# Patient Record
Sex: Female | Born: 1990
Health system: Southern US, Community
[De-identification: ages and names within clinical notes are randomized; demographics above are authoritative.]

## PROBLEM LIST (undated history)

## (undated) ENCOUNTER — Emergency Department (HOSPITAL_COMMUNITY): Admission: EM | Payer: Medicaid Other | Source: Home / Self Care

## (undated) DIAGNOSIS — G479 Sleep disorder, unspecified: Secondary | ICD-10-CM

## (undated) DIAGNOSIS — R569 Unspecified convulsions: Secondary | ICD-10-CM

## (undated) DIAGNOSIS — G40909 Epilepsy, unspecified, not intractable, without status epilepticus: Secondary | ICD-10-CM

## (undated) DIAGNOSIS — G43909 Migraine, unspecified, not intractable, without status migrainosus: Secondary | ICD-10-CM

## (undated) DIAGNOSIS — L700 Acne vulgaris: Secondary | ICD-10-CM

## (undated) DIAGNOSIS — F329 Major depressive disorder, single episode, unspecified: Secondary | ICD-10-CM

## (undated) HISTORY — DX: Major depressive disorder, single episode, unspecified: F32.9

## (undated) HISTORY — DX: Migraine, unspecified, not intractable, without status migrainosus: G43.909

## (undated) HISTORY — DX: Acne vulgaris: L70.0

## (undated) HISTORY — DX: Sleep disorder, unspecified: G47.9

---
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
## 2011-03-19 ENCOUNTER — Inpatient Hospital Stay (INDEPENDENT_AMBULATORY_CARE_PROVIDER_SITE_OTHER)
Admission: RE | Admit: 2011-03-19 | Discharge: 2011-03-19 | Disposition: A | Discharge status: Home / Self Care | Payer: Self-pay | Source: Ambulatory Visit | Attending: Emergency Medicine | Admitting: Emergency Medicine

## 2011-03-19 DIAGNOSIS — K5289 Other specified noninfective gastroenteritis and colitis: Secondary | ICD-10-CM

## 2011-09-14 ENCOUNTER — Emergency Department (INDEPENDENT_AMBULATORY_CARE_PROVIDER_SITE_OTHER)
Admission: EM | Admit: 2011-09-14 | Discharge: 2011-09-14 | Disposition: A | Discharge status: Home / Self Care | Payer: Self-pay | Source: Home / Self Care | Attending: Family Medicine | Admitting: Family Medicine

## 2011-09-14 ENCOUNTER — Encounter (HOSPITAL_COMMUNITY): Payer: Self-pay | Admitting: Cardiology

## 2011-09-14 DIAGNOSIS — R05 Cough: Secondary | ICD-10-CM

## 2011-09-14 DIAGNOSIS — J020 Streptococcal pharyngitis: Secondary | ICD-10-CM

## 2011-09-14 MED ORDER — FEXOFENADINE-PSEUDOEPHED ER 180-240 MG PO TB24: 1.0000 | ORAL_TABLET | Freq: Every day | ORAL | Status: DC

## 2011-09-14 MED ORDER — PENICILLIN G BENZATHINE 1200000 UNIT/2ML IM SUSP
1.2000 10*6.[IU] | Freq: Once | INTRAMUSCULAR | Status: AC
  Administered 2011-09-14: 1.2 10*6.[IU] via INTRAMUSCULAR

## 2011-09-14 MED ORDER — PENICILLIN G BENZATHINE 1200000 UNIT/2ML IM SUSP
INTRAMUSCULAR | Status: AC
  Filled 2011-09-14: qty 2

## 2011-09-14 NOTE — Discharge Instructions (Signed)
Salt Water Gargle This solution will help make your mouth and throat feel better. HOME CARE INSTRUCTIONS   Mix 1 teaspoon of salt in 8 ounces of warm water.   Gargle with this solution as much or often as you need or as directed. Swish and gargle gently if you have any sores or wounds in your mouth.   Do not swallow this mixture.  Document Released: 04/04/2004 Document Revised: 03/13/2011 Document Reviewed: 08/26/2008 [facility] Patient Information 2012 [place], [place].Strep Infections Streptococcal (strep) infections are caused by streptococcal germs (bacteria). Strep infections are very contagious. Strep infections can occur in:  Ears.   The nose.   The throat.   Sinuses.   Skin.   Blood.   Lungs.   Spinal fluid.   Urine.  Strep throat is the most common bacterial infection in children. The symptoms of a Strep infection usually get better in 2 to 3 days after starting medicine that kills germs (antibiotics). Strep is usually not contagious after 36 to 48 hours of antibiotic treatment. Strep infections that are not treated can cause serious complications. These include gland infections, throat abscess, rheumatic fever and kidney disease. DIAGNOSIS  The diagnosis of strep is made by:  A culture for the strep germ.  TREATMENT  These infections require oral antibiotics for a full 10 days, an antibiotic shot or antibiotics given into the vein (intravenous, IV). HOME CARE INSTRUCTIONS   Be sure to finish all antibiotics even if feeling better.   Only take over-the-counter medicines for pain, discomfort and or fever, as directed by your caregiver.   Close contacts that have a fever, sore throat or illness symptoms should see their caregiver right away.   You or your child may return to work, school or daycare if the fever and pain are better in 2 to 3 days after starting antibiotics.  SEEK MEDICAL CARE IF:   You or your child has an oral temperature above 102 F (38.9 C).     Your baby is older than 3 months with a rectal temperature of 100.5 F (38.1 C) or higher for more than 1 day.   You or your child is not better in 3 days.  SEEK IMMEDIATE MEDICAL CARE IF:   You or your child has an oral temperature above 102 F (38.9 C), not controlled by medicine.   Your baby is older than 3 months with a rectal temperature of 102 F (38.9 C) or higher.   Your baby is 15 months old or younger with a rectal temperature of 100.4 F (38 C) or higher.   There is a spreading rash.   There is difficulty swallowing or breathing.   There is increased pain or swelling.  Document Released: 08/08/2004 Document Revised: 03/13/2011 Document Reviewed: 05/17/2009 [facility] Patient Information 2012 [place], [place].

## 2011-09-14 NOTE — ED Provider Notes (Signed)
History     CSN: [id number]  Arrival date & time 09/14/11  0910   First MD Initiated Contact with Patient 09/14/11 0913      Chief Complaint  Patient presents with  . Sore Throat  . Cough  . Nasal Congestion    (Consider location/radiation/quality/duration/timing/severity/associated sxs/prior treatment) Patient is a 21 y.o. female presenting with pharyngitis and cough. The history is provided by the patient.  Sore Throat This is a new problem. The current episode started more than 2 days ago (Tuesday night). Episode frequency: worse at night while laying down. The problem has been gradually worsening. Pertinent negatives include no chest pain, no abdominal pain, no headaches and no shortness of breath. Exacerbated by: laying down. The symptoms are relieved by nothing. Treatments tried: OTC nyquil  and dayquil. The treatment provided no relief.  Cough This is a new problem. The current episode started more than 2 days ago. The problem has not changed since onset.The cough is productive of purulent sputum. There has been no fever. Associated symptoms include sore throat. Pertinent negatives include no chest pain, no chills, no sweats, no ear congestion, no ear pain, no headaches, no rhinorrhea, no myalgias, no shortness of breath, no wheezing and no eye redness. She is not a smoker. Her past medical history is significant for asthma. Her past medical history does not include bronchitis, pneumonia, bronchiectasis, COPD or emphysema.    History reviewed. No pertinent past medical history.  History reviewed. No pertinent past surgical history.  No family history on file.  History  Substance Use Topics  . Smoking status: Not on file  . Smokeless tobacco: Not on file  . Alcohol Use: Not on file    OB History    Grav Para Term Preterm Abortions TAB SAB Ect Mult Living                  Review of Systems  Constitutional: Negative for chills.  HENT: Positive for sore throat. Negative  for ear pain and rhinorrhea.   Eyes: Negative for redness.  Respiratory: Positive for cough. Negative for shortness of breath and wheezing.   Cardiovascular: Negative for chest pain.  Gastrointestinal: Negative for abdominal pain.  Musculoskeletal: Negative for myalgias.  Neurological: Negative for headaches.  All other systems reviewed and are negative.    Allergies  Review of patient's allergies indicates no known allergies.  Home Medications  No current outpatient prescriptions on file.  BP 113/75  Pulse 85  Temp(Src) 98.4 F (36.9 C) (Oral)  SpO2 100%  LMP 09/04/2011  Physical Exam  Nursing note and vitals reviewed. Constitutional: She is oriented to person, place, and time. She appears well-developed and well-nourished. No distress.  HENT:  Head: Normocephalic and atraumatic.  Right Ear: Tympanic membrane normal.  Left Ear: Tympanic membrane normal.  Nose: Nose normal.  Mouth/Throat: No dental caries. Posterior oropharyngeal edema and posterior oropharyngeal erythema present. No oropharyngeal exudate.  Eyes: Right eye exhibits no discharge. Left eye exhibits no discharge. No scleral icterus.  Neck: Neck supple.  Cardiovascular: Normal rate, regular rhythm and normal heart sounds.   Pulmonary/Chest: Effort normal. No respiratory distress. She has no wheezes. She has rhonchi. She has no rales.  Lymphadenopathy:    She has cervical adenopathy.  Neurological: She is alert and oriented to person, place, and time.  Skin: Skin is warm and dry.  Psychiatric: She has a normal mood and affect.    ED Course  Procedures (including critical care time)  Results for orders placed during the hospital encounter of 09/14/11  POCT RAPID STREP A ([facility]RG CARE ONLY)      Component Value Range   Streptococcus, Group A Screen (Direct) POSITIVE (*) NEGATIVE        MDM          Hassan Rowan, MD 09/14/11 1039

## 2011-09-14 NOTE — ED Notes (Addendum)
Pt states sore throat, nasal congestion and dry cough that started this past Tuesday. Denies fever. Tolerating liquids decreased appetite. Has tried multiple OTC products with no relief.  Theraflu, cough drops, and nyquil.

## 2012-01-06 ENCOUNTER — Emergency Department (HOSPITAL_COMMUNITY): Admission: EM | Admit: 2012-01-06 | Discharge: 2012-01-06 | Payer: Self-pay | Source: Home / Self Care

## 2012-06-19 ENCOUNTER — Emergency Department (HOSPITAL_COMMUNITY): Payer: Medicaid Other

## 2012-06-19 ENCOUNTER — Encounter (HOSPITAL_COMMUNITY): Payer: Self-pay | Admitting: Emergency Medicine

## 2012-06-19 ENCOUNTER — Emergency Department (HOSPITAL_COMMUNITY)
Admission: EM | Admit: 2012-06-19 | Discharge: 2012-06-19 | Disposition: A | Discharge status: Home / Self Care | Payer: Medicaid Other | Attending: Emergency Medicine | Admitting: Emergency Medicine

## 2012-06-19 DIAGNOSIS — F29 Unspecified psychosis not due to a substance or known physiological condition: Secondary | ICD-10-CM | POA: Insufficient documentation

## 2012-06-19 DIAGNOSIS — R569 Unspecified convulsions: Secondary | ICD-10-CM

## 2012-06-19 DIAGNOSIS — R42 Dizziness and giddiness: Secondary | ICD-10-CM | POA: Insufficient documentation

## 2012-06-19 LAB — URINALYSIS, ROUTINE W REFLEX MICROSCOPIC
Protein, ur: 30 mg/dL — AB
Urobilinogen, UA: 0.2 mg/dL (ref 0.0–1.0)

## 2012-06-19 LAB — CBC WITH DIFFERENTIAL/PLATELET
Basophils Absolute: 0 10*3/uL (ref 0.0–0.1)
Eosinophils Absolute: 0 10*3/uL (ref 0.0–0.7)
Eosinophils Relative: 0 % (ref 0–5)
MCH: 32.6 pg (ref 26.0–34.0)
MCV: 96 fL (ref 78.0–100.0)
Platelets: 292 10*3/uL (ref 150–400)
RDW: 12.6 % (ref 11.5–15.5)

## 2012-06-19 LAB — BASIC METABOLIC PANEL
Calcium: 9.9 mg/dL (ref 8.4–10.5)
GFR calc non Af Amer: >90 mL/min (ref >90)
Sodium: 137 mEq/L (ref 135–145)

## 2012-06-19 LAB — GLUCOSE, CAPILLARY: Glucose-Capillary: 97 mg/dL (ref 70–99)

## 2012-06-19 LAB — RAPID URINE DRUG SCREEN, HOSP PERFORMED
Opiates: NOT DETECTED
Tetrahydrocannabinol: POSITIVE — AB

## 2012-06-19 LAB — POCT PREGNANCY, URINE: Preg Test, Ur: NEGATIVE

## 2012-06-19 LAB — URINE MICROSCOPIC-ADD ON

## 2012-06-19 MED ORDER — CEPHALEXIN 500 MG PO CAPS: 500.0000 mg | ORAL_CAPSULE | Freq: Two times a day (BID) | ORAL | Status: DC

## 2012-06-19 MED ORDER — SODIUM CHLORIDE 0.9 % IV SOLN
1000.0000 mg | Freq: Once | INTRAVENOUS | Status: AC
  Administered 2012-06-19: 1000 mg via INTRAVENOUS
  Filled 2012-06-19: qty 10

## 2012-06-19 MED ORDER — DEXTROSE 5 % IV SOLN
1.0000 g | INTRAVENOUS | Status: DC
  Administered 2012-06-19: 1 g via INTRAVENOUS
  Filled 2012-06-19: qty 10

## 2012-06-19 MED ORDER — LEVETIRACETAM 500 MG PO TABS: 500.0000 mg | ORAL_TABLET | Freq: Two times a day (BID) | ORAL | Status: DC

## 2012-06-19 NOTE — ED Notes (Signed)
PT presents to ED via EMS after having witnessed seizure. Family witnessed seizure, full body, incont of urine. NAD.

## 2012-06-19 NOTE — Progress Notes (Signed)
EEG COMPLETED

## 2012-06-19 NOTE — Consult Note (Addendum)
Reason for Consult:Seizure Referring Physician: Bernette Mayers  CC: Seizure  HPI: Lisa Shaw is an 21 y.o. female that reports that over the past few months has had episodes where she just stares off into space.  She knows when she has an episode because she gets a warning that precedes the event every time.  This premonitory symptom is the same every time.  Prior to a few months ago she does not recall ever having these episodes.  Patient unable to recall the events of this morning but it seems that not long after awakening she experienced a generalized tonic-clonic seizure.  There was associated tongue biting and urinary incontinence.  Patient was brought to the ED for further evaluation.   Patient reports no history of head trauma, meningitis or encephalitis.  No prior history of seizures.    Past medical history: Noncontributory   Past surgical history: None  Family history: maternal great grandmother with a history of seizures.  Now deceased.    Social History:  Patient admits to no history of alcohol, tobacco or illicit drug abuse.  Drug screen positive for THC.  Works as a Conservation officer, nature.  No Known Allergies  Medications: None at home  ROS: History obtained from the patient  General ROS: negative for - chills, fatigue, fever, night sweats, weight gain or weight loss Psychological ROS: negative for - behavioral disorder, hallucinations, memory difficulties, mood swings or suicidal ideation Ophthalmic ROS: negative for - blurry vision, double vision, eye pain or loss of vision ENT ROS: negative for - epistaxis, nasal discharge, oral lesions, sore throat, tinnitus or vertigo Allergy and Immunology ROS: negative for - hives or itchy/watery eyes Hematological and Lymphatic ROS: negative for - bleeding problems, bruising or swollen lymph nodes Endocrine ROS: negative for - galactorrhea, hair pattern changes, polydipsia/polyuria or temperature intolerance Respiratory ROS: negative for -  cough, hemoptysis, shortness of breath or wheezing Cardiovascular ROS: negative for - chest pain, dyspnea on exertion, edema or irregular heartbeat Gastrointestinal ROS: negative for - abdominal pain, diarrhea, hematemesis, nausea/vomiting or stool incontinence Genito-Urinary ROS: negative for - dysuria, hematuria, incontinence or urinary frequency/urgency Musculoskeletal ROS: negative for - joint swelling or muscular weakness Neurological ROS: as noted in HPI Dermatological ROS: negative for rash and skin lesion changes  Physical Examination: Blood pressure 107/66, pulse 82, temperature 98.9 F (37.2 C), temperature source Oral, resp. rate 22, height 5\' 6"  (1.676 m), last menstrual period 05/21/2012, SpO2 100.00%.  Neurologic Examination Mental Status: Alert, oriented, thought content appropriate.  Speech fluent without evidence of aphasia.  Able to follow 3 step commands without difficulty. Cranial Nerves: II: Discs flat bilaterally; Visual fields grossly normal, pupils equal, round, reactive to light and accommodation III,IV, VI: ptosis not present, extra-ocular motions intact bilaterally V,VII: smile symmetric, facial light touch sensation normal bilaterally VIII: hearing normal bilaterally IX,X: gag reflex present XI: bilateral shoulder shrug XII: midline tongue extension Motor: Right : Upper extremity   5/5    Left:     Upper extremity   5/5  Lower extremity   5/5     Lower extremity   5/5 Tone and bulk:normal tone throughout; no atrophy noted Sensory: Pinprick and light touch intact throughout, bilaterally Deep Tendon Reflexes: 1+ and symmetric throughout Plantars: Right: downgoing   Left: downgoing Cerebellar: normal finger-to-nose, normal rapid alternating movements and normal heel-to-shin test Gait: not tested CV: pulses palpable throughout   Laboratory Studies:   Basic Metabolic Panel:  Lab 06/19/12 1610  NA 137  K 4.0  CL 101  CO2 25  GLUCOSE 89  BUN 8   CREATININE 0.71  CALCIUM 9.9  MG 2.1  PHOS --    Liver Function Tests: No results found for this basename: AST:5,ALT:5,ALKPHOS:5,BILITOT:5,PROT:5,ALBUMIN:5 in the last 168 hours No results found for this basename: LIPASE:5,AMYLASE:5 in the last 168 hours No results found for this basename: AMMONIA:3 in the last 168 hours  CBC:  Lab 06/19/12 0828  WBC 6.2  NEUTROABS 4.2  HGB 13.9  HCT 41.0  MCV 96.0  PLT 292    Cardiac Enzymes: No results found for this basename: CKTOTAL:5,CKMB:5,CKMBINDEX:5,TROPONINI:5 in the last 168 hours  BNP: No components found with this basename: POCBNP:5  CBG:  Lab 06/19/12 1222 06/19/12 0905  GLUCAP 86 97    Microbiology: No results found for this or any previous visit.  Coagulation Studies: No results found for this basename: LABPROT:5,INR:5 in the last 72 hours  Urinalysis:  Lab 06/19/12 0912  COLORURINE YELLOW  LABSPEC 1.018  PHURINE 6.0  GLUCOSEU NEGATIVE  HGBUR TRACE*  BILIRUBINUR NEGATIVE  KETONESUR NEGATIVE  PROTEINUR 30*  UROBILINOGEN 0.2  NITRITE POSITIVE*  LEUKOCYTESUR LARGE*    Lipid Panel:  No results found for this basename: chol, trig, hdl, cholhdl, vldl, ldlcalc    HgbA1C:  No results found for this basename: HGBA1C    Urine Drug Screen:     Component Value Date/Time   LABOPIA NONE DETECTED 06/19/2012 0912   COCAINSCRNUR NONE DETECTED 06/19/2012 0912   LABBENZ NONE DETECTED 06/19/2012 0912   AMPHETMU NONE DETECTED 06/19/2012 0912   THCU POSITIVE* 06/19/2012 0912   LABBARB NONE DETECTED 06/19/2012 0912    Alcohol Level: No results found for this basename: ETH:2 in the last 168 hours  Imaging: Ct Head Wo Contrast  06/19/2012  *RADIOLOGY REPORT*  Clinical Data: Seizure  CT HEAD WITHOUT CONTRAST  Technique:  Contiguous axial images were obtained from the base of the skull through the vertex without contrast.  Comparison: None.  Findings:  The ventricles are rather prominent diffusely for age. Sulci appear  normal.  There is no mass, hemorrhage, extra-axial fluid collection, or midline shift.  Gray-white compartments are normal.  Bony calvarium appears intact.  The mastoid air cells are clear.  IMPRESSION: Overall, the ventricles are rather prominent for age.  Study otherwise unremarkable.   Original Report Authenticated By: Bretta Bang, M.D.      Assessment/Plan: 21 year old female presenting after a generalized T/C seizure associated with tongue biting and urinary incontinence.  Head CT unremarkable.  Exam nonfocal.  It does seem that the patient has been having seizures even prior to now and reports a frequency of about 2-3 per week.  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  She will require treatment and further work up.    Recommendations: 1.  EEG today 2.  Keppra 1000mg  now.  To be placed on a maintenance of 500mg  BID at discharge 3.  MRI of the brain without contrast 4.  Follow up with outpatient neurologist.  Patient may be discharged from the ED to home after above testing.  Patient unable to drive, operate heavy machinery, perform activities at heights and participate in water activities until release by outpatient physician.  Patient given some contraceptive counseling as well.  Thana Farr, MD [facility] [phone] 06/19/2012, 2:12 PM   Addendum: EEG reviewed and shows focal left frontal sharp transients.   Will continue with above plan.  Thana Farr, MD [facility] [phone]

## 2012-06-19 NOTE — ED Notes (Signed)
Patient transported to MRI 

## 2012-06-19 NOTE — ED Provider Notes (Signed)
Patient discussed with Dr. Rhunette Croft.  Patient has already been seen and evaluated by neurology for seizures, with recommendation for keppra 500 mg bid and out-patient neurology follow-up.  Patient currently awake, well-oriented, neuro exam grossly normal.  Treatment plan discussed with patient and family.  Jimmye Norman, NP 06/19/12 [phone]

## 2012-06-19 NOTE — ED Notes (Signed)
Pt returned from MRI °

## 2012-06-19 NOTE — ED Provider Notes (Addendum)
History     CSN: [id number]  Arrival date & time 06/19/12  4782   First MD Initiated Contact with Patient 06/19/12 0802      Chief Complaint  Patient presents with  . Seizures    (Consider location/radiation/quality/duration/timing/severity/associated sxs/prior treatment) HPI Comments: Patient presents with witnessed seizure this morning. Seizure occurred shortly after 6:00 AM. Patient remembers waking up and looking at the clock. Her next memory is on an ambulance. Patient was seen to have a full-body shaking seizure. She did bite her tongue and was incontinent of urine. She was very confused after waking up. Patient does not have a history of seizures as a child or adult. Over the past 6 months the patient has had spells approximately once a week where she blanked out and sometimes will have twitching of her hand or arm. She has not sought evaluation for this. Otherwise her health is good. She denies fevers or neck pain. She states that she has migraine headaches that are unilateral approximately twice a week. She had a typical migraine 2 days ago. She denies cold symptoms, chest pain, abdominal pain, shortness of breath, nausea, vomiting, diarrhea, skin changes, urinary symptoms. She did not hit her head recently. History of seizures in a great aunt. She denies drug or alcohol use. Occasional cigarette. Onset acute. Course is resolved. Nothing makes symptoms better or worse.  The history is provided by the patient.    History reviewed. No pertinent past medical history.  History reviewed. No pertinent past surgical history.  History reviewed. No pertinent family history.  History  Substance Use Topics  . Smoking status: Not on file  . Smokeless tobacco: Not on file  . Alcohol Use: Not on file    OB History    Grav Para Term Preterm Abortions TAB SAB Ect Mult Living                  Review of Systems  Constitutional: Negative for fever and fatigue.  HENT: Negative for neck  pain and tinnitus.   Eyes: Negative for photophobia, pain and visual disturbance.  Respiratory: Negative for shortness of breath.   Cardiovascular: Negative for chest pain.  Gastrointestinal: Negative for nausea and vomiting.  Musculoskeletal: Negative for back pain and gait problem.  Skin: Negative for wound.  Neurological: Positive for seizures and headaches. Negative for dizziness, weakness, light-headedness and numbness.  Psychiatric/Behavioral: Positive for confusion. Negative for decreased concentration.    Allergies  Review of patient's allergies indicates no known allergies.  Home Medications  No current outpatient prescriptions on file.  BP 121/63  Pulse 103  Temp 98.9 F (37.2 C) (Oral)  Resp 18  Ht 5\' 6"  (1.676 m)  SpO2 100%  Physical Exam  Nursing note and vitals reviewed. Constitutional: She is oriented to person, place, and time. She appears well-developed and well-nourished.  HENT:  Head: Normocephalic and atraumatic. Head is without raccoon's eyes and without Battle's sign.  Right Ear: Tympanic membrane, external ear and ear canal normal. No hemotympanum.  Left Ear: Tympanic membrane, external ear and ear canal normal. No hemotympanum.  Nose: Nose normal. No nasal septal hematoma.  Mouth/Throat: Uvula is midline, oropharynx is clear and moist and mucous membranes are normal. Lacerations (lateral tongue abraded bilaterally ) present.  Eyes: Conjunctivae normal, EOM and lids are normal. Pupils are equal, round, and reactive to light. Right eye exhibits no nystagmus. Left eye exhibits no nystagmus.  Neck: Normal range of motion. Neck supple.  Cardiovascular: Normal rate and  regular rhythm.   Pulmonary/Chest: Effort normal and breath sounds normal.  Abdominal: Soft. There is no tenderness.  Musculoskeletal:       Cervical back: She exhibits normal range of motion, no tenderness and no bony tenderness.       Thoracic back: She exhibits no tenderness and no bony  tenderness.       Lumbar back: She exhibits no tenderness and no bony tenderness.  Neurological: She is alert and oriented to person, place, and time. She has normal strength and normal reflexes. No cranial nerve deficit or sensory deficit. She exhibits normal muscle tone. She displays a negative Romberg sign. She displays no seizure activity. Coordination normal. GCS eye subscore is 4. GCS verbal subscore is 5. GCS motor subscore is 6.  Skin: Skin is warm and dry.  Psychiatric: She has a normal mood and affect.    ED Course  Procedures (including critical care time)  Labs Reviewed  URINALYSIS, ROUTINE W REFLEX MICROSCOPIC - Abnormal; Notable for the following:    APPearance CLOUDY (*)     Hgb urine dipstick TRACE (*)     Protein, ur 30 (*)     Nitrite POSITIVE (*)     Leukocytes, UA LARGE (*)     All other components within normal limits  URINE RAPID DRUG SCREEN (HOSP PERFORMED) - Abnormal; Notable for the following:    Tetrahydrocannabinol POSITIVE (*)     All other components within normal limits  URINE MICROSCOPIC-ADD ON - Abnormal; Notable for the following:    Squamous Epithelial / LPF FEW (*)     Bacteria, UA MANY (*)     All other components within normal limits  CBC WITH DIFFERENTIAL  BASIC METABOLIC PANEL  MAGNESIUM  GLUCOSE, CAPILLARY  POCT PREGNANCY, URINE  URINE CULTURE   Ct Head Wo Contrast  06/19/2012  *RADIOLOGY REPORT*  Clinical Data: Seizure  CT HEAD WITHOUT CONTRAST  Technique:  Contiguous axial images were obtained from the base of the skull through the vertex without contrast.  Comparison: None.  Findings:  The ventricles are rather prominent diffusely for age. Sulci appear normal.  There is no mass, hemorrhage, extra-axial fluid collection, or midline shift.  Gray-white compartments are normal.  Bony calvarium appears intact.  The mastoid air cells are clear.  IMPRESSION: Overall, the ventricles are rather prominent for age.  Study otherwise unremarkable.    Original Report Authenticated By: Bretta Bang, M.D.      1. Seizure     8:23 AM Patient seen and examined. Work-up initiated. D/w Dr. Bernette Mayers.  Vital signs reviewed and are as follows: Filed Vitals:   06/19/12 0759  BP: 121/63  Pulse: 103  Temp: 98.9 F (37.2 C)  Resp: 18   10:43 AM Spoke with Dr. Thad Ranger who has asked for EEG and will try to arrange prior to discharge.   11:06 AM Will move to CDU pending EEG results.   Handoff Pisciotta PA-C. Plan: Follow up with neuro reccs, likely d/c on lamical vs keppra, info for f/u with [facility], treat UTI.   Date: 06/19/2012  Rate: 102  Rhythm: sinus tachycardia  QRS Axis: normal  Intervals: normal  ST/T Wave abnormalities: nonspecific ST/T changes  Conduction Disutrbances:none  Narrative Interpretation:   Old EKG Reviewed: none available    MDM  Probable seizure disorder. Will move to CDU pending work-up results.         Renne Crigler, [place] 06/19/12 1110  [place], [place] 06/19/12 1231

## 2012-06-19 NOTE — ED Provider Notes (Signed)
Medical screening examination/treatment/procedure(s) were performed by non-physician practitioner and as supervising physician I was immediately available for consultation/collaboration.   Kato Wieczorek B. Bernette Mayers, MD 06/19/12 1110

## 2012-06-19 NOTE — Procedures (Signed)
ELECTROENCEPHALOGRAM REPORT   Patient: Lisa Shaw       Room #: ED EEG No. ID: [id number] Age: 21 y.o.        Sex: female Referring Physician: [place] Report Date:  06/19/2012        Interpreting Physician: Thana Farr D  History: Lisa Shaw is an 21 y.o. female with new onset seizures  Medications: None  Conditions of Recording:  This is a 16 channel EEG carried out with the patient in the awake, drowsy and asleep states.  Description:  The waking background activity consists of a low voltage, symmetrical, fairly well organized, 10 Hz alpha activity, seen from the parieto-occipital and posterior temporal regions.  Low voltage fast activity, poorly organized, is seen anteriorly and is at times superimposed on more posterior regions.  A mixture of theta and alpha rhythms are seen from the central and temporal regions.  Infrequent sharp transients are noted in the left fronto-temporal region. The patient drowses with slowing to irregular, low voltage theta and beta activity.   The patient goes in to a light sleep with symmetrical sleep spindles, vertex central sharp transients and irregular slow activity.  Hyperventilation produced a mild to moderate buildup but failed to elicit any abnormalities.  Intermittent photic stimulation was performed and elicits a symmetrical driving response but fails to elicit any abnormalities.  IMPRESSION: This is an abnormal EEG secondary to focal sharp wave activity in the frontal region on the left.  This finding is suggestive of a focal disturbance with epileptogenic potential.   Thana Farr, MD [facility] [phone] 06/19/2012, 10:34 PM

## 2012-06-19 NOTE — ED Provider Notes (Signed)
Lisa Shaw is a 21 y.o. female transferred to CDU from [facility] A. Signout from [employer] as follows: Patient had onset was witnessed and appear to be a legitimate gram also the seizure. Neurology was consult to Dr. Thad Ranger has ordered an expedited EEG. Plan is to followup the EEG with Dr. Thad Ranger and possibly DC her on Keppra or Lamictal at neurology's recommendation. She will follow with [facility] is an outpatient. Incidentally patient also has a UTI. Patient will be given 1 g IV Rocephin for that.  Pt seen and examined at the bedside she has no complaints and says that she feels "back to normal." Pupils are equal round and reactive to light. Strength is 5 out of 5x4 extremities. Heart is regular rate and rhythm with no murmurs rubs and gallops, lungs are clear to auscultation bilaterally and abdominal exam is benign with mild tenderness to palpation of suprapubic region no rebound or masses.  Patient will be drove given a gram of Rocephin for her UTI.  Neurologic consult from Dr. Thad Ranger appreciated: EEG shows abnormalities that must be followed up with MRI. She will load with oral Keppra and recommends starting Keppra 500 twice a day at discharge. She will need outpatient neurologic care [facility].  Signout given to NP Katrinka Blazing at shift change.     Wynetta Emery, PA-C 06/19/12 1524

## 2012-06-19 NOTE — ED Notes (Signed)
Lunch tray ordered for pt.

## 2012-06-19 NOTE — ED Provider Notes (Signed)
Medical screening examination/treatment/procedure(s) were performed by non-physician practitioner and as supervising physician I was immediately available for consultation/collaboration.   Arcelia Pals B. Bernette Mayers, MD 06/19/12 1601

## 2012-06-19 NOTE — ED Notes (Signed)
Post EEG patient became disoriented and diaphoretic for apprx 1-2 minutes. She was slow in her responses, she was confused during that time. She then was oriented but was still slow in her responses.

## 2012-06-20 NOTE — ED Provider Notes (Signed)
Medical screening examination/treatment/procedure(s) were conducted as a shared visit with non-physician practitioner(s) and myself.  I personally evaluated the patient during the encounter  Newell Frater, MD 06/20/12 0117 

## 2012-06-21 LAB — URINE CULTURE: Colony Count: >=100000

## 2012-06-22 NOTE — ED Notes (Signed)
+   urine Patient treated with keflex-sensitive to same-chart appended per protocol MD. 

## 2012-07-24 ENCOUNTER — Emergency Department (HOSPITAL_COMMUNITY)
Admission: EM | Admit: 2012-07-24 | Discharge: 2012-07-24 | Disposition: A | Discharge status: Home / Self Care | Payer: Medicaid Other | Attending: Emergency Medicine | Admitting: Emergency Medicine

## 2012-07-24 ENCOUNTER — Encounter (HOSPITAL_COMMUNITY): Payer: Self-pay | Admitting: Emergency Medicine

## 2012-07-24 DIAGNOSIS — F17200 Nicotine dependence, unspecified, uncomplicated: Secondary | ICD-10-CM | POA: Insufficient documentation

## 2012-07-24 DIAGNOSIS — G40909 Epilepsy, unspecified, not intractable, without status epilepticus: Secondary | ICD-10-CM | POA: Insufficient documentation

## 2012-07-24 DIAGNOSIS — Z9119 Patient's noncompliance with other medical treatment and regimen: Secondary | ICD-10-CM | POA: Insufficient documentation

## 2012-07-24 DIAGNOSIS — G4700 Insomnia, unspecified: Secondary | ICD-10-CM | POA: Insufficient documentation

## 2012-07-24 DIAGNOSIS — R51 Headache: Secondary | ICD-10-CM | POA: Insufficient documentation

## 2012-07-24 DIAGNOSIS — Z91199 Patient's noncompliance with other medical treatment and regimen due to unspecified reason: Secondary | ICD-10-CM | POA: Insufficient documentation

## 2012-07-24 DIAGNOSIS — R569 Unspecified convulsions: Secondary | ICD-10-CM

## 2012-07-24 DIAGNOSIS — Z3202 Encounter for pregnancy test, result negative: Secondary | ICD-10-CM | POA: Insufficient documentation

## 2012-07-24 HISTORY — DX: Unspecified convulsions: R56.9

## 2012-07-24 LAB — URINALYSIS, ROUTINE W REFLEX MICROSCOPIC
Bilirubin Urine: NEGATIVE
Glucose, UA: NEGATIVE mg/dL
Hgb urine dipstick: NEGATIVE
Ketones, ur: NEGATIVE mg/dL
Specific Gravity, Urine: 1.015 (ref 1.005–1.030)
pH: 7 (ref 5.0–8.0)

## 2012-07-24 LAB — POCT PREGNANCY, URINE: Preg Test, Ur: NEGATIVE

## 2012-07-24 LAB — COMPREHENSIVE METABOLIC PANEL
AST: 14 U/L (ref 0–37)
Albumin: 3.9 g/dL (ref 3.5–5.2)
Alkaline Phosphatase: 81 U/L (ref 39–117)
BUN: 5 mg/dL — ABNORMAL LOW (ref 6–23)
CO2: 25 mEq/L (ref 19–32)
Chloride: 101 mEq/L (ref 96–112)
Creatinine, Ser: 0.58 mg/dL (ref 0.50–1.10)
GFR calc non Af Amer: >90 mL/min (ref >90)
Potassium: 3.7 mEq/L (ref 3.5–5.1)
Total Bilirubin: 0.3 mg/dL (ref 0.3–1.2)

## 2012-07-24 LAB — CBC WITH DIFFERENTIAL/PLATELET
Eosinophils Relative: 0 % (ref 0–5)
HCT: 39.1 % (ref 36.0–46.0)
Hemoglobin: 13.5 g/dL (ref 12.0–15.0)
Lymphocytes Relative: 41 % (ref 12–46)
Lymphs Abs: 2.8 10*3/uL (ref 0.7–4.0)
MCV: 94.7 fL (ref 78.0–100.0)
Monocytes Absolute: 0.5 10*3/uL (ref 0.1–1.0)
Monocytes Relative: 7 % (ref 3–12)
Neutro Abs: 3.6 10*3/uL (ref 1.7–7.7)
RBC: 4.13 MIL/uL (ref 3.87–5.11)
RDW: 12.6 % (ref 11.5–15.5)
WBC: 6.9 10*3/uL (ref 4.0–10.5)

## 2012-07-24 LAB — RAPID URINE DRUG SCREEN, HOSP PERFORMED
Barbiturates: NOT DETECTED
Cocaine: NOT DETECTED
Opiates: NOT DETECTED
Tetrahydrocannabinol: POSITIVE — AB

## 2012-07-24 MED ORDER — LEVETIRACETAM 500 MG PO TABS: 500.0000 mg | ORAL_TABLET | Freq: Two times a day (BID) | ORAL | Status: DC

## 2012-07-24 MED ORDER — SODIUM CHLORIDE 0.9 % IV SOLN
1000.0000 mg | Freq: Once | INTRAVENOUS | Status: AC
  Administered 2012-07-24: 1000 mg via INTRAVENOUS
  Filled 2012-07-24: qty 10

## 2012-07-24 NOTE — ED Provider Notes (Signed)
History     CSN: [id number]  Arrival date & time 07/24/12  1128   First MD Initiated Contact with Patient 07/24/12 1222      Chief Complaint  Patient presents with  . Seizures    (Consider location/radiation/quality/duration/timing/severity/associated sxs/prior treatment) HPI Comments: Lisa Shaw is a 22 year old female w a PMHx of TC seizures who presented with a complaint of unwittnessed seizure last night.  At approximately 1:40 in the morning she awoke and her finger was twitching and she felt weird.  After this episode, she fell back asleep and awoke again at 5 in the morning with a headache.  She denies any urinary incontinence or tongue biting.  She doesn't remember the episode very well, and LOC is unknown. In addition pt reports difficulty sleeping adn night time insomnia x 1 week, which is new.  In December she was diagnosed with TC seizures (abnormal EEG, no findings on MRI per Dr. Thad Ranger)  and prescribed Keppra.  Last Sunday she ran out of her Keppra prescription and was not able to refill it due to waiting on her Medicaid card. Pt was to f-u with [facility], but to ins issues has no yet seen them, apt scheduled for Feb. 12th.  She has not taken Keppra since Sunday.  She has no other medical problems and has had no recent illness or trauma.  She denies any drug use or any recent exposure to toxins.  She denies pregnancy.    Patient is a 22 y.o. female presenting with seizures. The history is provided by the patient and medical records.  Seizures     Past Medical History  Diagnosis Date  . Seizures     History reviewed. No pertinent past surgical history.  No family history on file.  History  Substance Use Topics  . Smoking status: Current Every Day Smoker -- 3.0 packs/day  . Smokeless tobacco: Not on file  . Alcohol Use: No    OB History    Grav Para Term Preterm Abortions TAB SAB Ect Mult Living                  Review of Systems    Neurological: Positive for seizures.  All other systems reviewed and are negative.    Allergies  Review of patient's allergies indicates no known allergies.  Home Medications   Current Outpatient Rx  Name  Route  Sig  Dispense  Refill  . LEVETIRACETAM 500 MG PO TABS   Oral   Take 1 tablet (500 mg total) by mouth every 12 (twelve) hours.   60 tablet   0     BP 101/61  Pulse 94  Temp 99 F (37.2 C) (Oral)  Resp 18  SpO2 100%  LMP 06/25/2012  Physical Exam  Nursing note and vitals reviewed. Constitutional: She appears well-developed and well-nourished. No distress.       Not post ictal, normotensive  HENT:  Head: Normocephalic.       Atraumatic, No evidence of tongue oral lacerations  Eyes: EOM are normal. Pupils are equal, round, and reactive to light.  Neck: Normal range of motion. Neck supple.       Cervical spinous process non tender without step offs, no difficulty or pain with flexion or extension of neck  Cardiovascular: Normal rate, regular rhythm, normal heart sounds and intact distal pulses.   Pulmonary/Chest: Breath sounds normal. No respiratory distress. She has no wheezes. She has no rales.  Abdominal: Soft. There is  no tenderness.  Musculoskeletal: She exhibits no edema and no tenderness.       Full active & passive ROM of arms bilaterally  Neurological:       CN III-VII intact. Iintact coordination, sensation, and motor (finger grip, biceps, hamstrings & dorsiflexion). No pass pointing, good rapid coordination. Gait normal.   Skin: Skin is warm and dry. She is not diaphoretic.       intact    ED Course  Procedures (including critical care time)  Labs Reviewed  URINALYSIS, ROUTINE W REFLEX MICROSCOPIC - Abnormal; Notable for the following:    APPearance CLOUDY (*)     All other components within normal limits  COMPREHENSIVE METABOLIC PANEL - Abnormal; Notable for the following:    BUN 5 (*)     All other components within normal limits  CBC WITH  DIFFERENTIAL  ETHANOL  POCT PREGNANCY, URINE  URINE RAPID DRUG SCREEN (HOSP PERFORMED)   No results found.   No diagnosis found.  The patient denies any neck pain. There is no tenderness on palpation of the cervical spine and no step-offs. The patient can look to the left and right voluntarily without pain and flex and extend the neck without pain. Cervical collar cleared.    MDM  Seizure, medication noncompliance  Patient is a 22 year old female that presents emergency department after having a seizure last evening.  She reports one week of medication noncompliance do to inability to followup with [facility] neurology until February 12.  No focal neuro deficits seen on exam.  Cervical spine cleared.  Labs reviewed without acute abnormalities and no imaging indicated at this time.  Patient has been advised to keep her followup appointment that has been scheduled with [facility] neurology.  Keppra loading dose given while in the emergency department and patient will be discharged with prescription.  Strict return precautions discussed.  At this time there does not appear to be any evidence of an acute emergency medical condition and the patient appears stable for discharge with appropriate outpatient follow up. Diagnosis was discussed with patient who verbalizes understanding and is agreeable to discharge.          Jaci Carrel, [place] 07/24/12 1411

## 2012-07-24 NOTE — ED Notes (Signed)
Pt presenting to ed with c/o seizure last night pt states she has been out of her medications x 1 week pt states she has been waiting on her medicaid card.

## 2012-07-28 NOTE — ED Provider Notes (Signed)
Medical screening examination/treatment/procedure(s) were performed by non-physician practitioner and as supervising physician I was immediately available for consultation/collaboration.  Gilda Crease, MD 07/28/12 [phone]

## 2012-12-02 ENCOUNTER — Emergency Department (HOSPITAL_COMMUNITY)
Admission: EM | Admit: 2012-12-02 | Discharge: 2012-12-02 | Disposition: A | Discharge status: Home / Self Care | Payer: Medicaid Other | Source: Home / Self Care

## 2012-12-02 ENCOUNTER — Encounter (HOSPITAL_COMMUNITY): Payer: Self-pay | Admitting: Emergency Medicine

## 2012-12-02 DIAGNOSIS — K5289 Other specified noninfective gastroenteritis and colitis: Secondary | ICD-10-CM

## 2012-12-02 DIAGNOSIS — K529 Noninfective gastroenteritis and colitis, unspecified: Secondary | ICD-10-CM

## 2012-12-02 HISTORY — DX: Epilepsy, unspecified, not intractable, without status epilepticus: G40.909

## 2012-12-02 MED ORDER — ONDANSETRON 4 MG PO TBDP: 4.0000 mg | ORAL_TABLET | Freq: Three times a day (TID) | ORAL | Status: DC | PRN

## 2012-12-02 MED ORDER — ONDANSETRON 4 MG PO TBDP: 4.0000 mg | ORAL_TABLET | Freq: Four times a day (QID) | ORAL | Status: DC | PRN

## 2012-12-02 NOTE — ED Provider Notes (Signed)
History     CSN: [id number]  Arrival date & time 12/02/12  1652   None     Chief Complaint  Patient presents with  . Influenza    vomiting and diarrhea since 5/19 denies fever.     (Consider location/radiation/quality/duration/timing/severity/associated sxs/prior treatment) HPI Comments: Pt presents c/o NVD and cramping abdominal pain since Monday 2 days ago.  Started with diarrhea and progressed to N/V.  She though she was getting better but threw up today after eating lunch so decided to come in.  She also states she started feeling a little bit dizzy today and that she is still feeling dizzy now.  She has not taken anything to help her symptoms.  Denies any sick contacts.  No fever, chills, SOB.    Patient is a 21 y.o. female presenting with flu symptoms.  Influenza Presenting symptoms: fatigue     Past Medical History  Diagnosis Date  . Seizures   . Epilepsy     History reviewed. No pertinent past surgical history.  History reviewed. No pertinent family history.  History  Substance Use Topics  . Smoking status: Current Every Day Smoker -- 3.00 packs/day  . Smokeless tobacco: Not on file  . Alcohol Use: No    OB History   Grav Para Term Preterm Abortions TAB SAB Ect Mult Living                  Review of Systems  Constitutional: Positive for fatigue.  HENT: Negative.   Eyes: Negative.   Respiratory: Negative.   Cardiovascular: Negative.   Gastrointestinal: Negative.        See HPI  Endocrine: Negative.   Genitourinary: Negative.   Musculoskeletal: Negative.   Skin: Negative.   Allergic/Immunologic: Negative.   Neurological: Positive for weakness and light-headedness. Negative for seizures, syncope and numbness.  Hematological: Negative.   Psychiatric/Behavioral: Negative.     Allergies  Review of patient's allergies indicates no known allergies.  Home Medications   Current Outpatient Rx  Name  Route  Sig  Dispense  Refill  . levETIRAcetam  (KEPPRA) 500 MG tablet   Oral   Take 1 tablet (500 mg total) by mouth every 12 (twelve) hours.   90 tablet   0   . ondansetron (ZOFRAN-ODT) 4 MG disintegrating tablet   Oral   Take 1 tablet (4 mg total) by mouth every 6 (six) hours as needed for nausea.   12 tablet   0     BP 123/71  Pulse 105  Temp(Src) 98.6 F (37 C) (Oral)  Resp 22  SpO2 100%  LMP 11/12/2012  Physical Exam  Nursing note and vitals reviewed. Constitutional: She is oriented to person, place, and time. Vital signs are normal. She appears well-developed and well-nourished. No distress.  Cardiovascular: Normal rate, regular rhythm and normal heart sounds.  Exam reveals no gallop and no friction rub.   No murmur heard. Pulmonary/Chest: Effort normal and breath sounds normal. No respiratory distress. She has no wheezes. She has no rales. She exhibits no tenderness.  Abdominal: Soft. Normal appearance and bowel sounds are normal. She exhibits no distension. There is no hepatosplenomegaly. There is tenderness in the right upper quadrant, epigastric area and left upper quadrant. There is no guarding and no CVA tenderness.  Musculoskeletal: Normal range of motion. She exhibits no edema and no tenderness.  Neurological: She is alert and oriented to person, place, and time. She has normal strength. No cranial nerve deficit.  Skin: Skin  is warm and dry. No rash noted. She is not diaphoretic. No erythema. No pallor.  Psychiatric: She has a normal mood and affect. Her behavior is normal. Judgment normal.    ED Course  Procedures (including critical care time)  Labs Reviewed - No data to display No results found.   1. Gastroenteritis       MDM  Orthostatic vitals normal.  Will treat her with SL zofran, BRAT diet, push fluids.          Graylon Good, PA-C 12/02/12 1816

## 2012-12-02 NOTE — ED Notes (Signed)
Waiting discharge papers 

## 2012-12-02 NOTE — ED Notes (Signed)
Pt c/o vomiting and diarrhea since 5/19. Decrease/no appetite. Pt denies fever. Pt has not tried any otc meds for symptoms.  Denies any other symptoms.

## 2012-12-03 NOTE — ED Provider Notes (Signed)
Medical screening examination/treatment/procedure(s) were performed by resident physician or non-physician practitioner and as supervising physician I was immediately available for consultation/collaboration.   Dragon Thrush DOUGLAS MD.   Nabila Albarracin D Female Minish, MD 12/03/12 1410 

## 2013-03-07 ENCOUNTER — Encounter (HOSPITAL_COMMUNITY): Payer: Self-pay | Admitting: Emergency Medicine

## 2013-03-07 ENCOUNTER — Emergency Department (HOSPITAL_COMMUNITY)
Admission: EM | Admit: 2013-03-07 | Discharge: 2013-03-07 | Disposition: A | Discharge status: Home / Self Care | Payer: Self-pay | Attending: Emergency Medicine | Admitting: Emergency Medicine

## 2013-03-07 DIAGNOSIS — G40909 Epilepsy, unspecified, not intractable, without status epilepticus: Secondary | ICD-10-CM | POA: Insufficient documentation

## 2013-03-07 DIAGNOSIS — J3489 Other specified disorders of nose and nasal sinuses: Secondary | ICD-10-CM | POA: Insufficient documentation

## 2013-03-07 DIAGNOSIS — F17200 Nicotine dependence, unspecified, uncomplicated: Secondary | ICD-10-CM | POA: Insufficient documentation

## 2013-03-07 DIAGNOSIS — J329 Chronic sinusitis, unspecified: Secondary | ICD-10-CM | POA: Insufficient documentation

## 2013-03-07 DIAGNOSIS — R059 Cough, unspecified: Secondary | ICD-10-CM | POA: Insufficient documentation

## 2013-03-07 DIAGNOSIS — Z79899 Other long term (current) drug therapy: Secondary | ICD-10-CM | POA: Insufficient documentation

## 2013-03-07 DIAGNOSIS — R05 Cough: Secondary | ICD-10-CM | POA: Insufficient documentation

## 2013-03-07 DIAGNOSIS — J029 Acute pharyngitis, unspecified: Secondary | ICD-10-CM | POA: Insufficient documentation

## 2013-03-07 MED ORDER — TRAMADOL HCL 50 MG PO TABS: 50.0000 mg | ORAL_TABLET | Freq: Four times a day (QID) | ORAL | Status: DC | PRN

## 2013-03-07 MED ORDER — AMOXICILLIN 500 MG PO CAPS: 500.0000 mg | ORAL_CAPSULE | Freq: Three times a day (TID) | ORAL | Status: DC

## 2013-03-07 MED ORDER — PREDNISONE 10 MG PO TABS: 60.0000 mg | ORAL_TABLET | Freq: Every day | ORAL | Status: DC

## 2013-03-07 NOTE — ED Notes (Signed)
Pt escorted to discharge window. Pt verbalized understanding discharge instructions. In no acute distress.  

## 2013-03-07 NOTE — ED Notes (Signed)
Pt c/o sore throat since yesterday 

## 2013-03-07 NOTE — ED Provider Notes (Signed)
CSN: [id number]     Arrival date & time 03/07/13  1308 History     First MD Initiated Contact with Patient 03/07/13 [phone]     Chief Complaint  Patient presents with  . Sore Throat   (Consider location/radiation/quality/duration/timing/severity/associated sxs/prior Treatment) HPI Comments: Patient reports that she has had sinus congestion, sore throat and cough for more than a week. Symptoms have progressively worsened since symptoms started, but much worse since yesterday. She reports that she had some amoxicillin tablets left over, started taking them without improvement. She has taken about 7 pills. Patient is not experiencing any shortness of breath.  Patient is a 22 y.o. female presenting with pharyngitis.  Sore Throat    Past Medical History  Diagnosis Date  . Seizures   . Epilepsy    No past surgical history on file. No family history on file. History  Substance Use Topics  . Smoking status: Current Every Day Smoker -- 3.00 packs/day  . Smokeless tobacco: Not on file  . Alcohol Use: No   OB History   Grav Para Term Preterm Abortions TAB SAB Ect Mult Living                 Review of Systems  Constitutional: Negative for fever.  HENT: Positive for congestion and sore throat. Negative for trouble swallowing, neck stiffness and voice change.   Respiratory: Positive for cough.   Gastrointestinal: Negative.   All other systems reviewed and are negative.    Allergies  Review of patient's allergies indicates no known allergies.  Home Medications   Current Outpatient Rx  Name  Route  Sig  Dispense  Refill  . levETIRAcetam (KEPPRA) 500 MG tablet   Oral   Take 1 tablet (500 mg total) by mouth every 12 (twelve) hours.   90 tablet   0   . ondansetron (ZOFRAN-ODT) 4 MG disintegrating tablet   Oral   Take 1 tablet (4 mg total) by mouth every 6 (six) hours as needed for nausea.   12 tablet   0    BP 141/86  Pulse 87  Temp(Src) 98.4 F (36.9 C) (Oral)  Resp 16   SpO2 100% Physical Exam  Constitutional: She is oriented to person, place, and time. She appears well-developed and well-nourished. No distress.  HENT:  Head: Normocephalic and atraumatic.  Right Ear: Hearing normal.  Left Ear: Hearing normal.  Nose: Nose normal.  Mouth/Throat: Oropharynx is clear and moist and mucous membranes are normal.  Eyes: Conjunctivae and EOM are normal. Pupils are equal, round, and reactive to light.  Neck: Normal range of motion. Neck supple.  Cardiovascular: Regular rhythm, S1 normal and S2 normal.  Exam reveals no gallop and no friction rub.   No murmur heard. Pulmonary/Chest: Effort normal and breath sounds normal. No respiratory distress. She exhibits no tenderness.  Abdominal: Soft. Normal appearance and bowel sounds are normal. There is no hepatosplenomegaly. There is no tenderness. There is no rebound, no guarding, no tenderness at McBurney's point and negative Murphy's sign. No hernia.  Musculoskeletal: Normal range of motion.  Neurological: She is alert and oriented to person, place, and time. She has normal strength. No cranial nerve deficit or sensory deficit. Coordination normal. GCS eye subscore is 4. GCS verbal subscore is 5. GCS motor subscore is 6.  Skin: Skin is warm, dry and intact. No rash noted. No cyanosis.  Psychiatric: She has a normal mood and affect. Her speech is normal and behavior is normal. Thought content normal.  ED Course   Procedures (including critical care time)  Labs Reviewed - No data to display No results found.  Diagnosis: 1. Sinusitis 2. Pharyngitis  MDM  Patient presents to the ER for evaluation of sore throat. She has had other cold symptoms concomitantly. Patient does not have any evidence of peritonsillar abscess on exam. She is, however, partially treated with amoxicillin she has initiated. She will therefore be continued on antibiotic coverage. By mouth prednisone for sore throat.  Gilda Crease,  MD 03/07/13 [phone]

## 2013-03-24 ENCOUNTER — Encounter (HOSPITAL_COMMUNITY): Payer: Self-pay | Admitting: Emergency Medicine

## 2013-03-24 ENCOUNTER — Emergency Department (HOSPITAL_COMMUNITY)
Admission: EM | Admit: 2013-03-24 | Discharge: 2013-03-24 | Disposition: A | Discharge status: Home / Self Care | Payer: Self-pay | Attending: Emergency Medicine | Admitting: Emergency Medicine

## 2013-03-24 DIAGNOSIS — G40909 Epilepsy, unspecified, not intractable, without status epilepticus: Secondary | ICD-10-CM | POA: Insufficient documentation

## 2013-03-24 DIAGNOSIS — F17200 Nicotine dependence, unspecified, uncomplicated: Secondary | ICD-10-CM | POA: Insufficient documentation

## 2013-03-24 DIAGNOSIS — R569 Unspecified convulsions: Secondary | ICD-10-CM

## 2013-03-24 DIAGNOSIS — Z3202 Encounter for pregnancy test, result negative: Secondary | ICD-10-CM | POA: Insufficient documentation

## 2013-03-24 DIAGNOSIS — R51 Headache: Secondary | ICD-10-CM | POA: Insufficient documentation

## 2013-03-24 LAB — URINALYSIS, ROUTINE W REFLEX MICROSCOPIC
Bilirubin Urine: NEGATIVE
Ketones, ur: NEGATIVE mg/dL
Leukocytes, UA: NEGATIVE
Nitrite: NEGATIVE
Urobilinogen, UA: 1 mg/dL (ref 0.0–1.0)
pH: 6.5 (ref 5.0–8.0)

## 2013-03-24 LAB — POCT I-STAT, CHEM 8
Calcium, Ion: 1.19 mmol/L (ref 1.12–1.23)
Glucose, Bld: 88 mg/dL (ref 70–99)
HCT: 39 % (ref 36.0–46.0)
Hemoglobin: 13.3 g/dL (ref 12.0–15.0)
TCO2: 21 mmol/L (ref 0–100)

## 2013-03-24 LAB — PREGNANCY, URINE: Preg Test, Ur: NEGATIVE

## 2013-03-24 MED ORDER — LEVETIRACETAM 500 MG PO TABS
1000.0000 mg | ORAL_TABLET | Freq: Once | ORAL | Status: AC
  Administered 2013-03-24: 1000 mg via ORAL
  Filled 2013-03-24: qty 2

## 2013-03-24 MED ORDER — LEVETIRACETAM 1000 MG PO TABS: 1000.0000 mg | ORAL_TABLET | Freq: Two times a day (BID) | ORAL | Status: DC

## 2013-03-24 NOTE — ED Notes (Signed)
Bed: WA23 Expected date:  Expected time:  Means of arrival:  Comments: seizure 

## 2013-03-24 NOTE — Progress Notes (Signed)
[facility] provided patient with a list of primary care resources and a [facility] [employer] Card application.  °

## 2013-03-24 NOTE — ED Provider Notes (Signed)
CSN: [id number]     Arrival date & time 03/24/13  0747 History   First MD Initiated Contact with Patient 03/24/13 [phone]     Chief Complaint  Patient presents with  . Seizures   HPI The patient presents to the emergency room after having a seizure this morning. She has a history of seizure disorder. Patient states she's had 3 in this past year.  Her first one was in December. The second one was in March. She started taking Keppra after her first seizure in December. She saw her neurologist in March and there was no change in her medication regimen. Her husband witnessed the seizure this morning. It lasted less than 1 minute. She seemed to be stretching and also on head and generalized tonic clonic activity. The patient denies any complaints now other having a mild headache and she does feel like her memory is still not clear. She denies any numbness or weakness. She has not skipped any of her medications except the morning dose toda. She continues to take Keppra 500 mg twice daily.  Past Medical History  Diagnosis Date  . Seizures   . Epilepsy    History reviewed. No pertinent past surgical history. No family history on file. History  Substance Use Topics  . Smoking status: Current Every Day Smoker -- 3.00 packs/day  . Smokeless tobacco: Not on file  . Alcohol Use: No   OB History   Grav Para Term Preterm Abortions TAB SAB Ect Mult Living                 Review of Systems  All other systems reviewed and are negative.    Allergies  Review of patient's allergies indicates no known allergies.  Home Medications   Current Outpatient Rx  Name  Route  Sig  Dispense  Refill  . ibuprofen (ADVIL,MOTRIN) 200 MG tablet   Oral   Take 200 mg by mouth every 6 (six) hours as needed for pain.         Marland Kitchen levETIRAcetam (KEPPRA) 1000 MG tablet   Oral   Take 1 tablet (1,000 mg total) by mouth every 12 (twelve) hours.   60 tablet   1    BP 110/65  Pulse 90  Temp(Src) 98.2 F (36.8 C)  (Oral)  Resp 20  SpO2 100% Physical Exam  Nursing note and vitals reviewed. Constitutional: She appears well-developed and well-nourished. No distress.  HENT:  Head: Normocephalic and atraumatic.  Right Ear: External ear normal.  Left Ear: External ear normal.  Eyes: Conjunctivae are normal. Right eye exhibits no discharge. Left eye exhibits no discharge. No scleral icterus.  Neck: Neck supple. No tracheal deviation present.  Cardiovascular: Normal rate, regular rhythm and intact distal pulses.   Pulmonary/Chest: Effort normal and breath sounds normal. No stridor. No respiratory distress. She has no wheezes. She has no rales.  Abdominal: Soft. Bowel sounds are normal. She exhibits no distension. There is no tenderness. There is no rebound and no guarding.  Musculoskeletal: She exhibits no edema and no tenderness.  Neurological: She is alert. She has normal strength. No sensory deficit. Cranial nerve deficit:  no gross defecits noted. She exhibits normal muscle tone. She displays no seizure activity. Coordination normal. GCS eye subscore is 4. GCS verbal subscore is 5. GCS motor subscore is 6.  Skin: Skin is warm and dry. No rash noted.  Psychiatric: She has a normal mood and affect.    ED Course  Procedures (including critical care  time) Labs Review Labs Reviewed  POCT I-STAT, CHEM 8 - Abnormal; Notable for the following:    BUN <3 (*)    All other components within normal limits  PREGNANCY, URINE  URINALYSIS, ROUTINE W REFLEX MICROSCOPIC   Imaging Review No results found.  MDM   1. Seizure    Pt with history of seizures.  Missed her morning dose however she had the seizure prior to that.  Discussed with neurology.  Will increase keppra to 1000 mg BID.  Pt has follow up with her neurologist this month.    Celene Kras, MD 03/24/13 1018

## 2013-03-24 NOTE — ED Notes (Signed)
Per EMS-pt had unwitnessed seizure this am. Currently AAO x4. Hx of same, takes Keppra. Took last dose last night. Denies loss of bowel/bladder control.

## 2013-04-12 ENCOUNTER — Ambulatory Visit: Payer: Self-pay | Admitting: Diagnostic Neuroimaging

## 2013-05-09 ENCOUNTER — Emergency Department (HOSPITAL_COMMUNITY)
Admission: EM | Admit: 2013-05-09 | Discharge: 2013-05-09 | Disposition: A | Discharge status: Home / Self Care | Payer: Self-pay | Attending: Emergency Medicine | Admitting: Emergency Medicine

## 2013-05-09 ENCOUNTER — Encounter (HOSPITAL_COMMUNITY): Payer: Self-pay | Admitting: Emergency Medicine

## 2013-05-09 DIAGNOSIS — Z87891 Personal history of nicotine dependence: Secondary | ICD-10-CM | POA: Insufficient documentation

## 2013-05-09 DIAGNOSIS — Z3202 Encounter for pregnancy test, result negative: Secondary | ICD-10-CM | POA: Insufficient documentation

## 2013-05-09 DIAGNOSIS — R110 Nausea: Secondary | ICD-10-CM | POA: Insufficient documentation

## 2013-05-09 DIAGNOSIS — R569 Unspecified convulsions: Secondary | ICD-10-CM

## 2013-05-09 DIAGNOSIS — Z9119 Patient's noncompliance with other medical treatment and regimen: Secondary | ICD-10-CM | POA: Insufficient documentation

## 2013-05-09 DIAGNOSIS — Z8679 Personal history of other diseases of the circulatory system: Secondary | ICD-10-CM | POA: Insufficient documentation

## 2013-05-09 DIAGNOSIS — R51 Headache: Secondary | ICD-10-CM | POA: Insufficient documentation

## 2013-05-09 DIAGNOSIS — Z91199 Patient's noncompliance with other medical treatment and regimen due to unspecified reason: Secondary | ICD-10-CM | POA: Insufficient documentation

## 2013-05-09 DIAGNOSIS — G40909 Epilepsy, unspecified, not intractable, without status epilepticus: Secondary | ICD-10-CM | POA: Insufficient documentation

## 2013-05-09 LAB — URINALYSIS, ROUTINE W REFLEX MICROSCOPIC
Bilirubin Urine: NEGATIVE
Hgb urine dipstick: NEGATIVE
Protein, ur: 30 mg/dL — AB
Urobilinogen, UA: 1 mg/dL (ref 0.0–1.0)

## 2013-05-09 LAB — CBC WITH DIFFERENTIAL/PLATELET
Basophils Absolute: 0 10*3/uL (ref 0.0–0.1)
HCT: 39.6 % (ref 36.0–46.0)
Hemoglobin: 13.6 g/dL (ref 12.0–15.0)
Lymphocytes Relative: 25 % (ref 12–46)
Lymphs Abs: 1.7 10*3/uL (ref 0.7–4.0)
MCV: 95.7 fL (ref 78.0–100.0)
Monocytes Absolute: 0.5 10*3/uL (ref 0.1–1.0)
Neutro Abs: 4.6 10*3/uL (ref 1.7–7.7)
RBC: 4.14 MIL/uL (ref 3.87–5.11)
RDW: 11.9 % (ref 11.5–15.5)
WBC: 6.8 10*3/uL (ref 4.0–10.5)

## 2013-05-09 LAB — RAPID URINE DRUG SCREEN, HOSP PERFORMED
Amphetamines: NOT DETECTED
Barbiturates: NOT DETECTED
Cocaine: NOT DETECTED
Opiates: POSITIVE — AB
Tetrahydrocannabinol: POSITIVE — AB

## 2013-05-09 LAB — BASIC METABOLIC PANEL
CO2: 21 mEq/L (ref 19–32)
Chloride: 105 mEq/L (ref 96–112)
Creatinine, Ser: 0.64 mg/dL (ref 0.50–1.10)
Glucose, Bld: 86 mg/dL (ref 70–99)

## 2013-05-09 LAB — URINE MICROSCOPIC-ADD ON

## 2013-05-09 MED ORDER — KETOROLAC TROMETHAMINE 30 MG/ML IJ SOLN
30.0000 mg | Freq: Once | INTRAMUSCULAR | Status: AC
  Administered 2013-05-09: 30 mg via INTRAVENOUS
  Filled 2013-05-09: qty 1

## 2013-05-09 MED ORDER — MORPHINE SULFATE 4 MG/ML IJ SOLN
4.0000 mg | Freq: Once | INTRAMUSCULAR | Status: AC
  Administered 2013-05-09: 4 mg via INTRAVENOUS
  Filled 2013-05-09: qty 1

## 2013-05-09 MED ORDER — SODIUM CHLORIDE 0.9 % IV SOLN
1000.0000 mg | Freq: Once | INTRAVENOUS | Status: AC
  Administered 2013-05-09: 1000 mg via INTRAVENOUS
  Filled 2013-05-09: qty 10

## 2013-05-09 MED ORDER — METOCLOPRAMIDE HCL 5 MG/ML IJ SOLN
10.0000 mg | Freq: Once | INTRAMUSCULAR | Status: AC
  Administered 2013-05-09: 10 mg via INTRAVENOUS
  Filled 2013-05-09: qty 2

## 2013-05-09 MED ORDER — SODIUM CHLORIDE 0.9 % IV BOLUS (SEPSIS)
1000.0000 mL | Freq: Once | INTRAVENOUS | Status: AC
  Administered 2013-05-09: 1000 mL via INTRAVENOUS

## 2013-05-09 NOTE — ED Provider Notes (Signed)
CSN: [id number]     Arrival date & time 05/09/13  0554 History   First MD Initiated Contact with Patient 05/09/13 0630     Chief Complaint  Patient presents with  . Seizures   (Consider location/radiation/quality/duration/timing/severity/associated sxs/prior Treatment) HPI Comments: Patient is 22 year old female who was diagnosed with seizure disorder last December, she reports last week her Keppra was changed to 1000 mg BID but that yesterday she missed her morning and evening doses because she got to the pharmacy too late to get the medication.  Her family member states that this was like her typical seizure with the exception of short post-ictal period and no incontinence of bowel or bladder.  She reports feeling like normal with the exception of severe left sided headache and nausea.  She denies fever, chills, recent illnesses, alcohol or drug intake.  Patient is a 22 y.o. female presenting with seizures. The history is provided by the patient and a relative. No language interpreter was used.  Seizures Seizure activity on arrival: no   Seizure type:  Myoclonic and tonic Preceding symptoms: headache and nausea   Preceding symptoms: no sensation of an aura present, no dizziness, no panic and no vision change   Initial focality:  None Episode characteristics: abnormal movements, confusion, eye deviation and generalized shaking   Episode characteristics: no focal shaking, no incontinence, fully responsive, no tongue biting and responsive   Postictal symptoms: confusion   Postictal symptoms: no memory loss and no somnolence   Return to baseline: yes   Severity:  Moderate Duration:  3 minutes Timing:  Once Progression:  Resolved Context: change in medication and medical non-compliance   Context: not alcohol withdrawal, not drug use, not emotional upset, not flashing visual stimuli, not possible medication ingestion and not previous head injury   Recent head injury:  No recent head  injuries PTA treatment:  None History of seizures: yes     Past Medical History  Diagnosis Date  . Seizures   . Epilepsy   . Migraine    History reviewed. No pertinent past surgical history. Family History  Problem Relation Age of Onset  . Seizures Paternal Uncle     great grandmother   History  Substance Use Topics  . Smoking status: Former Smoker -- 0.30 packs/day    Types: Cigarettes  . Smokeless tobacco: Never Used  . Alcohol Use: Not on file   OB History   Grav Para Term Preterm Abortions TAB SAB Ect Mult Living                 Review of Systems  Neurological: Positive for seizures.  All other systems reviewed and are negative.    Allergies  Review of patient's allergies indicates no known allergies.  Home Medications   Current Outpatient Rx  Name  Route  Sig  Dispense  Refill  . levETIRAcetam (KEPPRA) 1000 MG tablet   Oral   Take 1 tablet (1,000 mg total) by mouth every 12 (twelve) hours.   60 tablet   1    BP 92/55  Pulse 102  Temp(Src) 97.4 F (36.3 C)  Resp 20  Ht 5\' 5"  (1.651 m)  Wt 170 lb (77.111 kg)  BMI 28.29 kg/m2  SpO2 100%  LMP 04/02/2013 Physical Exam  Nursing note and vitals reviewed. Constitutional: She is oriented to person, place, and time. She appears well-developed and well-nourished. No distress.  HENT:  Head: Normocephalic and atraumatic.  Right Ear: External ear normal.  Left  Ear: External ear normal.  Nose: Nose normal.  Mouth/Throat: Oropharynx is clear and moist. No oropharyngeal exudate.  Eyes: Conjunctivae are normal. Pupils are equal, round, and reactive to light. No scleral icterus.  Neck: Normal range of motion. Neck supple. No spinous process tenderness and no muscular tenderness present.  Cardiovascular: Normal rate, regular rhythm and normal heart sounds.  Exam reveals no gallop and no friction rub.   No murmur heard. Pulmonary/Chest: Effort normal and breath sounds normal. No respiratory distress. She has no  wheezes. She has no rales. She exhibits no tenderness.  Abdominal: Soft. Bowel sounds are normal. She exhibits no distension. There is no tenderness. There is no rebound and no guarding.  Musculoskeletal: Normal range of motion. She exhibits no edema and no tenderness.  Neurological: She is alert and oriented to person, place, and time. No cranial nerve deficit. She exhibits normal muscle tone. Coordination normal.  Skin: Skin is warm and dry. No rash noted. No erythema. No pallor.  Psychiatric: She has a normal mood and affect. Her behavior is normal. Judgment and thought content normal.    ED Course  Procedures (including critical care time) Labs Review Labs Reviewed  CBC WITH DIFFERENTIAL  BASIC METABOLIC PANEL  URINE RAPID DRUG SCREEN (HOSP PERFORMED)  URINALYSIS, ROUTINE W REFLEX MICROSCOPIC   Imaging Review No results found.  EKG Interpretation   None      Results for orders placed during the hospital encounter of 05/09/13  CBC WITH DIFFERENTIAL      Result Value Range   WBC 6.8  4.0 - 10.5 K/uL   RBC 4.14  3.87 - 5.11 MIL/uL   Hemoglobin 13.6  12.0 - 15.0 g/dL   HCT 40.9  81.1 - 91.4 %   MCV 95.7  78.0 - 100.0 fL   MCH 32.9  26.0 - 34.0 pg   MCHC 34.3  30.0 - 36.0 g/dL   RDW 78.2  95.6 - 21.3 %   Platelets 214  150 - 400 K/uL   Neutrophils Relative % 68  43 - 77 %   Neutro Abs 4.6  1.7 - 7.7 K/uL   Lymphocytes Relative 25  12 - 46 %   Lymphs Abs 1.7  0.7 - 4.0 K/uL   Monocytes Relative 7  3 - 12 %   Monocytes Absolute 0.5  0.1 - 1.0 K/uL   Eosinophils Relative 0  0 - 5 %   Eosinophils Absolute 0.0  0.0 - 0.7 K/uL   Basophils Relative 0  0 - 1 %   Basophils Absolute 0.0  0.0 - 0.1 K/uL  BASIC METABOLIC PANEL      Result Value Range   Sodium 137  135 - 145 mEq/L   Potassium 3.9  3.5 - 5.1 mEq/L   Chloride 105  96 - 112 mEq/L   CO2 21  19 - 32 mEq/L   Glucose, Bld 86  70 - 99 mg/dL   BUN 7  6 - 23 mg/dL   Creatinine, Ser 0.86  0.50 - 1.10 mg/dL   Calcium  8.9  8.4 - 57.8 mg/dL   GFR calc non Af Amer >90  >90 mL/min   GFR calc Af Amer >90  >90 mL/min  URINE RAPID DRUG SCREEN (HOSP PERFORMED)      Result Value Range   Opiates POSITIVE (*) NONE DETECTED   Cocaine NONE DETECTED  NONE DETECTED   Benzodiazepines POSITIVE (*) NONE DETECTED   Amphetamines NONE DETECTED  NONE DETECTED  Tetrahydrocannabinol POSITIVE (*) NONE DETECTED   Barbiturates NONE DETECTED  NONE DETECTED  URINALYSIS, ROUTINE W REFLEX MICROSCOPIC      Result Value Range   Color, Urine YELLOW  YELLOW   APPearance CLOUDY (*) CLEAR   Specific Gravity, Urine 1.023  1.005 - 1.030   pH 6.0  5.0 - 8.0   Glucose, UA NEGATIVE  NEGATIVE mg/dL   Hgb urine dipstick NEGATIVE  NEGATIVE   Bilirubin Urine NEGATIVE  NEGATIVE   Ketones, ur NEGATIVE  NEGATIVE mg/dL   Protein, ur 30 (*) NEGATIVE mg/dL   Urobilinogen, UA 1.0  0.0 - 1.0 mg/dL   Nitrite NEGATIVE  NEGATIVE   Leukocytes, UA NEGATIVE  NEGATIVE  URINE MICROSCOPIC-ADD ON      Result Value Range   Squamous Epithelial / LPF FEW (*) RARE   WBC, UA 0-2  <3 WBC/hpf   RBC / HPF 0-2  <3 RBC/hpf   Bacteria, UA FEW (*) RARE  POCT PREGNANCY, URINE      Result Value Range   Preg Test, Ur NEGATIVE  NEGATIVE   No results found.  9:39 AM No further seizure activity, stable in ED, loaded with 1000mg  Keppra here. MDM  Seizure  Patient with history of seizure disorder presents after having not taken her Keppra at all yesterday with her typical tonic-clonic seizure, no focal neurological findings.  Loaded with a gram of Keppra here, no further seizure activity, patient requesting discharge, will get medication refilled today.   Izola Price Marisue Humble, [place] 05/09/13 [phone]

## 2013-05-09 NOTE — ED Notes (Signed)
Pt reports she had a seizure tonight witnessed by her boyfriend.  He reports it did not last 5 minutes.  Pt was without keppra for sat eve dose.

## 2013-05-18 NOTE — ED Provider Notes (Signed)
Medical screening examination/treatment/procedure(s) were performed by non-physician practitioner and as supervising physician I was immediately available for consultation/collaboration.  EKG Interpretation   None        Derwood Kaplan, MD 05/18/13 [phone]

## 2013-05-24 ENCOUNTER — Encounter: Payer: Self-pay | Admitting: Neurology

## 2013-05-24 ENCOUNTER — Ambulatory Visit: Payer: Self-pay | Admitting: Neurology

## 2013-06-01 ENCOUNTER — Encounter (HOSPITAL_COMMUNITY): Payer: Self-pay | Admitting: Emergency Medicine

## 2013-06-01 ENCOUNTER — Emergency Department (HOSPITAL_COMMUNITY)
Admission: EM | Admit: 2013-06-01 | Discharge: 2013-06-01 | Disposition: A | Discharge status: Home / Self Care | Payer: Self-pay | Attending: Emergency Medicine | Admitting: Emergency Medicine

## 2013-06-01 DIAGNOSIS — Z87891 Personal history of nicotine dependence: Secondary | ICD-10-CM | POA: Insufficient documentation

## 2013-06-01 DIAGNOSIS — G43909 Migraine, unspecified, not intractable, without status migrainosus: Secondary | ICD-10-CM | POA: Insufficient documentation

## 2013-06-01 DIAGNOSIS — Z79899 Other long term (current) drug therapy: Secondary | ICD-10-CM | POA: Insufficient documentation

## 2013-06-01 DIAGNOSIS — G40909 Epilepsy, unspecified, not intractable, without status epilepticus: Secondary | ICD-10-CM | POA: Insufficient documentation

## 2013-06-01 MED ORDER — LEVETIRACETAM 1000 MG PO TABS: 1000.0000 mg | ORAL_TABLET | Freq: Two times a day (BID) | ORAL | Status: DC

## 2013-06-01 NOTE — ED Notes (Signed)
Pt reports migraine since yesterday. No nausea/vomiting. No photosensitivity. Also states out of seizure medication.

## 2013-06-01 NOTE — ED Provider Notes (Signed)
CSN: [id number]     Arrival date & time 06/01/13  1010 History   First MD Initiated Contact with Patient 06/01/13 1113     Chief Complaint  Patient presents with  . Migraine  . Medication Refill   (Consider location/radiation/quality/duration/timing/severity/associated sxs/prior Treatment) HPI Comments: Patient presents today requesting a refill of her Keppra.  She is currently on Keppra 1000 mg BID for Seizure Disorder.  She states that her last dose was two days ago.  She denies recent seizure activity.  She reports that she has an appointment scheduled with [facility] on December 11th.  She also reports that last evening she had a left sided headache.  She reports that the headache came on gradually and was similar to headaches that she has had in the past.  She denies any head injury.  Denies nausea, vomiting, or changes in vision.  Denies fever, chills, or neck stiffness.  She states that she took a BC powder this morning, which gave her relief.  She reports that she does not need any further medication for her headache at this time.  She reports that she really just came to the ED to get a refill of her Keppra.    Patient is a 22 y.o. female presenting with migraines. The history is provided by the patient.  Migraine    Past Medical History  Diagnosis Date  . Seizures   . Epilepsy   . Migraine    History reviewed. No pertinent past surgical history. Family History  Problem Relation Age of Onset  . Seizures Paternal Uncle     great grandmother   History  Substance Use Topics  . Smoking status: Former Smoker -- 0.30 packs/day    Types: Cigarettes  . Smokeless tobacco: Never Used  . Alcohol Use: Not on file   OB History   Grav Para Term Preterm Abortions TAB SAB Ect Mult Living                 Review of Systems  All other systems reviewed and are negative.    Allergies  Review of patient's allergies indicates no known allergies.  Home Medications    Current Outpatient Rx  Name  Route  Sig  Dispense  Refill  . levETIRAcetam (KEPPRA) 1000 MG tablet   Oral   Take 1 tablet (1,000 mg total) by mouth every 12 (twelve) hours.   60 tablet   1    BP 151/83  Pulse 73  Temp(Src) 98 F (36.7 C) (Oral)  Resp 16  SpO2 98%  LMP 04/02/2013 Physical Exam  Nursing note and vitals reviewed. Constitutional: She appears well-developed and well-nourished. No distress.  HENT:  Head: Normocephalic and atraumatic.  Eyes: EOM are normal. Pupils are equal, round, and reactive to light.  Neck: Normal range of motion. Neck supple.  Cardiovascular: Normal rate, regular rhythm and normal heart sounds.   Pulmonary/Chest: Effort normal and breath sounds normal.  Musculoskeletal: Normal range of motion.  Neurological: She is alert. She has normal strength. No cranial nerve deficit or sensory deficit. Gait normal.  Normal finger to nose testing bilaterally Normal rapid alternating movements  Skin: Skin is warm and dry. No rash noted. She is not diaphoretic.  Psychiatric: She has a normal mood and affect.    ED Course  Procedures (including critical care time) Labs Review Labs Reviewed - No data to display Imaging Review No results found.  EKG Interpretation   None       MDM  No diagnosis found. Patient presents today requesting refill of her Keppra.  Patient has an appointment scheduled with [facility] on 06-24-13.  Patient also complaining of a headache that was relieved by [facility] Powder prior to arrival.  Headache similar to headaches she has had in the past.  Normal neurological exam.  Patient declined further treatment of her headache at this time.  Patient stable for discharge.  Patient given Rx for Keppra.    Santiago Glad, PA-C 06/01/13 [phone]

## 2013-06-01 NOTE — ED Provider Notes (Signed)
Medical screening examination/treatment/procedure(s) were performed by non-physician practitioner and as supervising physician I was immediately available for consultation/collaboration.  EKG Interpretation   None         Benny Lennert, MD 06/01/13 [phone]

## 2013-06-24 ENCOUNTER — Ambulatory Visit: Payer: Self-pay | Admitting: Diagnostic Neuroimaging

## 2013-07-30 ENCOUNTER — Emergency Department (HOSPITAL_COMMUNITY)
Admission: EM | Admit: 2013-07-30 | Discharge: 2013-07-30 | Disposition: A | Discharge status: Home / Self Care | Payer: Self-pay | Attending: Emergency Medicine | Admitting: Emergency Medicine

## 2013-07-30 ENCOUNTER — Encounter (HOSPITAL_COMMUNITY): Payer: Self-pay | Admitting: Emergency Medicine

## 2013-07-30 DIAGNOSIS — R202 Paresthesia of skin: Secondary | ICD-10-CM

## 2013-07-30 DIAGNOSIS — G43109 Migraine with aura, not intractable, without status migrainosus: Secondary | ICD-10-CM | POA: Insufficient documentation

## 2013-07-30 DIAGNOSIS — G40909 Epilepsy, unspecified, not intractable, without status epilepticus: Secondary | ICD-10-CM | POA: Insufficient documentation

## 2013-07-30 DIAGNOSIS — Z87891 Personal history of nicotine dependence: Secondary | ICD-10-CM | POA: Insufficient documentation

## 2013-07-30 DIAGNOSIS — R209 Unspecified disturbances of skin sensation: Secondary | ICD-10-CM | POA: Insufficient documentation

## 2013-07-30 LAB — POCT I-STAT, CHEM 8
BUN: 3 mg/dL — ABNORMAL LOW (ref 6–23)
CALCIUM ION: 1.28 mmol/L — AB (ref 1.12–1.23)
CREATININE: 0.8 mg/dL (ref 0.50–1.10)
Chloride: 110 mEq/L (ref 96–112)
GLUCOSE: 88 mg/dL (ref 70–99)
HEMATOCRIT: 43 % (ref 36.0–46.0)
HEMOGLOBIN: 14.6 g/dL (ref 12.0–15.0)
POTASSIUM: 4 meq/L (ref 3.7–5.3)
Sodium: 142 mEq/L (ref 137–147)
TCO2: 19 mmol/L (ref 0–100)

## 2013-07-30 MED ORDER — PROMETHAZINE HCL 25 MG/ML IJ SOLN
12.5000 mg | Freq: Once | INTRAMUSCULAR | Status: AC
  Administered 2013-07-30: 12.5 mg via INTRAMUSCULAR
  Filled 2013-07-30: qty 1

## 2013-07-30 MED ORDER — KETOROLAC TROMETHAMINE 30 MG/ML IJ SOLN
30.0000 mg | Freq: Once | INTRAMUSCULAR | Status: AC
  Administered 2013-07-30: 30 mg via INTRAVENOUS
  Filled 2013-07-30: qty 1

## 2013-07-30 NOTE — Progress Notes (Signed)
[facility] did not get to see patient but will be sending information about the [facility] Card, using the address provided.  °

## 2013-07-30 NOTE — ED Provider Notes (Signed)
CSN: [id number]     Arrival date & time 07/30/13  1118 History   First MD Initiated Contact with Patient 07/30/13 1205     Chief Complaint  Patient presents with  . Numbness   (Consider location/radiation/quality/duration/timing/severity/associated sxs/prior Treatment) The history is provided by the patient.   patient presents with tingling in the tips of her fingers on both hands in the right side of her face. She states it feels a little no more like tingling. No weakness no difficulty using it. She had a headache yesterday. She's a previous history of seizures and has had a history of migraines. She's been switched recently from Keppra to Topamax. She is tapering down on the Keppra and is down from 2 g a day to 1 g a day. She's going up on the Topamax. No confusion. No difficulty with vision. She has not had episodes of this before.  Past Medical History  Diagnosis Date  . Seizures   . Epilepsy   . Migraine    History reviewed. No pertinent past surgical history. Family History  Problem Relation Age of Onset  . Seizures Paternal Uncle     great grandmother   History  Substance Use Topics  . Smoking status: Former Smoker -- 0.30 packs/day    Types: Cigarettes  . Smokeless tobacco: Never Used  . Alcohol Use: Not on file   OB History   Grav Para Term Preterm Abortions TAB SAB Ect Mult Living                 Review of Systems  Constitutional: Negative for activity change and appetite change.  Eyes: Negative for pain.  Respiratory: Negative for chest tightness and shortness of breath.   Cardiovascular: Negative for chest pain and leg swelling.  Gastrointestinal: Negative for nausea, vomiting, abdominal pain and diarrhea.  Genitourinary: Negative for flank pain.  Musculoskeletal: Negative for back pain and neck stiffness.  Skin: Negative for rash.  Neurological: Positive for numbness and headaches. Negative for weakness.  Psychiatric/Behavioral: Negative for behavioral  problems.    Allergies  Review of patient's allergies indicates no known allergies.  Home Medications   Current Outpatient Rx  Name  Route  Sig  Dispense  Refill  . levETIRAcetam (KEPPRA) 1000 MG tablet   Oral   Take 1 tablet (1,000 mg total) by mouth every 12 (twelve) hours.   60 tablet   1   . topiramate (TOPAMAX) 25 MG tablet   Oral   Take 25 mg by mouth. 12 tablets at bedtime          BP 116/75  Pulse 81  Temp(Src) 98.3 F (36.8 C) (Oral)  Resp 14  SpO2 100% Physical Exam  Nursing note and vitals reviewed. Constitutional: She is oriented to person, place, and time. She appears well-developed and well-nourished.  HENT:  Head: Normocephalic and atraumatic.  Eyes: EOM are normal. Pupils are equal, round, and reactive to light.  Neck: Normal range of motion. Neck supple.  Cardiovascular: Normal rate, regular rhythm and normal heart sounds.   No murmur heard. Pulmonary/Chest: Effort normal and breath sounds normal. No respiratory distress. She has no wheezes. She has no rales.  Abdominal: Soft. Bowel sounds are normal. She exhibits no distension. There is no tenderness. There is no rebound and no guarding.  Musculoskeletal: Normal range of motion.  Neurological: She is alert and oriented to person, place, and time. No cranial nerve deficit.  Patient with paresthesias to right side of face and upper  and lower face. No weakness. Extraocular movements intact. Pupils responsive. Good strength in bilateral upper extremities. Paresthesias to tips of all fingers, but primarily index and middle finger on both hands.  Skin: Skin is warm and dry.  Psychiatric: She has a normal mood and affect. Her speech is normal.    ED Course  Procedures (including critical care time) Labs Review Labs Reviewed  POCT I-STAT, CHEM 8 - Abnormal; Notable for the following:    BUN 3 (*)    Calcium, Ion 1.28 (*)    All other components within normal limits   Imaging Review No results  found.  EKG Interpretation   None       MDM   1. Paresthesias   2. Complicated migraine    Patient with paresthesias in her fingertips. Also in her right face. Has history of migraines and seizures. Patient did have a headache yesterday it is resolved. Patient's had previous workup with MRI and CT. His recent change from Keppra to Topamax. Patient was treated with Phenergan and Toradol and has had resolution of his symptoms. Maybe a combination of migraine. Will discharge home.    Juliet RudeNathan R. Rubin PayorPickering, MD 07/30/13 1436

## 2013-07-30 NOTE — ED Notes (Signed)
Pt c/o of numbness and tingling down both arms to fingertips also c/o of nausea. Started last night. Denies chest pain.

## 2013-07-30 NOTE — Discharge Instructions (Signed)
Paresthesia °Paresthesia is an abnormal burning or prickling sensation. This sensation is generally felt in the hands, arms, legs, or feet. However, it may occur in any part of the body. It is usually not painful. The feeling may be described as: °· Tingling or numbness. °· "Pins and needles." °· Skin crawling. °· Buzzing. °· Limbs "falling asleep." °· Itching. °Most people experience temporary (transient) paresthesia at some time in their lives. °CAUSES  °Paresthesia may occur when you breathe too quickly (hyperventilation). It can also occur without any apparent cause. Commonly, paresthesia occurs when pressure is placed on a nerve. The feeling quickly goes away once the pressure is removed. For some people, however, paresthesia is a long-lasting (chronic) condition caused by an underlying disorder. The underlying disorder may be: °· A traumatic, direct injury to nerves. Examples include a: °· Broken (fractured) neck. °· Fractured skull. °· A disorder affecting the brain and spinal cord (central nervous system). Examples include: °· Transverse myelitis. °· Encephalitis. °· Transient ischemic attack. °· Multiple sclerosis. °· Stroke. °· Tumor or blood vessel problems, such as an arteriovenous malformation pressing against the brain or spinal cord. °· A condition that damages the peripheral nerves (peripheral neuropathy). Peripheral nerves are not part of the brain and spinal cord. These conditions include: °· Diabetes. °· Peripheral vascular disease. °· Nerve entrapment syndromes, such as carpal tunnel syndrome. °· Shingles. °· Hypothyroidism. °· Vitamin B12 deficiencies. °· Alcoholism. °· Heavy metal poisoning (lead, arsenic). °· Rheumatoid arthritis. °· Systemic lupus erythematosus. °DIAGNOSIS  °Your caregiver will attempt to find the underlying cause of your paresthesia. Your caregiver may: °· Take your medical history. °· Perform a physical exam. °· Order various lab tests. °· Order imaging tests. °TREATMENT    °Treatment for paresthesia depends on the underlying cause. °HOME CARE INSTRUCTIONS °· Avoid drinking alcohol. °· You may consider massage or acupuncture to help relieve your symptoms. °· Keep all follow-up appointments as directed by your caregiver. °SEEK IMMEDIATE MEDICAL CARE IF:  °· You feel weak. °· You have trouble walking or moving. °· You have problems with speech or vision. °· You feel confused. °· You cannot control your bladder or bowel movements. °· You feel numbness after an injury. °· You faint. °· Your burning or prickling feeling gets worse when walking. °· You have pain, cramps, or dizziness. °· You develop a rash. °MAKE SURE YOU: °· Understand these instructions. °· Will watch your condition. °· Will get help right away if you are not doing well or get worse. °Document Released: 06/21/2002 Document Revised: 09/23/2011 Document Reviewed: 03/22/2011 °[employer]® Patient Information ©2014 [employer]. ° °

## 2013-08-23 ENCOUNTER — Encounter (HOSPITAL_COMMUNITY): Payer: Self-pay | Admitting: Emergency Medicine

## 2013-08-23 ENCOUNTER — Emergency Department (HOSPITAL_COMMUNITY): Payer: Self-pay

## 2013-08-23 ENCOUNTER — Emergency Department (HOSPITAL_COMMUNITY)
Admission: EM | Admit: 2013-08-23 | Discharge: 2013-08-23 | Disposition: A | Discharge status: Home / Self Care | Payer: Self-pay | Attending: Emergency Medicine | Admitting: Emergency Medicine

## 2013-08-23 DIAGNOSIS — Z79899 Other long term (current) drug therapy: Secondary | ICD-10-CM | POA: Insufficient documentation

## 2013-08-23 DIAGNOSIS — R69 Illness, unspecified: Secondary | ICD-10-CM

## 2013-08-23 DIAGNOSIS — G40909 Epilepsy, unspecified, not intractable, without status epilepticus: Secondary | ICD-10-CM | POA: Insufficient documentation

## 2013-08-23 DIAGNOSIS — Z87891 Personal history of nicotine dependence: Secondary | ICD-10-CM | POA: Insufficient documentation

## 2013-08-23 DIAGNOSIS — J111 Influenza due to unidentified influenza virus with other respiratory manifestations: Secondary | ICD-10-CM

## 2013-08-23 DIAGNOSIS — G43909 Migraine, unspecified, not intractable, without status migrainosus: Secondary | ICD-10-CM | POA: Insufficient documentation

## 2013-08-23 MED ORDER — OSELTAMIVIR PHOSPHATE 75 MG PO CAPS
75.0000 mg | ORAL_CAPSULE | Freq: Once | ORAL | Status: AC
  Administered 2013-08-23: 75 mg via ORAL
  Filled 2013-08-23: qty 1

## 2013-08-23 MED ORDER — OSELTAMIVIR PHOSPHATE 75 MG PO CAPS: 75.0000 mg | ORAL_CAPSULE | Freq: Every day | ORAL | Status: DC

## 2013-08-23 NOTE — ED Notes (Signed)
Pt states she has had cough, body pain, chills, emesis and diarrhea for the past few days. Pt states niece was diagnosed with pneumonia yesterday.

## 2013-08-23 NOTE — Discharge Instructions (Signed)
As discussed, it is important that you follow up as soon as possible with your physician for continued management of your condition. ° °If you develop any new, or concerning changes in your condition, please return to the emergency department immediately. ° °

## 2013-08-23 NOTE — ED Provider Notes (Signed)
CSN: [id number]     Arrival date & time 08/23/13  1052 History   First MD Initiated Contact with Patient 08/23/13 1200     Chief Complaint  Patient presents with  . Cough  . Emesis     (Consider location/radiation/quality/duration/timing/severity/associated sxs/prior Treatment) HPI Patient presents with concerns of cough, nausea, vomiting, diarrhea. Symptoms began yesterday.  Since onset symptoms have not been improved with OTC medication. She denies confusion, disorientation, syncope, urinary complaints. Patient notes that she has multiple family members with similar illness,  Past Medical History  Diagnosis Date  . Seizures   . Epilepsy   . Migraine    History reviewed. No pertinent past surgical history. Family History  Problem Relation Age of Onset  . Seizures Paternal Uncle     great grandmother   History  Substance Use Topics  . Smoking status: Former Smoker -- 0.30 packs/day    Types: Cigarettes  . Smokeless tobacco: Never Used  . Alcohol Use: Not on file   OB History   Grav Para Term Preterm Abortions TAB SAB Ect Mult Living                 Review of Systems  Constitutional:       Per HPI, otherwise negative  HENT:       Per HPI, otherwise negative  Respiratory:       Per HPI, otherwise negative  Cardiovascular:       Per HPI, otherwise negative  Gastrointestinal: Positive for nausea, vomiting and diarrhea.  Endocrine:       Negative aside from HPI  Genitourinary:       Neg aside from HPI   Musculoskeletal:       Per HPI, otherwise negative  Skin: Negative.   Neurological: Negative for syncope.      Allergies  Review of patient's allergies indicates no known allergies.  Home Medications   Current Outpatient Rx  Name  Route  Sig  Dispense  Refill  . dextromethorphan (DELSYM) 30 MG/5ML liquid   Oral   Take 30 mg by mouth every 12 (twelve) hours as needed for cough.         . DM-Doxylamine-Acetaminophen 15-6.25-325 MG/15ML LIQD   Oral  Take 30 mLs by mouth every 12 (twelve) hours as needed (for cold).         Marland Kitchen ibuprofen (ADVIL,MOTRIN) 200 MG tablet   Oral   Take 400 mg by mouth every 6 (six) hours as needed for fever or moderate pain.         Marland Kitchen topiramate (TOPAMAX) 25 MG tablet   Oral   Take 200 mg by mouth at bedtime. Week 8-take 8 tablets qpm Week 9-take 10 tablets qpm Week 10-take 12 tablets qpm Then take fewer tablets         . oseltamivir (TAMIFLU) 75 MG capsule   Oral   Take 1 capsule (75 mg total) by mouth daily.   9 capsule   0    BP 126/76  Pulse 99  Temp(Src) 98.2 F (36.8 C) (Oral)  Resp 16  SpO2 100%  LMP 08/15/2013 Physical Exam  Nursing note and vitals reviewed. Constitutional: She is oriented to person, place, and time. She appears well-developed and well-nourished. No distress.  HENT:  Head: Normocephalic and atraumatic.  Eyes: Conjunctivae and EOM are normal.  Cardiovascular: Normal rate and regular rhythm.   Pulmonary/Chest: Effort normal and breath sounds normal. No stridor. No respiratory distress.  Abdominal: She exhibits no distension.  There is no tenderness.  Musculoskeletal: She exhibits no edema.  Neurological: She is alert and oriented to person, place, and time. No cranial nerve deficit.  Skin: Skin is warm and dry.  Psychiatric: She has a normal mood and affect.    ED Course  Procedures (including critical care time) Labs Review Labs Reviewed - No data to display Imaging Review Dg Chest 2 View  08/23/2013   CLINICAL DATA:  Chest discomfort and cough  EXAM: CHEST  2 VIEW  COMPARISON:  None.  FINDINGS: Lungs are clear. Heart size and pulmonary vascularity are normal. No adenopathy. No pneumothorax. No bone lesions.  IMPRESSION: No abnormality noted.   Electronically Signed   By: Bretta BangWilliam  Woodruff M.D.   On: 08/23/2013 12:56    EKG Interpretation   None       MDM   Final diagnoses:  Influenza-like illness     patient presents with multiple complaints  concerning for viral infection.  Given the patient's multiple family members with similar illness, her lack of immunization, the short duration of symptoms thus far, she was started on Tamiflu, discharged to follow up with primary care.      Gerhard Munchobert Roma Bierlein, MD 08/23/13 1314

## 2013-08-23 NOTE — Progress Notes (Signed)
[facility] provided pt with a list of primary care resources, ACA information, and [facility] to help her establish primary care. [facility] on 3/18.

## 2014-01-25 ENCOUNTER — Emergency Department (HOSPITAL_COMMUNITY)
Admission: EM | Admit: 2014-01-25 | Discharge: 2014-01-25 | Disposition: A | Discharge status: Home / Self Care | Payer: Self-pay | Attending: Emergency Medicine | Admitting: Emergency Medicine

## 2014-01-25 ENCOUNTER — Encounter (HOSPITAL_COMMUNITY): Payer: Self-pay | Admitting: Emergency Medicine

## 2014-01-25 DIAGNOSIS — N3000 Acute cystitis without hematuria: Secondary | ICD-10-CM | POA: Insufficient documentation

## 2014-01-25 DIAGNOSIS — Z87891 Personal history of nicotine dependence: Secondary | ICD-10-CM | POA: Insufficient documentation

## 2014-01-25 DIAGNOSIS — Z79899 Other long term (current) drug therapy: Secondary | ICD-10-CM | POA: Insufficient documentation

## 2014-01-25 DIAGNOSIS — G40909 Epilepsy, unspecified, not intractable, without status epilepticus: Secondary | ICD-10-CM | POA: Insufficient documentation

## 2014-01-25 DIAGNOSIS — Z3202 Encounter for pregnancy test, result negative: Secondary | ICD-10-CM | POA: Insufficient documentation

## 2014-01-25 DIAGNOSIS — G43909 Migraine, unspecified, not intractable, without status migrainosus: Secondary | ICD-10-CM | POA: Insufficient documentation

## 2014-01-25 DIAGNOSIS — R112 Nausea with vomiting, unspecified: Secondary | ICD-10-CM | POA: Insufficient documentation

## 2014-01-25 DIAGNOSIS — Z792 Long term (current) use of antibiotics: Secondary | ICD-10-CM | POA: Insufficient documentation

## 2014-01-25 LAB — URINALYSIS, ROUTINE W REFLEX MICROSCOPIC
Bilirubin Urine: NEGATIVE
GLUCOSE, UA: NEGATIVE mg/dL
Hgb urine dipstick: NEGATIVE
Ketones, ur: NEGATIVE mg/dL
Nitrite: POSITIVE — AB
PROTEIN: NEGATIVE mg/dL
Specific Gravity, Urine: 1.013 (ref 1.005–1.030)
UROBILINOGEN UA: 0.2 mg/dL (ref 0.0–1.0)
pH: 6 (ref 5.0–8.0)

## 2014-01-25 LAB — COMPREHENSIVE METABOLIC PANEL
ALT: 19 U/L (ref 0–35)
ANION GAP: 18 — AB (ref 5–15)
AST: 24 U/L (ref 0–37)
Albumin: 4.6 g/dL (ref 3.5–5.2)
Alkaline Phosphatase: 94 U/L (ref 39–117)
BUN: 11 mg/dL (ref 6–23)
CALCIUM: 10.3 mg/dL (ref 8.4–10.5)
CO2: 18 meq/L — AB (ref 19–32)
Chloride: 107 mEq/L (ref 96–112)
Creatinine, Ser: 1.38 mg/dL — ABNORMAL HIGH (ref 0.50–1.10)
GFR, EST AFRICAN AMERICAN: 62 mL/min — AB (ref >90)
GFR, EST NON AFRICAN AMERICAN: 54 mL/min — AB (ref >90)
GLUCOSE: 105 mg/dL — AB (ref 70–99)
Potassium: 3.4 mEq/L — ABNORMAL LOW (ref 3.7–5.3)
Sodium: 143 mEq/L (ref 137–147)
Total Bilirubin: 0.4 mg/dL (ref 0.3–1.2)
Total Protein: 8 g/dL (ref 6.0–8.3)

## 2014-01-25 LAB — URINE MICROSCOPIC-ADD ON

## 2014-01-25 LAB — CBC WITH DIFFERENTIAL/PLATELET
Basophils Absolute: 0 10*3/uL (ref 0.0–0.1)
Basophils Relative: 0 % (ref 0–1)
Eosinophils Absolute: 0 10*3/uL (ref 0.0–0.7)
Eosinophils Relative: 0 % (ref 0–5)
HCT: 40.5 % (ref 36.0–46.0)
Hemoglobin: 13.6 g/dL (ref 12.0–15.0)
LYMPHS ABS: 1.2 10*3/uL (ref 0.7–4.0)
LYMPHS PCT: 14 % (ref 12–46)
MCH: 32.5 pg (ref 26.0–34.0)
MCHC: 33.6 g/dL (ref 30.0–36.0)
MCV: 96.7 fL (ref 78.0–100.0)
Monocytes Absolute: 0.5 10*3/uL (ref 0.1–1.0)
Monocytes Relative: 6 % (ref 3–12)
Neutro Abs: 6.9 10*3/uL (ref 1.7–7.7)
Neutrophils Relative %: 80 % — ABNORMAL HIGH (ref 43–77)
Platelets: 315 10*3/uL (ref 150–400)
RBC: 4.19 MIL/uL (ref 3.87–5.11)
RDW: 12.8 % (ref 11.5–15.5)
WBC: 8.7 10*3/uL (ref 4.0–10.5)

## 2014-01-25 LAB — LIPASE, BLOOD: LIPASE: 16 U/L (ref 11–59)

## 2014-01-25 LAB — PREGNANCY, URINE: Preg Test, Ur: NEGATIVE

## 2014-01-25 MED ORDER — SODIUM CHLORIDE 0.9 % IV SOLN
INTRAVENOUS | Status: DC
  Administered 2014-01-25: 18:00:00 via INTRAVENOUS

## 2014-01-25 MED ORDER — SODIUM CHLORIDE 0.9 % IV BOLUS (SEPSIS)
1000.0000 mL | Freq: Once | INTRAVENOUS | Status: AC
  Administered 2014-01-25: 1000 mL via INTRAVENOUS

## 2014-01-25 MED ORDER — ONDANSETRON HCL 4 MG/2ML IJ SOLN
4.0000 mg | Freq: Once | INTRAMUSCULAR | Status: AC
  Administered 2014-01-25: 4 mg via INTRAVENOUS
  Filled 2014-01-25: qty 2

## 2014-01-25 MED ORDER — CEPHALEXIN 500 MG PO CAPS: 500.0000 mg | ORAL_CAPSULE | Freq: Four times a day (QID) | ORAL | Status: DC

## 2014-01-25 MED ORDER — MORPHINE SULFATE 4 MG/ML IJ SOLN
4.0000 mg | Freq: Once | INTRAMUSCULAR | Status: AC
  Administered 2014-01-25: 4 mg via INTRAVENOUS
  Filled 2014-01-25: qty 1

## 2014-01-25 MED ORDER — ONDANSETRON HCL 8 MG PO TABS: 8.0000 mg | ORAL_TABLET | Freq: Three times a day (TID) | ORAL | Status: DC | PRN

## 2014-01-25 MED ORDER — CEPHALEXIN 500 MG PO CAPS
500.0000 mg | ORAL_CAPSULE | Freq: Once | ORAL | Status: AC
  Administered 2014-01-25: 500 mg via ORAL
  Filled 2014-01-25: qty 1

## 2014-01-25 MED ORDER — LORAZEPAM 2 MG/ML IJ SOLN
1.0000 mg | Freq: Once | INTRAMUSCULAR | Status: AC
  Administered 2014-01-25: 1 mg via INTRAVENOUS
  Filled 2014-01-25: qty 1

## 2014-01-25 NOTE — ED Notes (Signed)
Patient is aware we need a urine specimen. 

## 2014-01-25 NOTE — ED Notes (Signed)
Pt c/o generalized abdominal pain, lower back pain, and emesis x 1 day.  Pain score 10/10.  Pt reports taking OTC medication w/o relief.

## 2014-01-25 NOTE — ED Provider Notes (Signed)
CSN: [id number]     Arrival date & time 01/25/14  1504 History   First MD Initiated Contact with Patient 01/25/14 1617     Chief Complaint  Patient presents with  . Abdominal Pain  . Emesis  . Back Pain     (Consider location/radiation/quality/duration/timing/severity/associated sxs/prior Treatment) Patient is a 23 y.o. female presenting with abdominal pain, vomiting, and back pain. The history is provided by the patient.  Abdominal Pain Associated symptoms: vomiting   Emesis Associated symptoms: abdominal pain   Back Pain Associated symptoms: abdominal pain    She began to have persistent, vomiting, this morning, "every hour." Emesis is yellow. She also has crampy abdominal pain. She denies diarrhea. No known sick contacts or known abnormal food ingestion. She denies fever, cough, chest pain, or dizziness. No medications tried. There are no other known modifying factors.  Past Medical History  Diagnosis Date  . Seizures   . Epilepsy   . Migraine    History reviewed. No pertinent past surgical history. Family History  Problem Relation Age of Onset  . Seizures Paternal Uncle     great grandmother   History  Substance Use Topics  . Smoking status: Former Smoker -- 0.30 packs/day    Types: Cigarettes  . Smokeless tobacco: Never Used  . Alcohol Use: No   OB History   Grav Para Term Preterm Abortions TAB SAB Ect Mult Living                 Review of Systems  Gastrointestinal: Positive for vomiting and abdominal pain.  Musculoskeletal: Positive for back pain.  All other systems reviewed and are negative.     Allergies  Review of patient's allergies indicates no known allergies.  Home Medications   Prior to Admission medications   Medication Sig Start Date End Date Taking? Authorizing Provider  acetaminophen (TYLENOL) 500 MG tablet Take 1,000 mg by mouth every 6 (six) hours as needed for headache.   Yes Historical Provider, MD  topiramate (TOPAMAX) 200 MG tablet  Take 200 mg by mouth every evening.   Yes Historical Provider, MD  cephALEXin (KEFLEX) 500 MG capsule Take 1 capsule (500 mg total) by mouth 4 (four) times daily. 01/25/14   Flint Melter, MD  ondansetron (ZOFRAN) 8 MG tablet Take 1 tablet (8 mg total) by mouth every 8 (eight) hours as needed for nausea or vomiting. 01/25/14   Flint Melter, MD   BP 102/68  Pulse 55  Temp(Src) 98.3 F (36.8 C) (Oral)  Resp 18  SpO2 100%  LMP 01/11/2014 Physical Exam  Nursing note and vitals reviewed. Constitutional: She is oriented to person, place, and time. She appears well-developed and well-nourished. She appears distressed (she is uncomfortable).  HENT:  Head: Normocephalic and atraumatic.  Eyes: Conjunctivae and EOM are normal. Pupils are equal, round, and reactive to light.  Neck: Normal range of motion and phonation normal. Neck supple.  Cardiovascular: Normal rate, regular rhythm and intact distal pulses.   Pulmonary/Chest: Effort normal and breath sounds normal. No respiratory distress. She exhibits no tenderness.  Abdominal: Soft. She exhibits no distension and no mass. There is tenderness (Right and left upper quadrant, mild). There is no rebound and no guarding.  Musculoskeletal: Normal range of motion.  Neurological: She is alert and oriented to person, place, and time. She exhibits normal muscle tone.  Skin: Skin is warm and dry.  Psychiatric: Her behavior is normal. Judgment and thought content normal.  Anxious, crying  ED Course  Procedures (including critical care time) 16:30- Family with patient; they were updated and had no questions.  Medications  0.9 %  sodium chloride infusion ( Intravenous New Bag/Given 01/25/14 1733)  sodium chloride 0.9 % bolus 1,000 mL (0 mLs Intravenous Stopped 01/25/14 1841)  morphine 4 MG/ML injection 4 mg (4 mg Intravenous Given 01/25/14 1646)  ondansetron (ZOFRAN) injection 4 mg (4 mg Intravenous Given 01/25/14 1646)  LORazepam (ATIVAN) injection 1  mg (1 mg Intravenous Given 01/25/14 1646)  cephALEXin (KEFLEX) capsule 500 mg (500 mg Oral Given 01/25/14 1904)    Patient Vitals for the past 24 hrs:  BP Temp Temp src Pulse Resp SpO2  01/25/14 1859 102/68 mmHg - - 55 18 100 %  01/25/14 1541 114/62 mmHg 98.3 F (36.8 C) Oral 77 16 100 %    4:41 PM Reevaluation with update and discussion. After initial assessment and treatment, an updated evaluation reveals she feels better and is now trialing oral intake. Findings discussed with pt, all questions answered.Mancel Bale. [name]   6:25 PM Reevaluation with update and discussion. After initial assessment and treatment, an updated evaluation reveals Tolerated oral ABX. [name] L   Labs Review Labs Reviewed  CBC WITH DIFFERENTIAL - Abnormal; Notable for the following:    Neutrophils Relative % 80 (*)    All other components within normal limits  COMPREHENSIVE METABOLIC PANEL - Abnormal; Notable for the following:    Potassium 3.4 (*)    CO2 18 (*)    Glucose, Bld 105 (*)    Creatinine, Ser 1.38 (*)    GFR calc non Af Amer 54 (*)    GFR calc Af Amer 62 (*)    Anion gap 18 (*)    All other components within normal limits  URINALYSIS, ROUTINE W REFLEX MICROSCOPIC - Abnormal; Notable for the following:    APPearance CLOUDY (*)    Nitrite POSITIVE (*)    Leukocytes, UA SMALL (*)    All other components within normal limits  URINE MICROSCOPIC-ADD ON - Abnormal; Notable for the following:    Squamous Epithelial / LPF FEW (*)    Bacteria, UA FEW (*)    All other components within normal limits  URINE CULTURE  LIPASE, BLOOD  PREGNANCY, URINE    Imaging Review No results found.   EKG Interpretation None      MDM   Final diagnoses:  Non-intractable vomiting with nausea, vomiting of unspecified type  Acute cystitis without hematuria    Nonspecific vomiting, with urinary tract infection. Doubt pyelonephritis, bacteremia or metabolic instability.   Nursing Notes Reviewed/  Care Coordinated Applicable Imaging Reviewed Interpretation of Laboratory Data incorporated into ED treatment  The patient appears reasonably screened and/or stabilized for discharge and I doubt any other medical condition or other [facility] requiring further screening, evaluation, or treatment in the ED at this time prior to discharge.  Plan: Home Medications- Keflex, Zofran; Home Treatments- Gradually advance diet; return here if the recommended treatment, does not improve the symptoms; Recommended follow up- PCP 1 week   Flint MelterElliott L Alixander Rallis, MD 01/25/14 1943

## 2014-01-25 NOTE — Progress Notes (Signed)
[facility] provided pt with a list of primary care resources and a [facility] Orange Card application to help patient establish primary care.  °

## 2014-01-25 NOTE — Discharge Instructions (Signed)
°  Gradually advance, your diet, beginning with clear liquids, this evening. Start the antibiotic prescription tomorrow morning.    Urinary Tract Infection Urinary tract infections (UTIs) can develop anywhere along your urinary tract. Your urinary tract is your body's drainage system for removing wastes and extra water. Your urinary tract includes two kidneys, two ureters, a bladder, and a urethra. Your kidneys are a pair of bean-shaped organs. Each kidney is about the size of your fist. They are located below your ribs, one on each side of your spine. CAUSES Infections are caused by microbes, which are microscopic organisms, including fungi, viruses, and bacteria. These organisms are so small that they can only be seen through a microscope. Bacteria are the microbes that most commonly cause UTIs. SYMPTOMS  Symptoms of UTIs may vary by age and gender of the patient and by the location of the infection. Symptoms in young women typically include a frequent and intense urge to urinate and a painful, burning feeling in the bladder or urethra during urination. Older women and men are more likely to be tired, shaky, and weak and have muscle aches and abdominal pain. A fever may mean the infection is in your kidneys. Other symptoms of a kidney infection include pain in your back or sides below the ribs, nausea, and vomiting. DIAGNOSIS To diagnose a UTI, your caregiver will ask you about your symptoms. Your caregiver also will ask to provide a urine sample. The urine sample will be tested for bacteria and white blood cells. White blood cells are made by your body to help fight infection. TREATMENT  Typically, UTIs can be treated with medication. Because most UTIs are caused by a bacterial infection, they usually can be treated with the use of antibiotics. The choice of antibiotic and length of treatment depend on your symptoms and the type of bacteria causing your infection. HOME CARE INSTRUCTIONS  If you  were prescribed antibiotics, take them exactly as your caregiver instructs you. Finish the medication even if you feel better after you have only taken some of the medication.  Drink enough water and fluids to keep your urine clear or pale yellow.  Avoid caffeine, tea, and carbonated beverages. They tend to irritate your bladder.  Empty your bladder often. Avoid holding urine for long periods of time.  Empty your bladder before and after sexual intercourse.  After a bowel movement, women should cleanse from front to back. Use each tissue only once. SEEK MEDICAL CARE IF:   You have back pain.  You develop a fever.  Your symptoms do not begin to resolve within 3 days. SEEK IMMEDIATE MEDICAL CARE IF:   You have severe back pain or lower abdominal pain.  You develop chills.  You have nausea or vomiting.  You have continued burning or discomfort with urination. MAKE SURE YOU:   Understand these instructions.  Will watch your condition.  Will get help right away if you are not doing well or get worse. Document Released: 04/10/2005 Document Revised: 12/31/2011 Document Reviewed: 08/09/2011 [facility] Patient Information 2015 [place]. This information is not intended to replace advice given to you by your health care provider. Make sure you discuss any questions you have with your health care provider.

## 2014-01-28 LAB — URINE CULTURE: Special Requests: NORMAL

## 2014-01-29 ENCOUNTER — Telehealth (HOSPITAL_COMMUNITY): Payer: Self-pay

## 2014-01-29 NOTE — ED Notes (Signed)
Post ED Visit - Positive Culture Follow-up  Culture report reviewed by antimicrobial stewardship pharmacist: []  Wes Dulaney, Pharm.D., BCPS [x]  Celedonio MiyamotoJeremy Frens, Pharm.D., BCPS []  Georgina PillionElizabeth Martin, [address] BCPS []  Dra[place] BranchMinh Pham, [address]Ph[address]., BCPS, AAHIVP []  Estella HuskMichelle Turner, Pharm.D., BCPS, AAHIVP []    Positive urine culture Treated with cephalexin, organism sensitive to the same and no further patient follow-up is required at this time.  Ashley JacobsFesterman, Breia Ocampo C 01/29/2014, 11:41 AM

## 2014-08-08 ENCOUNTER — Encounter (HOSPITAL_COMMUNITY): Payer: Self-pay | Admitting: Emergency Medicine

## 2014-08-08 ENCOUNTER — Emergency Department (HOSPITAL_COMMUNITY)
Admission: EM | Admit: 2014-08-08 | Discharge: 2014-08-08 | Disposition: A | Discharge status: Home / Self Care | Payer: Self-pay | Attending: Emergency Medicine | Admitting: Emergency Medicine

## 2014-08-08 DIAGNOSIS — G40909 Epilepsy, unspecified, not intractable, without status epilepticus: Secondary | ICD-10-CM | POA: Insufficient documentation

## 2014-08-08 DIAGNOSIS — Z79899 Other long term (current) drug therapy: Secondary | ICD-10-CM | POA: Insufficient documentation

## 2014-08-08 DIAGNOSIS — Z792 Long term (current) use of antibiotics: Secondary | ICD-10-CM | POA: Insufficient documentation

## 2014-08-08 DIAGNOSIS — Z87891 Personal history of nicotine dependence: Secondary | ICD-10-CM | POA: Insufficient documentation

## 2014-08-08 DIAGNOSIS — G43909 Migraine, unspecified, not intractable, without status migrainosus: Secondary | ICD-10-CM | POA: Insufficient documentation

## 2014-08-08 DIAGNOSIS — J069 Acute upper respiratory infection, unspecified: Secondary | ICD-10-CM | POA: Insufficient documentation

## 2014-08-08 DIAGNOSIS — R197 Diarrhea, unspecified: Secondary | ICD-10-CM | POA: Insufficient documentation

## 2014-08-08 LAB — RAPID STREP SCREEN (MED CTR MEBANE ONLY): STREPTOCOCCUS, GROUP A SCREEN (DIRECT): NEGATIVE

## 2014-08-08 MED ORDER — BENZOCAINE-MENTHOL 15-2.6 MG MT LOZG: 1.0000 | LOZENGE | Freq: Three times a day (TID) | OROMUCOSAL | Status: DC | PRN

## 2014-08-08 MED ORDER — GUAIFENESIN 100 MG/5ML PO LIQD: 100.0000 mg | ORAL | Status: DC | PRN

## 2014-08-08 NOTE — ED Notes (Signed)
Pt c/o sore throat x 3 days, has hx of strep. Hurts to swallow.

## 2014-08-08 NOTE — Discharge Instructions (Signed)
Upper Respiratory Infection, Adult An upper respiratory infection (URI) is also sometimes known as the common cold. The upper respiratory tract includes the nose, sinuses, throat, trachea, and bronchi. Bronchi are the airways leading to the lungs. Most people improve within 1 week, but symptoms can last up to 2 weeks. A residual cough may last even longer.  CAUSES Many different viruses can infect the tissues lining the upper respiratory tract. The tissues become irritated and inflamed and often become very moist. Mucus production is also common. A cold is contagious. You can easily spread the virus to others by oral contact. This includes kissing, sharing a glass, coughing, or sneezing. Touching your mouth or nose and then touching a surface, which is then touched by another person, can also spread the virus. SYMPTOMS  Symptoms typically develop 1 to 3 days after you come in contact with a cold virus. Symptoms vary from person to person. They may include:  Runny nose.  Sneezing.  Nasal congestion.  Sinus irritation.  Sore throat.  Loss of voice (laryngitis).  Cough.  Fatigue.  Muscle aches.  Loss of appetite.  Headache.  Low-grade fever. DIAGNOSIS  You might diagnose your own cold based on familiar symptoms, since most people get a cold 2 to 3 times a year. Your caregiver can confirm this based on your exam. Most importantly, your caregiver can check that your symptoms are not due to another disease such as strep throat, sinusitis, pneumonia, asthma, or epiglottitis. Blood tests, throat tests, and X-rays are not necessary to diagnose a common cold, but they may sometimes be helpful in excluding other more serious diseases. Your caregiver will decide if any further tests are required. RISKS AND COMPLICATIONS  You may be at risk for a more severe case of the common cold if you smoke cigarettes, have chronic heart disease (such as heart failure) or lung disease (such as asthma), or if  you have a weakened immune system. The very young and very old are also at risk for more serious infections. Bacterial sinusitis, middle ear infections, and bacterial pneumonia can complicate the common cold. The common cold can worsen asthma and chronic obstructive pulmonary disease (COPD). Sometimes, these complications can require emergency medical care and may be life-threatening. PREVENTION  The best way to protect against getting a cold is to practice good hygiene. Avoid oral or hand contact with people with cold symptoms. Wash your hands often if contact occurs. There is no clear evidence that vitamin C, vitamin E, echinacea, or exercise reduces the chance of developing a cold. However, it is always recommended to get plenty of rest and practice good nutrition. TREATMENT  Treatment is directed at relieving symptoms. There is no cure. Antibiotics are not effective, because the infection is caused by a virus, not by bacteria. Treatment may include:  Increased fluid intake. Sports drinks offer valuable electrolytes, sugars, and fluids.  Breathing heated mist or steam (vaporizer or shower).  Eating chicken soup or other clear broths, and maintaining good nutrition.  Getting plenty of rest.  Using gargles or lozenges for comfort.  Controlling fevers with ibuprofen or acetaminophen as directed by your caregiver.  Increasing usage of your inhaler if you have asthma. Zinc gel and zinc lozenges, taken in the first 24 hours of the common cold, can shorten the duration and lessen the severity of symptoms. Pain medicines may help with fever, muscle aches, and throat pain. A variety of non-prescription medicines are available to treat congestion and runny nose. Your caregiver   can make recommendations and may suggest nasal or lung inhalers for other symptoms.  HOME CARE INSTRUCTIONS   Only take over-the-counter or prescription medicines for pain, discomfort, or fever as directed by your  caregiver.  Use a warm mist humidifier or inhale steam from a shower to increase air moisture. This may keep secretions moist and make it easier to breathe.  Drink enough water and fluids to keep your urine clear or pale yellow.  Rest as needed.  Return to work when your temperature has returned to normal or as your caregiver advises. You may need to stay home longer to avoid infecting others. You can also use a face mask and careful hand washing to prevent spread of the virus. SEEK MEDICAL CARE IF:   After the first few days, you feel you are getting worse rather than better.  You need your caregiver's advice about medicines to control symptoms.  You develop chills, worsening shortness of breath, or brown or red sputum. These may be signs of pneumonia.  You develop yellow or brown nasal discharge or pain in the face, especially when you bend forward. These may be signs of sinusitis.  You develop a fever, swollen neck glands, pain with swallowing, or white areas in the back of your throat. These may be signs of strep throat. SEEK IMMEDIATE MEDICAL CARE IF:   You have a fever.  You develop severe or persistent headache, ear pain, sinus pain, or chest pain.  You develop wheezing, a prolonged cough, cough up blood, or have a change in your usual mucus (if you have chronic lung disease).  You develop sore muscles or a stiff neck. Document Released: 12/25/2000 Document Revised: 09/23/2011 Document Reviewed: 10/06/2013 Exit[employer] Patient Information 2015 [employer]. This information is not intended to replace advice given to you by your health care provider. Make sure you discuss any questions you have with your health care provider.  

## 2014-08-08 NOTE — ED Provider Notes (Signed)
CSN: [id number]     Arrival date & time 08/08/14  1222 History  This chart was scribed for non-physician practitioner, Fayrene HelperBowie Shyan Scalisi, PA-C working with Linwood DibblesJon Knapp, MD by Greggory StallionKayla Andersen, ED scribe. This patient was seen in room WTR6/WTR6 and the patient's care was started at 1:01 PM.   Chief Complaint  Patient presents with  . Sore Throat   The history is provided by the patient. No language interpreter was used.    HPI Comments: Lisa Shaw is a 24 y.o. female who presents to the Emergency Department complaining of sore throat that started 3 days ago. Swallowing worsens pain. Also reports rhinorrhea, congestion, cough, subjective fever, and decreased appetite. States she has also had loose stools. Pt has taken NyQuil, DayQuil and used cough drops with no relief. Denies chest pain, SOB, abdominal cramping, emesis, hematochezia, generalized body aches, rash.   Past Medical History  Diagnosis Date  . Seizures   . Epilepsy   . Migraine    History reviewed. No pertinent past surgical history. Family History  Problem Relation Age of Onset  . Seizures Paternal Uncle     great grandmother   History  Substance Use Topics  . Smoking status: Former Smoker -- 0.30 packs/day    Types: Cigarettes  . Smokeless tobacco: Never Used  . Alcohol Use: No   OB History    No data available     Review of Systems  Constitutional: Positive for fever (subjective) and appetite change.  HENT: Positive for congestion, rhinorrhea and sore throat.   Respiratory: Positive for cough. Negative for shortness of breath.   Cardiovascular: Negative for chest pain.  Gastrointestinal: Positive for diarrhea. Negative for vomiting, abdominal pain and blood in stool.  Musculoskeletal: Negative for myalgias.  Skin: Negative for rash.  All other systems reviewed and are negative.  Allergies  Review of patient's allergies indicates no known allergies.  Home Medications   Prior to Admission medications    Medication Sig Start Date End Date Taking? Authorizing Provider  acetaminophen (TYLENOL) 500 MG tablet Take 1,000 mg by mouth every 6 (six) hours as needed for headache.    Historical Provider, MD  cephALEXin (KEFLEX) 500 MG capsule Take 1 capsule (500 mg total) by mouth 4 (four) times daily. 01/25/14   Flint MelterElliott L Wentz, MD  ondansetron (ZOFRAN) 8 MG tablet Take 1 tablet (8 mg total) by mouth every 8 (eight) hours as needed for nausea or vomiting. 01/25/14   Flint MelterElliott L Wentz, MD  topiramate (TOPAMAX) 200 MG tablet Take 200 mg by mouth every evening.    Historical Provider, MD   BP 108/85 mmHg  Pulse 84  Temp(Src) 98 F (36.7 C) (Oral)  Resp 16  SpO2 100%  LMP 08/02/2014 (Exact Date)   Physical Exam  Constitutional: She is oriented to person, place, and time. She appears well-developed and well-nourished. No distress.  HENT:  Head: Normocephalic and atraumatic.  Mild rhinorrhea. Uvula midline. No tonsillar enlargement or exudate. Mild post oropharyngeal erythema.   Eyes: Conjunctivae and EOM are normal.  Neck: Neck supple. No tracheal deviation present.  Cardiovascular: Normal rate.   Pulmonary/Chest: Effort normal. No respiratory distress.  Musculoskeletal: Normal range of motion.  Lymphadenopathy:    She has cervical adenopathy.  Neurological: She is alert and oriented to person, place, and time.  Skin: Skin is warm and dry.  Psychiatric: She has a normal mood and affect. Her behavior is normal.  Nursing note and vitals reviewed.   ED Course  Procedures (including critical care time)  DIAGNOSTIC STUDIES: Oxygen Saturation is 100% on RA, normal by my interpretation.    COORDINATION OF CARE: 1:03 PM-Discussed treatment plan which includes rapid strep test with pt at bedside and pt agreed to plan. Advised pt that if rapid strep is negative, she likely has a viral illness and will be discharged home with symptomatic treatment.   Labs Review Labs Reviewed  RAPID STREP SCREEN   CULTURE, GROUP A STREP    Imaging Review No results found.   EKG Interpretation None      MDM   Final diagnoses:  URI (upper respiratory infection)    BP 108/85 mmHg  Pulse 84  Temp(Src) 98 F (36.7 C) (Oral)  Resp 16  SpO2 100%  LMP 08/02/2014 (Exact Date)   I personally performed the services described in this documentation, which was scribed in my presence. The recorded information has been reviewed and is accurate.  Fayrene Helper, PA-C 08/08/14 1336  Linwood Dibbles, MD 08/09/14 [phone]

## 2014-08-10 LAB — CULTURE, GROUP A STREP

## 2015-07-23 ENCOUNTER — Encounter (HOSPITAL_COMMUNITY): Payer: Self-pay

## 2015-07-23 ENCOUNTER — Emergency Department (HOSPITAL_COMMUNITY)
Admission: EM | Admit: 2015-07-23 | Discharge: 2015-07-23 | Disposition: A | Discharge status: Home / Self Care | Payer: Self-pay | Attending: Emergency Medicine | Admitting: Emergency Medicine

## 2015-07-23 DIAGNOSIS — Z792 Long term (current) use of antibiotics: Secondary | ICD-10-CM | POA: Insufficient documentation

## 2015-07-23 DIAGNOSIS — Z79899 Other long term (current) drug therapy: Secondary | ICD-10-CM | POA: Insufficient documentation

## 2015-07-23 DIAGNOSIS — H9202 Otalgia, left ear: Secondary | ICD-10-CM | POA: Insufficient documentation

## 2015-07-23 DIAGNOSIS — J3489 Other specified disorders of nose and nasal sinuses: Secondary | ICD-10-CM | POA: Insufficient documentation

## 2015-07-23 DIAGNOSIS — F17210 Nicotine dependence, cigarettes, uncomplicated: Secondary | ICD-10-CM | POA: Insufficient documentation

## 2015-07-23 DIAGNOSIS — H00016 Hordeolum externum left eye, unspecified eyelid: Secondary | ICD-10-CM

## 2015-07-23 DIAGNOSIS — H00014 Hordeolum externum left upper eyelid: Secondary | ICD-10-CM | POA: Insufficient documentation

## 2015-07-23 DIAGNOSIS — G43909 Migraine, unspecified, not intractable, without status migrainosus: Secondary | ICD-10-CM | POA: Insufficient documentation

## 2015-07-23 MED ORDER — FLUTICASONE PROPIONATE 50 MCG/ACT NA SUSP: 2.0000 | Freq: Every day | NASAL | Status: DC

## 2015-07-23 NOTE — ED Notes (Signed)
Patient c/o feeling like something crawling in her left ear and mild discomfort. Patient also c/o left eyelid tenderness since last night.

## 2015-07-23 NOTE — Discharge Instructions (Signed)
1. Medications: flonase, usual home medications 2. Treatment: rest, drink plenty of fluids,  3. Follow Up: Please followup with your primary doctor in 2 days for discussion of your diagnoses and further evaluation after today's visit; if you do not have a primary care doctor use the resource guide provided to find one; Please return to the ER for worsening symptoms, vision changes    Stye A stye is a bump on your eyelid caused by a bacterial infection. A stye can form inside the eyelid (internal stye) or outside the eyelid (external stye). An internal stye may be caused by an infected oil-producing gland inside your eyelid. An external stye may be caused by an infection at the base of your eyelash (hair follicle). Styes are very common. Anyone can get them at any age. They usually occur in just one eye, but you may have more than one in either eye.  CAUSES  The infection is almost always caused by bacteria called Staphylococcus aureus. This is a common type of bacteria that lives on your skin. RISK FACTORS You may be at higher risk for a stye if you have had one before. You may also be at higher risk if you have:  Diabetes.  Long-term illness.  Long-term eye redness.  A skin condition called seborrhea.  High fat levels in your blood (lipids). SIGNS AND SYMPTOMS  Eyelid pain is the most common symptom of a stye. Internal styes are more painful than external styes. Other signs and symptoms may include:  Painful swelling of your eyelid.  A scratchy feeling in your eye.  Tearing and redness of your eye.  Pus draining from the stye. DIAGNOSIS  Your health care provider may be able to diagnose a stye just by examining your eye. The health care provider may also check to make sure:  You do not have a fever or other signs of a more serious infection.  The infection has not spread to other parts of your eye or areas around your eye. TREATMENT  Most styes will clear up in a few days  without treatment. In some cases, you may need to use antibiotic drops or ointment to prevent infection. Your health care provider may have to drain the stye surgically if your stye is:  Large.  Causing a lot of pain.  Interfering with your vision. This can be done using a thin blade or a needle.  HOME CARE INSTRUCTIONS   Take medicines only as directed by your health care provider.  Apply a clean, warm compress to your eye for 10 minutes, 4 times a day.  Do not wear contact lenses or eye makeup until your stye has healed.  Do not try to pop or drain the stye. SEEK MEDICAL CARE IF:  You have chills or a fever.  Your stye does not go away after several days.  Your stye affects your vision.  Your eyeball becomes swollen, red, or painful. MAKE SURE YOU:  Understand these instructions.  Will watch your condition.  Will get help right away if you are not doing well or get worse.   This information is not intended to replace advice given to you by your health care provider. Make sure you discuss any questions you have with your health care provider.   Document Released: 04/10/2005 Document Revised: 07/22/2014 Document Reviewed: 10/15/2013 Elsevier Interactive Patient Education 2016 Elsevier Inc.    Earache An earache, also called otalgia, can be caused by many things. Pain from an earache can  be sharp, dull, or burning. The pain may be temporary or constant. Earaches can be caused by problems with the ear, such as infection in either the middle ear or the ear canal, injury, impacted ear wax, middle ear pressure, or a foreign body in the ear. Ear pain can also result from problems in other areas. This is called referred pain. For example, pain can come from a sore throat, a tooth infection, or problems with the jaw or the joint between the jaw and the skull (temporomandibular joint, or TMJ). The cause of an earache is not always easy to identify. Watchful waiting may be  appropriate for some earaches until a clear cause of the pain can be found. HOME CARE INSTRUCTIONS Watch your condition for any changes. The following actions may help to lessen any discomfort that you are feeling:  Take medicines only as directed by your health care provider. This includes ear drops.  Apply ice to your outer ear to help reduce pain.  Put ice in a plastic bag.  Place a towel between your skin and the bag.  Leave the ice on for 20 minutes, 2-3 times per day.  Do not put anything in your ear other than medicine that is prescribed by your health care provider.  Try resting in an upright position instead of lying down. This may help to reduce pressure in the middle ear and relieve pain.  Chew gum if it helps to relieve your ear pain.  Control any allergies that you have.  Keep all follow-up visits as directed by your health care provider. This is important. SEEK MEDICAL CARE IF:  Your pain does not improve within 2 days.  You have a fever.  You have new or worsening symptoms. SEEK IMMEDIATE MEDICAL CARE IF:  You have a severe headache.  You have a stiff neck.  You have difficulty swallowing.  You have redness or swelling behind your ear.  You have drainage from your ear.  You have hearing loss.  You feel dizzy.   This information is not intended to replace advice given to you by your health care provider. Make sure you discuss any questions you have with your health care provider.   Document Released: 02/16/2004 Document Revised: 07/22/2014 Document Reviewed: 01/30/2014 Elsevier Interactive Patient Education [employer] Elsevier Inc.

## 2015-07-23 NOTE — ED Notes (Signed)
Bed: WLPT2 Expected date:  Expected time:  Means of arrival:  Comments: Housekeeping

## 2015-07-23 NOTE — ED Provider Notes (Signed)
CSN: [id number]     Arrival date & time 07/23/15  1256 History  By signing my name below, I, [facility] Center[facility]arrissa Washington, attest that this documentation has been prepared under the direction and in the presence of TXU CorpHannah Monae Topping, PA-C. Electronically Signed: Randell PatientMarrissa Washington, ED Scribe. 07/23/2015. 1:53 PM.   Chief Complaint  Patient presents with  . Otalgia  . Eye Pain   The history is provided by the patient and medical records. No language interpreter was used.   HPI Comments: Lisa Shaw is a 25 y.o. female who presents to the Emergency Department complaining of partially resolved, gradually improving, intermittent, mild left ear discomfort onset 10.5 hours ago. Patient reports that she was lying in bed when she felt and heard something enter her ear, followed by intermittent scratching sounds in her ear, but that she did not see a bug or any object after shaking out her hair bonnet and pillow 8.5 hours ago. She notes that the sounds she was hearing only presented while lying on her side.  She has not tried any treatments. Per patient, she denies insertion of foreign objects in her ears aside from Q-tips. She denies nasal congestion.  Patient also complaining of constant, mild, unchanging left eyelid pain onset last night. She has not tried any treatments. Patient denies rubbing eye.   Past Medical History  Diagnosis Date  . Seizures (HCC)   . Epilepsy (HCC)   . Migraine    History reviewed. No pertinent past surgical history. Family History  Problem Relation Age of Onset  . Seizures Paternal Uncle     great grandmother   Social History  Substance Use Topics  . Smoking status: Current Every Day Smoker -- 0.25 packs/day    Types: Cigarettes  . Smokeless tobacco: Never Used  . Alcohol Use: No   OB History    No data available     Review of Systems  Constitutional: Negative for fever.  HENT: Positive for ear pain (Left). Negative for congestion and mouth sores.   Eyes:  Positive for pain (Left eyelid). Negative for visual disturbance.  Gastrointestinal: Negative for nausea and vomiting.  Musculoskeletal: Negative for neck stiffness.  Skin: Negative for rash.  Allergic/Immunologic: Negative for immunocompromised state.  Neurological: Negative for syncope and headaches.  Hematological: Does not bruise/bleed easily.      Allergies  Review of patient's allergies indicates no known allergies.  Home Medications   Prior to Admission medications   Medication Sig Start Date End Date Taking? Authorizing Provider  acetaminophen (TYLENOL) 500 MG tablet Take 1,000 mg by mouth every 6 (six) hours as needed for headache.    Historical Provider, MD  Benzocaine-Menthol ([facility] SORE THROAT MAX STRENGTH) 15-2.6 MG LOZG Use as directed 1 lozenge in the mouth or throat 3 (three) times daily as needed. 08/08/14   Fayrene HelperBowie Tran, PA-C  cephALEXin (KEFLEX) 500 MG capsule Take 1 capsule (500 mg total) by mouth 4 (four) times daily. 01/25/14   Mancel BaleElliott Wentz, MD  fluticasone (FLONASE) 50 MCG/ACT nasal spray Place 2 sprays into both nostrils daily. 07/23/15   Mithran Strike, PA-C  guaiFENesin (ROBITUSSIN) 100 MG/5ML liquid Take 5-10 mLs (100-200 mg total) by mouth every 4 (four) hours as needed for cough. 08/08/14   Fayrene HelperBowie Tran, PA-C  ondansetron (ZOFRAN) 8 MG tablet Take 1 tablet (8 mg total) by mouth every 8 (eight) hours as needed for nausea or vomiting. 01/25/14   Mancel BaleElliott Wentz, MD  topiramate (TOPAMAX) 200 MG tablet Take 200 mg by  mouth every evening.    Historical Provider, MD   BP 114/71 mmHg  Pulse 94  Temp(Src) 98.3 F (36.8 C)  Resp 16  Ht 5\' 7"  (1.702 m)  Wt 140 lb (63.504 kg)  BMI 21.92 kg/m2  SpO2 100%  LMP 07/16/2015 Physical Exam  Constitutional: She is oriented to person, place, and time. She appears well-developed and well-nourished. No distress.  HENT:  Head: Normocephalic and atraumatic.  Right Ear: Tympanic membrane, external ear and ear canal normal. No  tenderness. Tympanic membrane is not erythematous and not bulging.  Left Ear: Tympanic membrane, external ear and ear canal normal. No tenderness. Tympanic membrane is not erythematous and not bulging.  Nose: Mucosal edema and rhinorrhea present. No epistaxis. Right sinus exhibits no maxillary sinus tenderness and no frontal sinus tenderness. Left sinus exhibits no maxillary sinus tenderness and no frontal sinus tenderness.  Mouth/Throat: Uvula is midline and mucous membranes are normal. Mucous membranes are not pale and not cyanotic. No oropharyngeal exudate, posterior oropharyngeal edema, posterior oropharyngeal erythema or tonsillar abscesses.  No foreign bodies in either canals, canals totally clear. Small amount of clear fluid noted behind the left TM without erythema or bulging  Eyes: Conjunctivae and EOM are normal. Pupils are equal, round, and reactive to light. Right eye exhibits no discharge. Left eye exhibits hordeolum. Left eye exhibits no discharge. Right conjunctiva is not injected. Left conjunctiva is not injected. No scleral icterus.    Neck: Normal range of motion and full passive range of motion without pain.  Cardiovascular: Normal rate and intact distal pulses.   Pulmonary/Chest: Effort normal and breath sounds normal. No stridor.  Clear and equal breath sounds without focal wheezes, rhonchi, rales  Abdominal: Soft. Bowel sounds are normal. There is no tenderness.  Musculoskeletal: Normal range of motion.  Lymphadenopathy:    She has no cervical adenopathy.  Neurological: She is alert and oriented to person, place, and time.  Skin: Skin is warm and dry. No rash noted. She is not diaphoretic.  Psychiatric: She has a normal mood and affect.  Nursing note and vitals reviewed.   ED Course  Procedures   DIAGNOSTIC STUDIES: Oxygen Saturation is 100% on RA, normal by my interpretation.    COORDINATION OF CARE: 1:34 PM Advised pt to use warm compresses on her eye and ear.  Discussed applying oil to the left ear. Discussed treatment plan with pt at bedside and pt agreed to plan.    MDM   Final diagnoses:  Otalgia, left  Hordeolum external, left   Lisa Shaw presents with multiple complaints.  She complains of a feeling of something crawling in her left ear.  Small amount of clear fluid collection behind the left TM but no foreign body or cerumen occlusion.  Patient denies rhinorrhea or nasal congestion however will give Flonase.  She also with small area of erythema and edema to the left eyelid. The most consistent with early hordeolum.  No evidence of internal hordeolum. Recommend warm compresses and monitoring. Patient is to follow-up with primary care physician within 2 days for further evaluation. Strict return precautions given for fevers, chills, vision changes or other concerns.  I personally performed the services described in this documentation, which was scribed in my presence. The recorded information has been reviewed and is accurate.   Dahlia Client Thornton Dohrmann, PA-C 07/23/15 1401  Rolland Porter, MD 07/27/15 [phone]

## 2015-10-21 ENCOUNTER — Emergency Department (HOSPITAL_COMMUNITY)
Admission: EM | Admit: 2015-10-21 | Discharge: 2015-10-21 | Disposition: A | Discharge status: Home / Self Care | Payer: Self-pay | Attending: Emergency Medicine | Admitting: Emergency Medicine

## 2015-10-21 ENCOUNTER — Encounter (HOSPITAL_COMMUNITY): Payer: Self-pay

## 2015-10-21 DIAGNOSIS — G43909 Migraine, unspecified, not intractable, without status migrainosus: Secondary | ICD-10-CM | POA: Insufficient documentation

## 2015-10-21 DIAGNOSIS — Z3202 Encounter for pregnancy test, result negative: Secondary | ICD-10-CM | POA: Insufficient documentation

## 2015-10-21 DIAGNOSIS — Z7952 Long term (current) use of systemic steroids: Secondary | ICD-10-CM | POA: Insufficient documentation

## 2015-10-21 DIAGNOSIS — Z79899 Other long term (current) drug therapy: Secondary | ICD-10-CM | POA: Insufficient documentation

## 2015-10-21 DIAGNOSIS — R569 Unspecified convulsions: Secondary | ICD-10-CM | POA: Insufficient documentation

## 2015-10-21 DIAGNOSIS — Z792 Long term (current) use of antibiotics: Secondary | ICD-10-CM | POA: Insufficient documentation

## 2015-10-21 DIAGNOSIS — F17210 Nicotine dependence, cigarettes, uncomplicated: Secondary | ICD-10-CM | POA: Insufficient documentation

## 2015-10-21 LAB — I-STAT BETA HCG BLOOD, ED (MC, WL, AP ONLY)

## 2015-10-21 MED ORDER — TOPIRAMATE 25 MG PO TABS
200.0000 mg | ORAL_TABLET | Freq: Once | ORAL | Status: AC
  Administered 2015-10-21: 200 mg via ORAL
  Filled 2015-10-21: qty 8

## 2015-10-21 MED ORDER — TOPIRAMATE 200 MG PO TABS
200.0000 mg | ORAL_TABLET | Freq: Once | ORAL | Status: DC
Start: 2015-10-21 — End: 2016-03-28

## 2015-10-21 NOTE — Discharge Instructions (Signed)
Seizure, Adult Lisa Shaw, see neurology within 3 days for close follow up of your seizures.  Take your medication everyday to prevent seizures.  For any worsening come back to the ED immediately. Thank you. A seizure means there is unusual activity in the brain. A seizure can cause changes in attention or behavior. Seizures often cause shaking (convulsions). Seizures often last from 30 seconds to 2 minutes. HOME CARE   If you are given medicines, take them exactly as told by your doctor.  Keep all doctor visits as told.  Do not swim or drive until your doctor says it is okay.  Teach others what to do if you have a seizure. They should:  Lay you on the ground.  Put a cushion under your head.  Loosen any tight clothing around your neck.  Turn you on your side.  Stay with you until you get better. GET HELP RIGHT AWAY IF:   The seizure lasts longer than 2 to 5 minutes.  The seizure is very bad.  The person does not wake up after the seizure.  The person's attention or behavior changes. Drive the person to the emergency room or call your local emergency services (911 in [place] MAKE SURE YOU:   Understand these instructions.  Will watch your condition.  Will get help right away if you are not doing well or get worse.   This information is not intended to replace advice given to you by your health care provider. Make sure you discuss any questions you have with your health care provider.   Document Released: 12/18/2007 Document Revised: 09/23/2011 Document Reviewed: 02/10/2013 Elsevier Interactive Patient Education [employer] Else[employer] Inc.

## 2015-10-21 NOTE — ED Notes (Signed)
Patient stable and ambulatory. Verbalized understanding of the discharge instructions.   

## 2015-10-21 NOTE — ED Provider Notes (Signed)
CSN: [id number]     Arrival date & time 10/21/15  0346 History  By signing my name below, I, Arianna Nassar, attest that this documentation has been prepared under the direction and in the presence of [name] Crumble, MD. Electronically Signed: Octavia Heir, ED Scribe. 10/21/2015. 4:01 AM.    Chief Complaint  Patient presents with  . Seizures      The history is provided by the patient. No language interpreter was used.   HPI Comments: Lisa Shaw is a 25 y.o. female who presents to the Emergency Department via EMS complaining of sudden onset seizure this evening. Per pt, her mother found her in a post ictal period of a seizure when she came home from work. Her episode lasted about 3 minutes. Pt says she was confused afterward. She reports being on Topamax but ran out of it two days ago.She denies biting her tongue, fever, rhinorrhea, coughing, diarrhea, or vomiting. Past Medical History  Diagnosis Date  . Seizures (HCC)   . Epilepsy (HCC)   . Migraine    No past surgical history on file. Family History  Problem Relation Age of Onset  . Seizures Paternal Uncle     great grandmother   Social History  Substance Use Topics  . Smoking status: Current Every Day Smoker -- 0.25 packs/day    Types: Cigarettes  . Smokeless tobacco: Never Used  . Alcohol Use: No   OB History    No data available     Review of Systems  Neurological: Positive for seizures.    A complete 10 system review of systems was obtained and all systems are negative except as noted in the HPI and PMH.    Allergies  Review of patient's allergies indicates no known allergies.  Home Medications   Prior to Admission medications   Medication Sig Start Date End Date Taking? Authorizing Provider  acetaminophen (TYLENOL) 500 MG tablet Take 1,000 mg by mouth every 6 (six) hours as needed for headache.    Historical Provider, MD  Benzocaine-Menthol ([facility] SORE THROAT MAX STRENGTH) 15-2.6 MG LOZG Use as directed 1  lozenge in the mouth or throat 3 (three) times daily as needed. 08/08/14   Fayrene Helper, PA-C  cephALEXin (KEFLEX) 500 MG capsule Take 1 capsule (500 mg total) by mouth 4 (four) times daily. 01/25/14   Mancel Bale, MD  fluticasone (FLONASE) 50 MCG/ACT nasal spray Place 2 sprays into both nostrils daily. 07/23/15   Hannah Muthersbaugh, PA-C  guaiFENesin (ROBITUSSIN) 100 MG/5ML liquid Take 5-10 mLs (100-200 mg total) by mouth every 4 (four) hours as needed for cough. 08/08/14   Fayrene Helper, PA-C  ondansetron (ZOFRAN) 8 MG tablet Take 1 tablet (8 mg total) by mouth every 8 (eight) hours as needed for nausea or vomiting. 01/25/14   Mancel Bale, MD  topiramate (TOPAMAX) 200 MG tablet Take 200 mg by mouth every evening.    Historical Provider, MD   Triage vitals: BP 102/69 mmHg  Pulse 89  Temp(Src) 98.7 F (37.1 C)  Resp 20  SpO2 100% Physical Exam  Constitutional: She is oriented to person, place, and time. She appears well-developed and well-nourished. No distress.  HENT:  Head: Normocephalic and atraumatic.  Nose: Nose normal.  Mouth/Throat: Oropharynx is clear and moist. No oropharyngeal exudate.  Eyes: Conjunctivae and EOM are normal. Pupils are equal, round, and reactive to light. No scleral icterus.  Neck: Normal range of motion. Neck supple. No JVD present. No tracheal deviation present. No thyromegaly present.  Cardiovascular: Normal rate, regular rhythm and normal heart sounds.  Exam reveals no gallop and no friction rub.   No murmur heard. Pulmonary/Chest: Effort normal and breath sounds normal. No respiratory distress. She has no wheezes. She exhibits no tenderness.  Abdominal: Soft. Bowel sounds are normal. She exhibits no distension and no mass. There is no tenderness. There is no rebound and no guarding.  Musculoskeletal: Normal range of motion. She exhibits no edema or tenderness.  Lymphadenopathy:    She has no cervical adenopathy.  Neurological: She is alert and oriented to person,  place, and time. No cranial nerve deficit. She exhibits normal muscle tone.  Normal strength and sensation, normal cerebellar testing  Skin: Skin is warm and dry. No rash noted. No erythema. No pallor.  Nursing note and vitals reviewed.   ED Course  Procedures  DIAGNOSTIC STUDIES: Oxygen Saturation is100% on RA, normal by my interpretation.  COORDINATION OF CARE:  4:01 AM Discussed treatment plan with pt at bedside and pt agreed to plan.  Labs Review Labs Reviewed - No data to display  Imaging Review No results found. I have personally reviewed and evaluated these images and lab results as part of my medical decision-making.   EKG Interpretation None      MDM   Final diagnoses:  None   Patient presents to the ED for seizure which occurred while she was sleeping.  She normally takes topamax and has been out of medications for the past 2 days.  She states it is an insurance issue but she can fill the meds with a new Rx.  This was provided for her.  She was given a dose in the ED as well.  Neuro follow up was advised within 3 days.  She denies any recent infection, N/V/D.  Likely cause is noncompliance.  She appears well and in NAD.  She is not postictal currently. VS remain within her normal limits and she is safe for DC.   I personally performed the services described in this documentation, which was scribed in my presence. The recorded information has been reviewed and is accurate.     [name] CrumbleAdeleke Richy Spradley, MD 10/21/15 [phone]

## 2015-10-21 NOTE — ED Notes (Signed)
Patient arrived via EMS after finding patient in the post ictal period of a seizure.  Patients mom called EMS after seeing the seizure. Stated it lasted for about 3 minutes where the patient was confused afterwards. Ambulated to the stretcher. Patient is now alert and oriented, very sleepy.   Patient reports to be on seizure medications, but ran out 2 days ago.  Has not been able to have the prescription filled yet. Did not fall was found in bed.

## 2016-01-29 ENCOUNTER — Encounter (HOSPITAL_COMMUNITY): Payer: Self-pay

## 2016-01-29 ENCOUNTER — Emergency Department (HOSPITAL_COMMUNITY)
Admission: EM | Admit: 2016-01-29 | Discharge: 2016-01-29 | Disposition: A | Discharge status: Home / Self Care | Payer: Self-pay | Attending: Emergency Medicine | Admitting: Emergency Medicine

## 2016-01-29 DIAGNOSIS — Z79899 Other long term (current) drug therapy: Secondary | ICD-10-CM | POA: Insufficient documentation

## 2016-01-29 DIAGNOSIS — F17210 Nicotine dependence, cigarettes, uncomplicated: Secondary | ICD-10-CM | POA: Insufficient documentation

## 2016-01-29 DIAGNOSIS — Z791 Long term (current) use of non-steroidal anti-inflammatories (NSAID): Secondary | ICD-10-CM | POA: Insufficient documentation

## 2016-01-29 DIAGNOSIS — L0231 Cutaneous abscess of buttock: Secondary | ICD-10-CM | POA: Insufficient documentation

## 2016-01-29 DIAGNOSIS — Z8669 Personal history of other diseases of the nervous system and sense organs: Secondary | ICD-10-CM | POA: Insufficient documentation

## 2016-01-29 DIAGNOSIS — R21 Rash and other nonspecific skin eruption: Secondary | ICD-10-CM | POA: Insufficient documentation

## 2016-01-29 MED ORDER — LIDOCAINE-EPINEPHRINE-TETRACAINE (LET) SOLUTION
3.0000 mL | Freq: Once | NASAL | Status: AC
  Administered 2016-01-29: 3 mL via TOPICAL
  Filled 2016-01-29: qty 3

## 2016-01-29 MED ORDER — MORPHINE SULFATE (PF) 4 MG/ML IV SOLN
4.0000 mg | Freq: Once | INTRAVENOUS | Status: AC
  Administered 2016-01-29: 4 mg via INTRAMUSCULAR
  Filled 2016-01-29: qty 1

## 2016-01-29 MED ORDER — LIDOCAINE-EPINEPHRINE 2 %-1:100000 IJ SOLN: 10.0000 mL | Freq: Once | INTRAMUSCULAR | Status: DC

## 2016-01-29 MED ORDER — LIDOCAINE-EPINEPHRINE 1 %-1:100000 IJ SOLN: 20.0000 mL | Freq: Once | INTRAMUSCULAR | Status: DC

## 2016-01-29 MED ORDER — SULFAMETHOXAZOLE-TRIMETHOPRIM 800-160 MG PO TABS: 1.0000 | ORAL_TABLET | Freq: Two times a day (BID) | ORAL | Status: DC

## 2016-01-29 MED ORDER — LIDOCAINE-EPINEPHRINE 1 %-1:100000 IJ SOLN
INTRAMUSCULAR | Status: AC
  Administered 2016-01-29: 1 mL
  Filled 2016-01-29: qty 1

## 2016-01-29 NOTE — ED Provider Notes (Signed)
CSN: [id number]     Arrival date & time 01/29/16  4098 History   First MD Initiated Contact with Patient 01/29/16 [phone]     Chief Complaint  Patient presents with  . Abscess     (Consider location/radiation/quality/duration/timing/severity/associated sxs/prior Treatment) HPI   Blood pressure 130/88, pulse 110, temperature 98.8 F (37.1 C), temperature source Oral, resp. rate 18, last menstrual period 01/15/2016, SpO2 100 %.  Lisa Shaw is a 25 y.o. female complaining of abscess to right buttock which she noticed several days ago, pain is severe, no active drainage (note that this contradicts triage note. She's never needed an incision and drainage before. She has no pain with bowel movements. She denies fever, chills, nausea, vomiting, abdominal pain.  Past Medical History  Diagnosis Date  . Seizures (HCC)   . Epilepsy (HCC)   . Migraine    History reviewed. No pertinent past surgical history. Family History  Problem Relation Age of Onset  . Seizures Paternal Uncle     great grandmother   Social History  Substance Use Topics  . Smoking status: Current Every Day Smoker -- 0.25 packs/day    Types: Cigarettes  . Smokeless tobacco: Never Used  . Alcohol Use: No   OB History    No data available     Review of Systems  10 systems reviewed and found to be negative, except as noted in the HPI.   Allergies  Review of patient's allergies indicates no known allergies.  Home Medications   Prior to Admission medications   Medication Sig Start Date End Date Taking? Authorizing Provider  acetaminophen (TYLENOL) 500 MG tablet Take 1,000 mg by mouth every 6 (six) hours as needed for mild pain, moderate pain, fever or headache.    Yes Historical Provider, MD  topiramate (TOPAMAX) 100 MG tablet Take 100 mg by mouth at bedtime.   Yes Historical Provider, MD  sulfamethoxazole-trimethoprim (BACTRIM DS) 800-160 MG tablet Take 1 tablet by mouth 2 (two) times daily. 01/29/16    Detrick Dani, PA-C  topiramate (TOPAMAX) 200 MG tablet Take 1 tablet (200 mg total) by mouth once. Patient not taking: Reported on 01/29/2016 10/21/15   [name] Crumble, MD   BP 130/88 mmHg  Pulse 110  Temp(Src) 98.8 F (37.1 C) (Oral)  Resp 18  SpO2 100%  LMP 01/15/2016 Physical Exam  Constitutional: She is oriented to person, place, and time. She appears well-developed and well-nourished. No distress.  HENT:  Head: Normocephalic.  Eyes: Conjunctivae and EOM are normal.  Cardiovascular: Normal rate, regular rhythm and intact distal pulses.   Pulmonary/Chest: Effort normal. No stridor.  Genitourinary:     Musculoskeletal: Normal range of motion.  Neurological: She is alert and oriented to person, place, and time.  Skin: Rash noted.  Psychiatric: She has a normal mood and affect.  Nursing note and vitals reviewed.   ED Course  .Marland KitchenIncision and Drainage Date/Time: 01/29/2016 8:44 AM Performed by: Wynetta Emery Authorized by: Wynetta Emery Consent: Verbal consent obtained. Risks and benefits: risks, benefits and alternatives were discussed Consent given by: patient Required items: required blood products, implants, devices, and special equipment available Type: abscess Body area: anogenital Location details: gluteal cleft Anesthesia: local infiltration Local anesthetic: lidocaine 2% with epinephrine Anesthetic total: 5 ml Patient sedated: no Risk factor: underlying major vessel Scalpel size: 11 Incision type: elliptical Incision depth: dermal Complexity: simple Drainage: purulent and  bloody Drainage amount: moderate Wound treatment: wound left open Packing material: none Patient tolerance: Patient tolerated the  procedure well with no immediate complications   (including critical care time) Labs Review Labs Reviewed - No data to display  Imaging Review No results found. I have personally reviewed and evaluated these images and lab results as part of my  medical decision-making.   EKG Interpretation None      MDM   Final diagnoses:  Gluteal abscess    Filed Vitals:   01/29/16 0747  BP: 130/88  Pulse: 110  Temp: 98.8 F (37.1 C)  TempSrc: Oral  Resp: 18  SpO2: 100%    Medications  lidocaine-EPINEPHrine (XYLOCAINE W/EPI) 1 %-1:100000 (with pres) injection 20 mL (0 mLs Intradermal Hold 01/29/16 0907)  morphine 4 MG/ML injection 4 mg (4 mg Intramuscular Given 01/29/16 0903)  lidocaine-EPINEPHrine-tetracaine (LET) solution (3 mLs Topical Given 01/29/16 0903)  lidocaine-EPINEPHrine (XYLOCAINE W/EPI) 1 %-1:100000 (with pres) injection (1 mL  Given 01/29/16 0903)    Lisa Shaw is 25 y.o. female presenting with Gluteal abscess, I and D performed, patient afebrile and overall very well-appearing, thin tachycardic likely secondary to pain. Will be started on a short course of Bactrim.  Evaluation does not show pathology that would require ongoing emergent intervention or inpatient treatment. Pt is hemodynamically stable and mentating appropriately. Discussed findings and plan with patient/guardian, who agrees with care plan. All questions answered. Return precautions discussed and outpatient follow up given.   New Prescriptions   SULFAMETHOXAZOLE-TRIMETHOPRIM (BACTRIM DS) 800-160 MG TABLET    Take 1 tablet by mouth 2 (two) times daily.         Wynetta Emeryicole Jessee Mezera, PA-C 01/29/16 0930  Lorre NickAnthony Allen, MD 01/31/16 [phone]

## 2016-01-29 NOTE — ED Notes (Signed)
Pt noticed boil at buttocks since Friday.  Started hard and now drainage.  Has had before.  No fever.

## 2016-01-29 NOTE — Discharge Instructions (Signed)
Please follow with your primary care doctor in the next 2 days for a check-up. They must obtain records for further management.  ° °Do not hesitate to return to the Emergency Department for any new, worsening or concerning symptoms.  ° ° °Incision and Drainage °Incision and drainage is a procedure in which a sac-like structure (cystic structure) is opened and drained. The area to be drained usually contains material such as pus, fluid, or blood.  °LET YOUR CAREGIVER KNOW ABOUT:  °· Allergies to medicine. °· Medicines taken, including vitamins, herbs, eyedrops, over-the-counter medicines, and creams. °· Use of steroids (by mouth or creams). °· Previous problems with anesthetics or numbing medicines. °· History of bleeding problems or blood clots. °· Previous surgery. °· Other health problems, including diabetes and kidney problems. °· Possibility of pregnancy, if this applies. °RISKS AND COMPLICATIONS °· Pain. °· Bleeding. °· Scarring. °· Infection. °BEFORE THE PROCEDURE  °You may need to have an ultrasound or other imaging tests to see how large or deep your cystic structure is. Blood tests may also be used to determine if you have an infection or how severe the infection is. You may need to have a tetanus shot. °PROCEDURE  °The affected area is cleaned with a cleaning fluid. The cyst area will then be numbed with a medicine (local anesthetic). A small incision will be made in the cystic structure. A syringe or catheter may be used to drain the contents of the cystic structure, or the contents may be squeezed out. The area will then be flushed with a cleansing solution. After cleansing the area, it is often gently packed with a gauze or another wound dressing. Once it is packed, it will be covered with gauze and tape or some other type of wound dressing.  °AFTER THE PROCEDURE  °· Often, you will be allowed to go home right after the procedure. °· You may be given antibiotic medicine to prevent or heal an  infection. °· If the area was packed with gauze or some other wound dressing, you will likely need to come back in 1 to 2 days to get it removed. °· The area should heal in about 14 days. °  °This information is not intended to replace advice given to you by your health care provider. Make sure you discuss any questions you have with your health care provider. °  °Document Released: 12/25/2000 Document Revised: 12/31/2011 Document Reviewed: 08/26/2011 °Elsevier Interactive Patient Education ©2016 [employer] ° °

## 2016-03-28 ENCOUNTER — Encounter (HOSPITAL_COMMUNITY): Payer: Self-pay | Admitting: Emergency Medicine

## 2016-03-28 ENCOUNTER — Emergency Department (HOSPITAL_COMMUNITY)
Admission: EM | Admit: 2016-03-28 | Discharge: 2016-03-28 | Disposition: A | Discharge status: Home / Self Care | Payer: Self-pay | Attending: Emergency Medicine | Admitting: Emergency Medicine

## 2016-03-28 DIAGNOSIS — F17210 Nicotine dependence, cigarettes, uncomplicated: Secondary | ICD-10-CM | POA: Insufficient documentation

## 2016-03-28 DIAGNOSIS — B9789 Other viral agents as the cause of diseases classified elsewhere: Secondary | ICD-10-CM

## 2016-03-28 DIAGNOSIS — Z79899 Other long term (current) drug therapy: Secondary | ICD-10-CM | POA: Insufficient documentation

## 2016-03-28 DIAGNOSIS — J069 Acute upper respiratory infection, unspecified: Secondary | ICD-10-CM | POA: Insufficient documentation

## 2016-03-28 MED ORDER — METOCLOPRAMIDE HCL 10 MG PO TABS
10.0000 mg | ORAL_TABLET | Freq: Once | ORAL | Status: AC
  Administered 2016-03-28: 10 mg via ORAL
  Filled 2016-03-28: qty 1

## 2016-03-28 MED ORDER — ACETAMINOPHEN 500 MG PO TABS
1000.0000 mg | ORAL_TABLET | Freq: Once | ORAL | Status: AC
  Administered 2016-03-28: 1000 mg via ORAL
  Filled 2016-03-28: qty 2

## 2016-03-28 NOTE — ED Provider Notes (Signed)
[facility]-EMERGENCY DEPT Provider Note   CSN: [id number] Arrival date & time: 03/28/16  [id number]     History   Chief Complaint Chief Complaint  Patient presents with  . Cough  . Weakness    HPI Lisa Shaw is a 25 y.o. female.  The history is provided by the patient.  Cough  This is a new problem. The current episode started yesterday. The problem occurs constantly. The problem has not changed since onset.The cough is non-productive. There has been no fever. Associated symptoms include chills and headaches. She has tried nothing for the symptoms. The treatment provided no relief. She is a smoker (1/2 ppd). Her past medical history does not include asthma.    Past Medical History:  Diagnosis Date  . Epilepsy (HCC)   . Migraine   . Seizures (HCC)     There are no active problems to display for this patient.   History reviewed. No pertinent surgical history.  OB History    No data available       Home Medications    Prior to Admission medications   Medication Sig Start Date End Date Taking? Authorizing Provider  acetaminophen (TYLENOL) 500 MG tablet Take 1,000 mg by mouth every 6 (six) hours as needed for mild pain, moderate pain, fever or headache.     Historical Provider, MD  sulfamethoxazole-trimethoprim (BACTRIM DS) 800-160 MG tablet Take 1 tablet by mouth 2 (two) times daily. 01/29/16   Nicole Pisciotta, PA-C  topiramate (TOPAMAX) 100 MG tablet Take 100 mg by mouth at bedtime.    Historical Provider, MD  topiramate (TOPAMAX) 200 MG tablet Take 1 tablet (200 mg total) by mouth once. Patient not taking: Reported on 01/29/2016 10/21/15   [name] CrumbleAdeleke Oni, MD    Family History Family History  Problem Relation Age of Onset  . Seizures Paternal Uncle     great grandmother    Social History Social History  Substance Use Topics  . Smoking status: Current Every Day Smoker    Packs/day: 0.25    Types: Cigarettes  . Smokeless tobacco: Never Used  . Alcohol use No      Allergies   Review of patient's allergies indicates no known allergies.   Review of Systems Review of Systems  Constitutional: Positive for chills.  Respiratory: Positive for cough.   Neurological: Positive for headaches.  All other systems reviewed and are negative.    Physical Exam Updated Vital Signs BP 126/83 (BP Location: Right Arm)   Pulse (!) 130   Temp 98.4 F (36.9 C) (Oral)   Resp 17   LMP 03/03/2016 (Approximate)   SpO2 100%   Physical Exam  Constitutional: She is oriented to person, place, and time. She appears well-developed and well-nourished. No distress.  HENT:  Head: Normocephalic.  Eyes: Conjunctivae are normal.  Neck: Neck supple. No tracheal deviation present.  Cardiovascular: Normal rate, regular rhythm and normal heart sounds.   Pulmonary/Chest: Effort normal and breath sounds normal. No respiratory distress. She has no wheezes. She has no rales.  Abdominal: Soft. She exhibits no distension.  Neurological: She is alert and oriented to person, place, and time.  Skin: Skin is warm and dry.  Psychiatric: She has a normal mood and affect.  Vitals reviewed.    ED Treatments / Results  Labs (all labs ordered are listed, but only abnormal results are displayed) Labs Reviewed - No data to display  EKG  EKG Interpretation None       Radiology No results  found.  Procedures Procedures (including critical care time)  Medications Ordered in ED Medications - No data to display   Initial Impression / Assessment and Plan / ED Course  I have reviewed the triage vital signs and the nursing notes.  Pertinent labs & imaging results that were available during my care of the patient were reviewed by me and considered in my medical decision making (see chart for details).  Clinical Course    25 y.o. female presents with cough for 1 day with nasal congestion and body aches. No signs of respiratory distress, non-toxic appearing, CTAB, no  concern for pneumonia with this clinical picture. No emergent testing indicated at this time. Pt discharged with likely viral cough which will be self limited in its course. Having chronic migraines which are worsened since rest or symptoms. Given Reglan and Tylenol to help with symptoms. Recommended over-the-counter decongestant for outpatient management. Discussed smoking cessation with patient and was they were offerred resources to help stop.  Total time was 5 min CPT code 16109.     Final Clinical Impressions(s) / ED Diagnoses   Final diagnoses:  Viral URI with cough    New Prescriptions Current Discharge Medication List       Lyndal Pulley, MD 03/28/16 [phone]

## 2016-03-28 NOTE — ED Triage Notes (Addendum)
Pt complaining of cough, congestion and weakness x 2 days. Reports having chills with low grade fever, HA and nausea with one episode of vomiting. Pt also states that she has been taking Topamax for 4 years and is having increased fatigue and dizziness with use.

## 2016-04-08 ENCOUNTER — Ambulatory Visit: Payer: Self-pay | Admitting: Neurology

## 2016-05-14 ENCOUNTER — Emergency Department (HOSPITAL_COMMUNITY)
Admission: EM | Admit: 2016-05-14 | Discharge: 2016-05-14 | Disposition: A | Discharge status: Home / Self Care | Payer: Self-pay | Attending: Emergency Medicine | Admitting: Emergency Medicine

## 2016-05-14 ENCOUNTER — Encounter (HOSPITAL_COMMUNITY): Payer: Self-pay

## 2016-05-14 DIAGNOSIS — L250 Unspecified contact dermatitis due to cosmetics: Secondary | ICD-10-CM | POA: Insufficient documentation

## 2016-05-14 DIAGNOSIS — F17210 Nicotine dependence, cigarettes, uncomplicated: Secondary | ICD-10-CM | POA: Insufficient documentation

## 2016-05-14 MED ORDER — TRIAMCINOLONE ACETONIDE 0.025 % EX OINT: 1.0000 "application " | TOPICAL_OINTMENT | Freq: Two times a day (BID) | CUTANEOUS | 0 refills | Status: DC

## 2016-05-14 MED ORDER — TOPIRAMATE 100 MG PO TABS: 100.0000 mg | ORAL_TABLET | Freq: Every day | ORAL | 0 refills | Status: DC

## 2016-05-14 MED ORDER — LORATADINE 10 MG PO TABS
10.0000 mg | ORAL_TABLET | Freq: Once | ORAL | Status: AC
  Administered 2016-05-14: 10 mg via ORAL
  Filled 2016-05-14: qty 1

## 2016-05-14 NOTE — ED Triage Notes (Signed)
Pt presents with c/o rash on her face. Pt reports she has recently started using a new face wash and has since broke out in a rash on her face with itching and burning to her face as well.

## 2016-05-14 NOTE — ED Provider Notes (Signed)
[facility]-EMERGENCY DEPT Provider Note   CSN: [id number] Arrival date & time: 05/14/16  1216  By signing my name below, I, Majel HomerPeyton Lee, attest that this documentation has been prepared under the direction and in the presence of non-physician practitioner, Arvilla MeresAshley Meyer, PA-C. Electronically Signed: Majel HomerPeyton Lee, Scribe. 05/14/2016. 1:17 PM.  History   Chief Complaint Chief Complaint  Patient presents with  . Rash   The history is provided by the patient. No language interpreter was used.   HPI Comments: Lisa Shaw is a 25 y.o. female with PMHx of epilepsy and seizures, who presents to the Emergency Department for an evaluation of "burning and pruritic" rash on her face that began 3 days ago. Pt reports she recently began using a new face wash, Cuticura, and facial brush 3 days ago and noticed the rash on her face the next morning. She denies using any other new face products, difficulty swallowing, shortness of breath, lesions in her mouth, abdominal pain, dizziness, headache, and vomiting. No treatments tried PTA. Pt also reports she needs a refill for her seizure medication. She notes [facility] but has not gone back due to lack of insurance.   Past Medical History:  Diagnosis Date  . Epilepsy (HCC)   . Migraine   . Seizures (HCC)    There are no active problems to display for this patient.  History reviewed. No pertinent surgical history.  OB History    No data available      Home Medications    Prior to Admission medications   Medication Sig Start Date End Date Taking? Authorizing Provider  acetaminophen (TYLENOL) 500 MG tablet Take 1,000 mg by mouth every 6 (six) hours as needed for mild pain, moderate pain, fever or headache.     Historical Provider, MD  topiramate (TOPAMAX) 100 MG tablet Take 1 tablet (100 mg total) by mouth at bedtime. 05/14/16   [name], PA-C  triamcinolone (KENALOG) 0.025 % ointment Apply 1  application topically 2 (two) times daily. 05/14/16   [name], PA-C    Family History Family History  Problem Relation Age of Onset  . Seizures Paternal Uncle     great grandmother    Social History Social History  Substance Use Topics  . Smoking status: Current Every Day Smoker    Packs/day: 0.25    Types: Cigarettes  . Smokeless tobacco: Never Used  . Alcohol use No   Allergies   Review of patient's allergies indicates no known allergies.  Review of Systems Review of Systems  HENT: Negative for mouth sores and trouble swallowing.   Respiratory: Negative for shortness of breath.   Gastrointestinal: Negative for abdominal pain, nausea and vomiting.  Skin: Positive for rash.  Neurological: Negative for dizziness, light-headedness and headaches.   Physical Exam Updated Vital Signs BP 109/67 (BP Location: Left Arm)   Pulse 84   Temp 97.9 F (36.6 C) (Oral)   Resp 18   Wt 135 lb (61.2 kg)   SpO2 100%   BMI 21.14 kg/m   Physical Exam  Constitutional: She appears well-developed and well-nourished. No distress.  HENT:  Head: Normocephalic and atraumatic.  Mouth/Throat: Uvula is midline, oropharynx is clear and moist and mucous membranes are normal. No trismus in the jaw.  No oral lesions or swelling  Eyes: Conjunctivae are normal. No scleral icterus.  Neck: Normal range of motion and phonation normal. No neck rigidity. Normal range of motion present.  No  nuchal rigidity.   Cardiovascular: Normal rate, regular rhythm, normal heart sounds and intact distal pulses.   No murmur heard. Pulmonary/Chest: Effort normal and breath sounds normal. No stridor. No respiratory distress. She has no wheezes.  Lungs clear bilaterally  Lymphadenopathy:    She has no cervical adenopathy.  Neurological: She is alert.  Skin: Skin is warm and dry. Rash noted. She is not diaphoretic.  Diffuse fine papules on face with no obvious redness or erythema.   Psychiatric: She has a  normal mood and affect. Her behavior is normal.   ED Treatments / Results  Labs (all labs ordered are listed, but only abnormal results are displayed) Labs Reviewed - No data to display  EKG  EKG Interpretation None       Radiology No results found.  Procedures Procedures (including critical care time)  Medications Ordered in ED Medications  loratadine (CLARITIN) tablet 10 mg (10 mg Oral Given 05/14/16 1334)    DIAGNOSTIC STUDIES:  Oxygen Saturation is 100% on RA, normal by my interpretation.    COORDINATION OF CARE:  1:10 PM Discussed treatment plan with pt at bedside and pt agreed to plan.  Initial Impression / Assessment and Plan / ED Course  I have reviewed the triage vital signs and the nursing notes.  Pertinent labs & imaging results that were available during my care of the patient were reviewed by me and considered in my medical decision making (see chart for details).  Clinical Course    Patient presents to ED with complaint of rash x 3 days after recent change to facial soap. Patient is afebrile and non-toxic appearing in NAD. VSS.  Patient with contact dermatitis. No oral lesions or swelling. No trismus. No nuchal rigidity. No stridor. Lungs are clear. Instructed to avoid offending agent and to use unscented soaps, lotions, and detergents. Will treat with topical steroids and PO antihistamine.  No signs of secondary infection. Follow up with PCP in 2-3 days, may need referral to dermatology. Return precautions discussed. Patient voiced understanding and is agreeable with plan. Pt is safe for discharge at this time.  I personally performed the services described in this documentation, which was scribed in my presence. The recorded information has been reviewed and is accurate.   Final Clinical Impressions(s) / ED Diagnoses   Final diagnoses:  Contact dermatitis due to cosmetics, unspecified contact dermatitis type    New Prescriptions Discharge Medication  List as of 05/14/2016  1:24 PM    START taking these medications   Details  triamcinolone (KENALOG) 0.025 % ointment Apply 1 application topically 2 (two) times daily., Starting Tue 05/14/2016, Print         LewistonAshley Laurel Meyer, PA-C 05/14/16 1408    Jacalyn LefevreJulie Haviland, MD 05/14/16 [id number]

## 2016-05-14 NOTE — Discharge Instructions (Signed)
Read the information below.  You may have had a reaction to the new soap you were using. Discontinue the soap. Use a sensitive skin soap to wash face gently. You can take over-the-counter loratadine or cetirizine to help with itching.  I have prescribed a topical steroid cream to apply to your face twice daily for no more than one week.  If symptoms persist please follow up with a primary provider. I have provided contact information for establishing a primary doctor in your discharge paperwork.  I have refilled your seizure medication. It is very important that you follow up with a primary doctor or neurologist for further management.  Use the prescribed medication as directed.  Please discuss all new medications with your pharmacist.   You may return to the Emergency Department at any time for worsening condition or any new symptoms that concern you. Return if you develop trouble breathing, trouble swallowing, facial swelling, or any other new/concerning symptoms.

## 2016-06-27 ENCOUNTER — Encounter (HOSPITAL_COMMUNITY): Payer: Self-pay | Admitting: Family Medicine

## 2016-06-27 ENCOUNTER — Ambulatory Visit (HOSPITAL_COMMUNITY)
Admission: EM | Admit: 2016-06-27 | Discharge: 2016-06-27 | Disposition: A | Discharge status: Home / Self Care | Payer: Self-pay | Attending: Emergency Medicine | Admitting: Emergency Medicine

## 2016-06-27 DIAGNOSIS — R569 Unspecified convulsions: Secondary | ICD-10-CM

## 2016-06-27 DIAGNOSIS — G43101 Migraine with aura, not intractable, with status migrainosus: Secondary | ICD-10-CM

## 2016-06-27 DIAGNOSIS — L700 Acne vulgaris: Secondary | ICD-10-CM

## 2016-06-27 MED ORDER — DOXYCYCLINE HYCLATE 100 MG PO CAPS: 100.0000 mg | ORAL_CAPSULE | Freq: Two times a day (BID) | ORAL | 0 refills | Status: DC

## 2016-06-27 MED ORDER — SUMATRIPTAN SUCCINATE 100 MG PO TABS: 100.0000 mg | ORAL_TABLET | ORAL | 1 refills | Status: DC | PRN

## 2016-06-27 MED ORDER — TOPIRAMATE 100 MG PO TABS: 100.0000 mg | ORAL_TABLET | Freq: Every day | ORAL | 0 refills | Status: DC

## 2016-06-27 NOTE — ED Triage Notes (Signed)
Pt here for left sided headache that started last night. sts that she is almost out of her migraine meds but has been taking every day. sts she takes topamax. sts she had a seizure in April. sts she has migraine headaches.

## 2016-06-27 NOTE — ED Provider Notes (Signed)
CSN: [id number]     Arrival date & time 06/27/16  1000 History   First MD Initiated Contact with Patient 06/27/16 1040     Chief Complaint  Patient presents with  . Headache   (Consider location/radiation/quality/duration/timing/severity/associated sxs/prior Treatment) Patient is having migraine headache.  She c/o aura and left pulsatile headache with scotoma.  She has hx of sz's.  She is out of topamax.  She is having acne problems.     The history is provided by the patient.  Headache  Pain location:  L temporal Quality:  Dull Radiates to:  Does not radiate Severity currently:  5/10 Severity at highest:  9/10 Onset quality:  Sudden Duration:  1 day Timing:  Constant Progression:  Unchanged Chronicity:  New Similar to prior headaches: yes   Relieved by:  Nothing Worsened by:  Nothing Ineffective treatments:  NSAIDs Associated symptoms: blurred vision     Past Medical History:  Diagnosis Date  . Epilepsy (HCC)   . Migraine   . Seizures (HCC)    History reviewed. No pertinent surgical history. Family History  Problem Relation Age of Onset  . Seizures Paternal Uncle     great grandmother   Social History  Substance Use Topics  . Smoking status: Current Every Day Smoker    Packs/day: 0.25    Types: Cigarettes  . Smokeless tobacco: Never Used  . Alcohol use No   OB History    No data available     Review of Systems  Constitutional: Negative.   HENT: Negative.   Eyes: Positive for blurred vision and visual disturbance.  Respiratory: Negative.   Cardiovascular: Negative.   Gastrointestinal: Negative.   Endocrine: Negative.   Genitourinary: Negative.   Musculoskeletal: Negative.   Skin: Positive for color change.       acne  Allergic/Immunologic: Negative.   Neurological: Positive for headaches.  Hematological: Negative.   Psychiatric/Behavioral: Negative.     Allergies  Patient has no known allergies.  Home Medications   Prior to Admission  medications   Medication Sig Start Date End Date Taking? Authorizing Provider  acetaminophen (TYLENOL) 500 MG tablet Take 1,000 mg by mouth every 6 (six) hours as needed for mild pain, moderate pain, fever or headache.     Historical Provider, MD  doxycycline (VIBRAMYCIN) 100 MG capsule Take 1 capsule (100 mg total) by mouth 2 (two) times daily. 06/27/16   Deatra CanterWilliam J Nithya Meriweather, FNP  SUMAtriptan (IMITREX) 100 MG tablet Take 1 tablet (100 mg total) by mouth every 2 (two) hours as needed for migraine. May repeat in 2 hours if headache persists or recurs. 06/27/16   Deatra CanterWilliam J Lawana Hartzell, FNP  topiramate (TOPAMAX) 100 MG tablet Take 1 tablet (100 mg total) by mouth at bedtime. 05/14/16   [name], PA-C  topiramate (TOPAMAX) 100 MG tablet Take 1 tablet (100 mg total) by mouth daily. 06/27/16   Deatra CanterWilliam J Maitri Schnoebelen, FNP  triamcinolone (KENALOG) 0.025 % ointment Apply 1 application topically 2 (two) times daily. 05/14/16   [name], PA-C   Meds Ordered and Administered this Visit  Medications - No data to display  BP 137/83   Pulse 77   Temp 98.1 F (36.7 C)   Resp 18   LMP 06/13/2016   SpO2 100%  No data found.   Physical Exam  Constitutional: She is oriented to person, place, and time. She appears well-developed and well-nourished.  HENT:  Head: Normocephalic and atraumatic.  Right Ear: External ear normal.  Left Ear: External ear normal.  Mouth/Throat: Oropharynx is clear and moist.  Eyes: Conjunctivae and EOM are normal. Pupils are equal, round, and reactive to light.  Neck: Normal range of motion. Neck supple.  Cardiovascular: Normal rate, regular rhythm and normal heart sounds.   Pulmonary/Chest: Effort normal and breath sounds normal.  Neurological: She is alert and oriented to person, place, and time.  Skin:  Open and closed commedone acne on face and forehead.  Nursing note and vitals reviewed.   Urgent Care Course   Clinical Course     Procedures (including  critical care time)  Labs Review Labs Reviewed - No data to display  Imaging Review No results found.   Visual Acuity Review  Right Eye Distance:   Left Eye Distance:   Bilateral Distance:    Right Eye Near:   Left Eye Near:    Bilateral Near:         MDM   1. Migraine with aura and with status migrainosus, not intractable   2. Seizures (HCC)   3. Acne vulgaris    topamax 100mg  po qd #30 Sumatriptan 100mg  one po q 2 hours prn headache max 2 in 24 hours #10 Doxycycline 100mg  one po bid x 14 days #28    Deatra CanterWilliam J Yeraldine Forney, FNP 06/27/16 1102

## 2016-07-18 ENCOUNTER — Encounter (HOSPITAL_COMMUNITY): Payer: Self-pay | Admitting: Emergency Medicine

## 2016-07-18 ENCOUNTER — Emergency Department (HOSPITAL_COMMUNITY)
Admission: EM | Admit: 2016-07-18 | Discharge: 2016-07-18 | Disposition: A | Discharge status: Home / Self Care | Payer: Medicaid Other | Attending: Emergency Medicine | Admitting: Emergency Medicine

## 2016-07-18 DIAGNOSIS — R519 Headache, unspecified: Secondary | ICD-10-CM

## 2016-07-18 DIAGNOSIS — F17210 Nicotine dependence, cigarettes, uncomplicated: Secondary | ICD-10-CM | POA: Insufficient documentation

## 2016-07-18 DIAGNOSIS — R51 Headache: Secondary | ICD-10-CM | POA: Insufficient documentation

## 2016-07-18 DIAGNOSIS — R569 Unspecified convulsions: Secondary | ICD-10-CM

## 2016-07-18 HISTORY — DX: Unspecified convulsions: R56.9

## 2016-07-18 MED ORDER — BUTALBITAL-APAP-CAFFEINE 50-325-40 MG PO TABS
1.0000 | ORAL_TABLET | Freq: Once | ORAL | Status: DC
  Filled 2016-07-18: qty 1

## 2016-07-18 MED ORDER — TOPIRAMATE 100 MG PO TABS: 100.0000 mg | ORAL_TABLET | Freq: Two times a day (BID) | ORAL | 0 refills | Status: DC

## 2016-07-18 MED ORDER — TOPIRAMATE 25 MG PO TABS
200.0000 mg | ORAL_TABLET | Freq: Once | ORAL | Status: DC
  Filled 2016-07-18: qty 8

## 2016-07-18 MED ORDER — TOPIRAMATE 100 MG PO TABS: 100.0000 mg | ORAL_TABLET | Freq: Every day | ORAL | 0 refills | Status: DC

## 2016-07-18 MED ORDER — SUMATRIPTAN SUCCINATE 100 MG PO TABS
100.0000 mg | ORAL_TABLET | Freq: Once | ORAL | Status: AC
  Administered 2016-07-18: 100 mg via ORAL
  Filled 2016-07-18: qty 1

## 2016-07-18 NOTE — ED Notes (Signed)
EPIC timed out after scanning meds for patient, saying incomplete documentation. Pt took meds.

## 2016-07-18 NOTE — ED Provider Notes (Signed)
[facility]-EMERGENCY DEPT Provider Note   CSN: [id number] Arrival date & time: 07/18/16  [id number]     History   Chief Complaint Chief Complaint  Patient presents with  . Seizures    HPI Lisa Shaw is a 26 y.o. female.  26 yo F with seizures.  Hx of same.  Family noted that the patient had some rhythmic shaking while in bed.  Initially post ictal, now back to baseline. Complaining of headache, hx of migraines. Has script for sumatriptan, though hasnt filled it yet.    The history is provided by the patient.  Seizures   This is a new problem. The current episode started less than 1 hour ago. The problem has been resolved. There was 1 seizure. The most recent episode lasted 30 to 120 seconds. Associated symptoms include headaches. Pertinent negatives include no visual disturbance, no chest pain, no nausea and no vomiting. Characteristics include rhythmic jerking. The episode was witnessed. The seizures did not continue in the ED. The seizure(s) had no focality. There has been no fever.    Past Medical History:  Diagnosis Date  . Epilepsy (HCC)   . Migraine   . Seizures (HCC)     There are no active problems to display for this patient.   History reviewed. No pertinent surgical history.  OB History    No data available       Home Medications    Prior to Admission medications   Medication Sig Start Date End Date Taking? Authorizing Provider  acetaminophen (TYLENOL) 500 MG tablet Take 1,000 mg by mouth every 6 (six) hours as needed for mild pain, moderate pain, fever or headache.     Historical Provider, MD  doxycycline (VIBRAMYCIN) 100 MG capsule Take 1 capsule (100 mg total) by mouth 2 (two) times daily. 06/27/16   [name], FNP  SUMAtriptan (IMITREX) 100 MG tablet Take 1 tablet (100 mg total) by mouth every 2 (two) hours as needed for migraine. May repeat in 2 hours if headache persists or recurs. 06/27/16   [name], FNP  topiramate (TOPAMAX) 100 MG  tablet Take 1 tablet (100 mg total) by mouth daily. 07/18/16   [name], DO  triamcinolone (KENALOG) 0.025 % ointment Apply 1 application topically 2 (two) times daily. 05/14/16   [name], PA-C    Family History Family History  Problem Relation Age of Onset  . Seizures Paternal Uncle     great grandmother    Social History Social History  Substance Use Topics  . Smoking status: Current Every Day Smoker    Packs/day: 0.25    Types: Cigarettes  . Smokeless tobacco: Never Used  . Alcohol use No     Allergies   Patient has no known allergies.   Review of Systems Review of Systems  Constitutional: Negative for chills and fever.  HENT: Negative for congestion and rhinorrhea.   Eyes: Negative for redness and visual disturbance.  Respiratory: Negative for shortness of breath and wheezing.   Cardiovascular: Negative for chest pain and palpitations.  Gastrointestinal: Negative for nausea and vomiting.  Genitourinary: Negative for dysuria and urgency.  Musculoskeletal: Negative for arthralgias and myalgias.  Skin: Negative for pallor and wound.  Neurological: Positive for seizures and headaches. Negative for dizziness.     Physical Exam Updated Vital Signs BP 112/63 (BP Location: Left Arm)   Pulse 90   Temp 98.9 F (37.2 C) (Oral)   Resp 22   Ht 5\' 6"  (1.676 m)  Wt 135 lb (61.2 kg)   LMP 06/13/2016   SpO2 99%   BMI 21.79 kg/m   Physical Exam  Constitutional: She is oriented to person, place, and time. She appears well-developed and well-nourished. No distress.  HENT:  Head: Normocephalic and atraumatic.  Eyes: EOM are normal. Pupils are equal, round, and reactive to light.  Neck: Normal range of motion. Neck supple.  Cardiovascular: Normal rate and regular rhythm.  Exam reveals no gallop and no friction rub.   No murmur heard. Pulmonary/Chest: Effort normal. She has no wheezes. She has no rales.  Abdominal: Soft. She exhibits no distension. There is no  tenderness.  Musculoskeletal: She exhibits no edema or tenderness.  Neurological: She is alert and oriented to person, place, and time.  Skin: Skin is warm and dry. She is not diaphoretic.  Psychiatric: She has a normal mood and affect. Her behavior is normal.  Nursing note and vitals reviewed.    ED Treatments / Results  Labs (all labs ordered are listed, but only abnormal results are displayed) Labs Reviewed - No data to display  EKG  EKG Interpretation None       Radiology No results found.  Procedures Procedures (including critical care time)  Medications Ordered in ED Medications  topiramate (TOPAMAX) tablet 200 mg (not administered)  butalbital-acetaminophen-caffeine (FIORICET, ESGIC) 50-325-40 MG per tablet 1 tablet (not administered)  SUMAtriptan (IMITREX) tablet 100 mg (not administered)     Initial Impression / Assessment and Plan / ED Course  I have reviewed the triage vital signs and the nursing notes.  Pertinent labs & imaging results that were available during my care of the patient were reviewed by me and considered in my medical decision making (see chart for details).  Clinical Course     26 yo F with headache and seizure.  Hx of same. Well appearing not toxic, will give home meds.  Follow up with neuro.   7:23 AM:  I have discussed the diagnosis/risks/treatment options with the patient and family and believe the pt to be eligible for discharge home to follow-up with PCP. We also discussed returning to the ED immediately if new or worsening sx occur. We discussed the sx which are most concerning (e.g., sudden worsening pain, fever, inability to tolerate by mouth) that necessitate immediate return. Medications administered to the patient during their visit and any new prescriptions provided to the patient are listed below.  Medications given during this visit Medications  topiramate (TOPAMAX) tablet 200 mg (not administered)    butalbital-acetaminophen-caffeine (FIORICET, ESGIC) 50-325-40 MG per tablet 1 tablet (not administered)  SUMAtriptan (IMITREX) tablet 100 mg (not administered)     The patient appears reasonably screen and/or stabilized for discharge and I doubt any other medical condition or other [facility] requiring further screening, evaluation, or treatment in the ED at this time prior to discharge.    Final Clinical Impressions(s) / ED Diagnoses   Final diagnoses:  Seizure (HCC)  Nonintractable headache, unspecified chronicity pattern, unspecified headache type    New Prescriptions Current Discharge Medication List       Melene Plan, DO 07/18/16 1610

## 2016-07-18 NOTE — ED Notes (Signed)
Padded the bed with blankets on siderails.

## 2016-07-18 NOTE — ED Triage Notes (Signed)
BIB EMS from home, pt has witnessed seizure from family while pt was laying in bed. Pt was post ictal upon EMS arrival, pt now A/OX4. Hx seizures, did not take medicine last night b/c about to run out of pills. VSS.

## 2016-08-12 ENCOUNTER — Encounter: Payer: Self-pay | Admitting: *Deleted

## 2016-08-13 ENCOUNTER — Ambulatory Visit (INDEPENDENT_AMBULATORY_CARE_PROVIDER_SITE_OTHER): Payer: Self-pay | Admitting: Diagnostic Neuroimaging

## 2016-08-13 ENCOUNTER — Encounter: Payer: Self-pay | Admitting: Diagnostic Neuroimaging

## 2016-08-13 VITALS — BP 130/80 | HR 106 | Ht 66.5 in | Wt 143.4 lb

## 2016-08-13 DIAGNOSIS — G40909 Epilepsy, unspecified, not intractable, without status epilepticus: Secondary | ICD-10-CM

## 2016-08-13 DIAGNOSIS — G43009 Migraine without aura, not intractable, without status migrainosus: Secondary | ICD-10-CM

## 2016-08-13 MED ORDER — LEVETIRACETAM 250 MG PO TABS: 250.0000 mg | ORAL_TABLET | Freq: Two times a day (BID) | ORAL | 12 refills | Status: DC

## 2016-08-13 MED ORDER — TOPIRAMATE 100 MG PO TABS: 100.0000 mg | ORAL_TABLET | Freq: Two times a day (BID) | ORAL | 12 refills | Status: DC

## 2016-08-13 MED ORDER — SUMATRIPTAN SUCCINATE 100 MG PO TABS: 100.0000 mg | ORAL_TABLET | ORAL | 1 refills | Status: DC | PRN

## 2016-08-13 MED ORDER — LACOSAMIDE 50 MG PO TABS: 50.0000 mg | ORAL_TABLET | Freq: Two times a day (BID) | ORAL | 5 refills | Status: DC

## 2016-08-13 NOTE — Patient Instructions (Addendum)
Thank you for coming to see [name] at [facility]. I hope we have been able to provide you high quality care today.  You may receive a patient satisfaction survey over the next few weeks. We would appreciate your feedback and comments so that we may continue to improve ourselves and the health of our patients.  - continue topiramate 136m twice a day  - add levetiracetam [id number] a day (generic; short term until you can get vimpat)  - add vimpat [id number] a day (will give patient assistance forms)  - in future may consider lamotrigine or depakote   - According to [place] law, you can not drive unless you are seizure free for at least 6 months and under physician's care.   - Please maintain seizure precautions. Do not participate in activities where a loss of awareness could harm you or someone else. No swimming alone, no tub bathing, no hot tubs, no driving, no operating motorized vehicles (cars, ATVs, motocycles, etc), lawnmowers or power tools. No standing at heights, such as rooftops, ladders or stairs. Avoid hot objects such as stoves, heaters, open fires. Wear a helmet when riding a bicycle, scooter, skateboard, etc. and avoid areas of traffic. Set your water heater to 120 degrees or less.    ~~~~~~~~~~~~~~~~~~~~~~~~~~~~~~~~~~~~~~~~~~~~~~~~~~~~~~~~~~~~~~~~~  DR. Kashae Carstens'S GUIDE TO HAPPY AND HEALTHY LIVING These are some of my general health and wellness recommendations. Some of them may apply to you better than others. Please use common sense as you try these suggestions and feel free to ask me any questions.   ACTIVITY/FITNESS Mental, social, emotional and physical stimulation are very important for brain and body health. Try learning a new activity (arts, music, language, sports, games).  Keep moving your body to the best of your abilities. You can do this at home, inside or outside, the park, community center, gym or anywhere you like. Consider a physical therapist  or personal trainer to get started. Consider the app Sworkit. Fitness trackers such as smart-watches, smart-phones or Fitbits can help as well.   NUTRITION Eat more plants: colorful vegetables, nuts, seeds and berries.  Eat less sugar, salt, preservatives and processed foods.  Avoid toxins such as cigarettes and alcohol.  Drink water when you are thirsty. Warm water with a slice of lemon is an excellent morning drink to start the day.  Consider these websites for more information The Nutrition Source (htthttps://www.henry-hernandez.biz/recision Nutrition (wwwWindowBlog.ch RELAXATION Consider practicing mindfulness meditation or other relaxation techniques such as deep breathing, prayer, yoga, tai chi, massage. See website mindful.org or the apps Headspace or Calm to help get started.   SLEEP Try to get at least 7-8+ hours sleep per day. Regular exercise and reduced caffeine will help you sleep better. Practice good sleep hygeine techniques. See website sleep.org for more information.   PLANNING Prepare estate planning, living will, healthcare POA documents. Sometimes this is best planned with the help of an attorney. Theconversationproject.org and agingwithdignity.org are excellent resources.

## 2016-08-13 NOTE — Progress Notes (Signed)
[facility]  PATIENT: Lisa Shaw DOB: 16-Feb-1991  REFERRING CLINICIAN: Lowella Bandy HISTORY FROM: patient and mother  REASON FOR VISIT: new consult    HISTORICAL  CHIEF COMPLAINT:  Chief Complaint  Patient presents with  . Seizures    rm 7, New Pt, mother- Lucendia Herrlich, "seizures x 5 years, most recent 07/18/16; was on Keppra but it caused headaches, has seen neurologist at [facility] in past""    HISTORY OF PRESENT ILLNESS:   NEW HPI (08/13/16, VRP): 26 year old female here for evaluation of seizures. Patient had first onset of seizure in 2013. I saw patient in 2014 (see note below). Apparently she was having intermittent staring spells for a few months and then had a generalized convulsive seizure. Initially she was started on Keppra but this caused mood and agitation problems. At some point this was switched to topiramate. Patient continues to have generalized convulsive seizures, approximate 5 times per year. Convulsive seizures associated with tongue biting and incontinence. Also having intermittent staring spells twice per day. Patient has been followed recently at [facility] epilepsy clinic. Patient also has intermittent headaches, bilateral sharp sensations without associated symptoms.  PRIOR HPI (09/09/14, Amedeo Kinsman, MD; ZOX): "Seizures: Dec 2013 was first one. She has had a total of 5-6. Last was May 09, 2013. Referred from [facility] for evaluation of spells concerning for seizures. Seizures are described as tonic posturing in the UE and LE with rthymic diffuse jerking with eye rolling backwards. Left frontal sharps on EEG from 06/2012. Prior to this in 2012, she started having starring episodes (lasting ~2 min) associated with diaphoresis with post-ictal confusion with violence. These only occur at night or early in the morning. There was evidence of tongue biting. No shaking or jerks noted interictally. During the episode the patient was responsive/not  responsive. The patient has had urinary incontinence. No ETOH use. She now gets about 6-7 hours of sleep a night (previously while working she got 4-5 hours. No increased levels of stress. No illegal drug use. Has issues with deja vu, and possibly tingling while she has the staring episodes or before the large ones. The patient is currently on Keppra 1000 bid (increased Sept 2014) and has had irritation and grumpiness. No history of TBIs/meningitis, encephalitis/personal history of MR (completed 12 th grade on time), febrile seizures. The patient has had an MRI of her brain (reviewed report and this showed motion artifact but no large area of abnormality). The patient has had an EEG (showing left frontal sharps).The patient does not drive. She had been unable to keep a job 2/2 seizures and medication side effects. Per patient, no history of psychiatric issues. Family history of seizures in maternal grandmother (all her life). On Keppra, violent with mood disturbances so this was discontinued in 2015 and she was started on Topamax which was initially not tolerated 2/2 mental slowing but she now tolerates this and is on Topamax 100 mg qhs. Headaches: Has had headaches since 2012. Headaches b/l, frontal, and sharp. Has to take Advil or BC. Has these 50-70% of month. Not related to menses. Not associated with weather changes or foods or deprivation. Has diplopia with the headaches. Topamax caused medication side effects of mental slowing even at low doses which did not improve with time."  PRIOR HPI (09/23/12, VRP): 26 year old right-handed female here for evaluation of seizure. 06/19/2012, patient was asleep, when all of a sudden she had convulsions, tongue biting and incontinence. Convulsions lasted for 2 minutes and were witnessed  by a friend. Paramedics were called to the home. Patient was confused and combative after the seizure. She is amnestic to events before and after the seizure. Patient went to the hospital,  had MRI, EEG, which showed some left frontal output from activity, and was discharged on levetiracetam. Patient ran out of her medications in early January 2014. 07/20/2012, patient had a second seizure, at night while asleep. This was unwitnessed the patient woke up feeling funny and with urinary incontinence. Patient went to the hospital she was given a prescription for antiseizure medication. Since that time patient had no further seizures. She has noted some mood irritability, dizziness and insomnia since starting levetiracetam. Patient was born full-term. Patient's mother was hospitalized last month of pregnancy due to preterm labor. Patient developed normally. No history of head trauma, meningitis or encephalitis. Only family history seizure is on patient's great grandmother on mother's side.    REVIEW OF SYSTEMS: Full 14 system review of systems performed and negative with exception of: Memory loss confusion headache numbness weakness sleepiness shiftwork seizure fevers chills fatigue feeling hot feeling cold aching muscles depression anxiety racing thoughts not asleep change in appetite.  ALLERGIES: Allergies  Allergen Reactions  . Keppra [Levetiracetam] Other (See Comments)    "Headaches"    HOME MEDICATIONS: Outpatient Medications Prior to Visit  Medication Sig Dispense Refill  . acetaminophen (TYLENOL) 500 MG tablet Take 1,000 mg by mouth every 6 (six) hours as needed for mild pain, moderate pain, fever or headache.     . topiramate (TOPAMAX) 100 MG tablet Take 1 tablet (100 mg total) by mouth 2 (two) times daily. 60 tablet 0  . SUMAtriptan (IMITREX) 100 MG tablet Take 1 tablet (100 mg total) by mouth every 2 (two) hours as needed for migraine. May repeat in 2 hours if headache persists or recurs. (Patient not taking: Reported on 08/13/2016) 10 tablet 1  . doxycycline (VIBRAMYCIN) 100 MG capsule Take 1 capsule (100 mg total) by mouth 2 (two) times daily. 28 capsule 0  . triamcinolone  (KENALOG) 0.025 % ointment Apply 1 application topically 2 (two) times daily. 30 g 0   No facility-administered medications prior to visit.     PAST MEDICAL HISTORY: Past Medical History:  Diagnosis Date  . Epilepsy (HCC)   . Migraine   . Seizures (HCC) 07/18/2016    PAST SURGICAL HISTORY: History reviewed. No pertinent surgical history.  FAMILY HISTORY: Family History  Problem Relation Age of Onset  . Seizures Other     SOCIAL HISTORY:  Social History   Social History  . Marital status: Single    Spouse name: N/A  . Number of children: 0  . Years of education: 12th   Occupational History  .      cleaning for [employer]   Social History Main Topics  . Smoking status: Current Every Day Smoker    Types: Cigarettes  . Smokeless tobacco: Never Used     Comment: 1/30 avg 2/day  . Alcohol use No  . Drug use: Yes    Types: Marijuana     Comment: 1/30 avg 1-2 x daily  . Sexual activity: Yes   Other Topics Concern  . Not on file   Social History Narrative   Patient lives at home with her mother.   Caffeine Use: sodas daily     PHYSICAL EXAM  GENERAL EXAM/CONSTITUTIONAL: Vitals:  Vitals:   08/13/16 0837  BP: 130/80  Pulse: (!) 106  Weight: 143 lb 6.4  oz (65 kg)  Height: 5' 6.5" (1.689 m)     Body mass index is 22.8 kg/m.  Visual Acuity Screening   Right eye Left eye Both eyes  Without correction: 20/30 20/30   With correction:        Patient is in no distress; well developed, nourished and groomed; neck is supple  CARDIOVASCULAR:  Examination of carotid arteries is normal; no carotid bruits  Regular rate and rhythm, no murmurs  Examination of peripheral vascular system by observation and palpation is normal  EYES:  Ophthalmoscopic exam of optic discs and posterior segments is normal; no papilledema or hemorrhages  MUSCULOSKELETAL:  Gait, strength, tone, movements noted in Neurologic exam below  NEUROLOGIC: MENTAL STATUS:  No  flowsheet data found.  awake, alert, oriented to person, place and time  recent and remote memory intact  normal attention and concentration  language fluent, comprehension intact, naming intact,   fund of knowledge appropriate  CRANIAL NERVE:   2nd - no papilledema on fundoscopic exam  2nd, 3rd, 4th, 6th - pupils equal and reactive to light, visual fields full to confrontation, extraocular muscles intact, no nystagmus  5th - facial sensation symmetric  7th - facial strength symmetric  8th - hearing intact  9th - palate elevates symmetrically, uvula midline  11th - shoulder shrug symmetric  12th - tongue protrusion midline  MOTOR:   normal bulk and tone, full strength in the BUE, BLE  SENSORY:   normal and symmetric to light touch, temperature, vibration  COORDINATION:   finger-nose-finger, fine finger movements normal  REFLEXES:   deep tendon reflexes present and symmetric  GAIT/STATION:   narrow based gait    DIAGNOSTIC DATA (LABS, IMAGING, TESTING) - I reviewed patient records, labs, notes, testing and imaging myself where available.  Lab Results  Component Value Date   WBC 8.7 01/25/2014   HGB 13.6 01/25/2014   HCT 40.5 01/25/2014   MCV 96.7 01/25/2014   PLT 315 01/25/2014      Component Value Date/Time   NA 143 01/25/2014 1651   K 3.4 (L) 01/25/2014 1651   CL 107 01/25/2014 1651   CO2 18 (L) 01/25/2014 1651   GLUCOSE 105 (H) 01/25/2014 1651   BUN 11 01/25/2014 1651   CREATININE 1.38 (H) 01/25/2014 1651   CALCIUM 10.3 01/25/2014 1651   PROT 8.0 01/25/2014 1651   ALBUMIN 4.6 01/25/2014 1651   AST 24 01/25/2014 1651   ALT 19 01/25/2014 1651   ALKPHOS 94 01/25/2014 1651   BILITOT 0.4 01/25/2014 1651   GFRNONAA 54 (L) 01/25/2014 1651   GFRAA 62 (L) 01/25/2014 1651   No results found for: CHOL, HDL, LDLCALC, LDLDIRECT, TRIG, CHOLHDL No results found for: ZOXW9U No results found for: VITAMINB12 No results found for: TSH   06/19/12  MRI brain [I reviewed images myself and agree with interpretation. -VRP]  - Images are degraded by motion.  Allowing for this no mass or infarct is identified.  Lateral ventricles are mildly prominent but may be within normal limits.  06/19/12 EEG  - This is an abnormal EEG secondary to focal sharp wave activity in the frontal region on the left.  This finding is suggestive of a focal disturbance with epileptogenic potential.      ASSESSMENT AND PLAN  26 y.o. year old female here with seizure disorder since 2013, with abnormal EEG showing left frontal sharp wave activity. Also with intermittent headaches. Also with limited financial and health insurance resources.   Dx:  1. Seizure disorder (HCC)   2. Migraine without aura and without status migrainosus, not intractable      PLAN: - continue topiramate 100mg  twice a day - add levetiracetam 250mg  twice a day (generic; short term until she can get vimpat) - add vimpat 50mg  twice a day (will give patient assistance forms) - in future may consider lamotrigine, depakote or tegretol - encouraged patient to apply for financial assistance through [facility]  Meds ordered this encounter  Medications  . topiramate (TOPAMAX) 100 MG tablet    Sig: Take 1 tablet (100 mg total) by mouth 2 (two) times daily.    Dispense:  60 tablet    Refill:  12  . SUMAtriptan (IMITREX) 100 MG tablet    Sig: Take 1 tablet (100 mg total) by mouth every 2 (two) hours as needed for migraine. May repeat in 2 hours if headache persists or recurs.    Dispense:  10 tablet    Refill:  1  . levETIRAcetam (KEPPRA) 250 MG tablet    Sig: Take 1 tablet (250 mg total) by mouth 2 (two) times daily.    Dispense:  60 tablet    Refill:  12  . lacosamide (VIMPAT) 50 MG TABS tablet    Sig: Take 1 tablet (50 mg total) by mouth 2 (two) times daily.    Dispense:  60 tablet    Refill:  5   Return in about 6 weeks (around 09/24/2016).  I reviewed images, labs, notes,  records myself. I summarized findings and reviewed with patient, for this high risk condition (recurrent seizures) requiring high complexity decision making.    Suanne Marker, MD 08/13/2016, 8:54 AM Certified in Neurology, Neurophysiology and Neuroimaging  [facility] [address] [place], [place] [address] [phone]

## 2016-08-22 ENCOUNTER — Telehealth: Payer: Self-pay | Admitting: *Deleted

## 2016-08-22 ENCOUNTER — Telehealth: Payer: Self-pay | Admitting: Diagnostic Neuroimaging

## 2016-08-22 MED ORDER — LACOSAMIDE 50 MG PO TABS: 50.0000 mg | ORAL_TABLET | Freq: Two times a day (BID) | ORAL | 5 refills | Status: DC

## 2016-08-22 NOTE — Telephone Encounter (Signed)
I have Paper worked for all Patient assistance. I have called and left message for patient and we can help her with Vimpat/ Keppra .  1. I need her proof of income to start process relayed process take 7-10 business days.

## 2016-08-22 NOTE — Telephone Encounter (Signed)
Spoke with patient for more clarification on previous note. She stated the PAP papers are Vimpat patient assistance papers to give her assistance in buying the medication. She stated she filled out her portion and needs Dr Marjory LiesPenumalli to complete his portion. She then stated that she also needs a specific seizure diagnosis, not just that she has a seizure disorder for another set of assistance papers she is filling out.  Informed her this RN has not seen the Vimpat papers and advised her Dr Marjory LiesPenumalli will be in the office this afternoon, will discuss with him. Will also route note to Darreld Mcleanana Cox, who assists patient's with Vimpat.  Will call her back this afternoon. She verbalized understanding, appreciation.

## 2016-08-22 NOTE — Telephone Encounter (Signed)
I have spoke to Patient she will bring me proof of income on Monday and sign paper work .  08/26/2016 . Patient will only be getting assistance for Vimpat . Thanks Annabelle Harmanana .

## 2016-08-22 NOTE — Telephone Encounter (Signed)
Spoke with patient to clarify who is asking for a specific seizure diagnosis. She stated the Vimpat pt assistance papers need diagnosis. She also stated she has a meeting with her employer next week to discuss her "medical condition".  Advised her that Darreld McleanDana Cox has started the Vimpat papers which she will review with her on Monday and have her to sign. Requested she bring in the Vimpat papers she has and they can be shredded. Advised her that she needs to have her employer contact Dr Marjory LiesPenumalli directly for information or give her papers that they require filled out for her job. She verbalized understanding, appreciation.   She then stated she was unsure how to take her topiramate and keppra. Reviewed with her twice Dr Richrd HumblesPenumalli;s plan for those two medications per his office note from her last visit. She repeated his directions correctly and verbalized understanding again.

## 2016-08-22 NOTE — Telephone Encounter (Signed)
Pt says her mother dropped off PAP for last week. She is wanting to know if they are ready for her pick up. She is also wanting a printed documentation as to what type of seizures she has to take with her also.

## 2016-08-22 NOTE — Telephone Encounter (Signed)
[facility] Kathlene NovemberMike[facility] pt picked up prescriptions from Dr Marjory LiesPenumalli. Pharmacist stated he did not have Vimpat Rx on file. Will reprint Rx for Vimpat in order for completion of Vimpat patient assistance to be done. Completed Rx and paper work will be submitted by Darreld Mcleanana Cox after receiving patient's required financial information.

## 2016-09-03 NOTE — Telephone Encounter (Signed)
Called Patient and left her another message I still need her proof of income to be able to process patient assistance.

## 2016-10-14 ENCOUNTER — Telehealth: Payer: Self-pay | Admitting: Diagnostic Neuroimaging

## 2016-10-14 NOTE — Telephone Encounter (Signed)
Patient is wanting to find out the status of her vinpat/keppra, please call pt [phone]

## 2016-10-15 NOTE — Telephone Encounter (Signed)
Spoke to pt.  She is anticipating that her medicaid card will come first part of April.  Made appt for 12-03-16 at 1000 to accommodate if delayed.   She was ok with this.

## 2016-10-15 NOTE — Telephone Encounter (Signed)
Patient assistance Vimpat was approved. RX will be shipped to patient's Home.   Patient has apt  10/18/2016 . Patient will not be able to make apt because her medicaid is still not approved and she can't afford to Pay $200.00 .   Dr. Marjory Lies first office visit is July. Patient relayed she did not want to wait that long. Wylie Hail Patient will need apt sometime in May. Thanks Annabelle Harman

## 2016-10-18 ENCOUNTER — Ambulatory Visit: Payer: Medicaid Other | Admitting: Diagnostic Neuroimaging

## 2016-10-21 ENCOUNTER — Encounter (HOSPITAL_COMMUNITY): Payer: Self-pay | Admitting: Emergency Medicine

## 2016-10-21 ENCOUNTER — Emergency Department (HOSPITAL_COMMUNITY)
Admission: EM | Admit: 2016-10-21 | Discharge: 2016-10-21 | Disposition: A | Discharge status: Home / Self Care | Payer: Self-pay | Attending: Emergency Medicine | Admitting: Emergency Medicine

## 2016-10-21 ENCOUNTER — Emergency Department (HOSPITAL_COMMUNITY): Payer: Self-pay

## 2016-10-21 DIAGNOSIS — F17210 Nicotine dependence, cigarettes, uncomplicated: Secondary | ICD-10-CM | POA: Insufficient documentation

## 2016-10-21 DIAGNOSIS — Z79899 Other long term (current) drug therapy: Secondary | ICD-10-CM | POA: Insufficient documentation

## 2016-10-21 DIAGNOSIS — N83291 Other ovarian cyst, right side: Secondary | ICD-10-CM | POA: Insufficient documentation

## 2016-10-21 DIAGNOSIS — N83202 Unspecified ovarian cyst, left side: Secondary | ICD-10-CM

## 2016-10-21 DIAGNOSIS — N83201 Unspecified ovarian cyst, right side: Secondary | ICD-10-CM

## 2016-10-21 DIAGNOSIS — N83292 Other ovarian cyst, left side: Secondary | ICD-10-CM | POA: Insufficient documentation

## 2016-10-21 DIAGNOSIS — N83209 Unspecified ovarian cyst, unspecified side: Secondary | ICD-10-CM

## 2016-10-21 LAB — WET PREP, GENITAL
Sperm: NONE SEEN
Trich, Wet Prep: NONE SEEN
Yeast Wet Prep HPF POC: NONE SEEN

## 2016-10-21 LAB — COMPREHENSIVE METABOLIC PANEL
ALBUMIN: 4.7 g/dL (ref 3.5–5.0)
ALT: 13 U/L — ABNORMAL LOW (ref 14–54)
ANION GAP: 8 (ref 5–15)
AST: 19 U/L (ref 15–41)
Alkaline Phosphatase: 76 U/L (ref 38–126)
BILIRUBIN TOTAL: 0.6 mg/dL (ref 0.3–1.2)
BUN: 10 mg/dL (ref 6–20)
CO2: 19 mmol/L — ABNORMAL LOW (ref 22–32)
Calcium: 9.5 mg/dL (ref 8.9–10.3)
Chloride: 116 mmol/L — ABNORMAL HIGH (ref 101–111)
Creatinine, Ser: 0.76 mg/dL (ref 0.44–1.00)
GFR calc Af Amer: >60 mL/min (ref >60)
GFR calc non Af Amer: >60 mL/min (ref >60)
GLUCOSE: 89 mg/dL (ref 65–99)
POTASSIUM: 3.3 mmol/L — AB (ref 3.5–5.1)
Sodium: 143 mmol/L (ref 135–145)
TOTAL PROTEIN: 7.3 g/dL (ref 6.5–8.1)

## 2016-10-21 LAB — I-STAT BETA HCG BLOOD, ED (MC, WL, AP ONLY)
I-stat hCG, quantitative: <5 m[IU]/mL (ref <5)
I-stat hCG, quantitative: <5 m[IU]/mL (ref <5)

## 2016-10-21 LAB — URINALYSIS, ROUTINE W REFLEX MICROSCOPIC
BILIRUBIN URINE: NEGATIVE
GLUCOSE, UA: NEGATIVE mg/dL
HGB URINE DIPSTICK: NEGATIVE
Ketones, ur: 5 mg/dL — AB
Leukocytes, UA: NEGATIVE
NITRITE: NEGATIVE
PH: 7 (ref 5.0–8.0)
Protein, ur: NEGATIVE mg/dL
SPECIFIC GRAVITY, URINE: 1.016 (ref 1.005–1.030)

## 2016-10-21 LAB — CBC
HEMATOCRIT: 37.7 % (ref 36.0–46.0)
HEMOGLOBIN: 12.8 g/dL (ref 12.0–15.0)
MCH: 32.8 pg (ref 26.0–34.0)
MCHC: 34 g/dL (ref 30.0–36.0)
MCV: 96.7 fL (ref 78.0–100.0)
Platelets: 271 10*3/uL (ref 150–400)
RBC: 3.9 MIL/uL (ref 3.87–5.11)
RDW: 12.8 % (ref 11.5–15.5)
WBC: 5.6 10*3/uL (ref 4.0–10.5)

## 2016-10-21 LAB — LIPASE, BLOOD: Lipase: 18 U/L (ref 11–51)

## 2016-10-21 MED ORDER — KETOROLAC TROMETHAMINE 30 MG/ML IJ SOLN
30.0000 mg | Freq: Once | INTRAMUSCULAR | Status: AC
  Administered 2016-10-21: 30 mg via INTRAVENOUS
  Filled 2016-10-21: qty 1

## 2016-10-21 MED ORDER — POTASSIUM CHLORIDE CRYS ER 20 MEQ PO TBCR
40.0000 meq | EXTENDED_RELEASE_TABLET | Freq: Once | ORAL | Status: AC
  Administered 2016-10-21: 40 meq via ORAL
  Filled 2016-10-21: qty 2

## 2016-10-21 MED ORDER — IOPAMIDOL (ISOVUE-300) INJECTION 61%
100.0000 mL | Freq: Once | INTRAVENOUS | Status: AC | PRN
  Administered 2016-10-21: 100 mL via INTRAVENOUS

## 2016-10-21 MED ORDER — IOPAMIDOL (ISOVUE-300) INJECTION 61%
INTRAVENOUS | Status: AC
  Filled 2016-10-21: qty 100

## 2016-10-21 MED ORDER — ACETAMINOPHEN 500 MG PO TABS: 500.0000 mg | ORAL_TABLET | Freq: Four times a day (QID) | ORAL | 0 refills | Status: DC | PRN

## 2016-10-21 MED ORDER — IBUPROFEN 800 MG PO TABS: 800.0000 mg | ORAL_TABLET | Freq: Three times a day (TID) | ORAL | 0 refills | Status: DC

## 2016-10-21 MED ORDER — ONDANSETRON HCL 4 MG/2ML IJ SOLN
4.0000 mg | Freq: Once | INTRAMUSCULAR | Status: AC
  Administered 2016-10-21: 4 mg via INTRAVENOUS
  Filled 2016-10-21: qty 2

## 2016-10-21 MED ORDER — SODIUM CHLORIDE 0.9 % IV BOLUS (SEPSIS)
1000.0000 mL | Freq: Once | INTRAVENOUS | Status: AC
  Administered 2016-10-21: 1000 mL via INTRAVENOUS

## 2016-10-21 MED ORDER — ACETAMINOPHEN 325 MG PO TABS
650.0000 mg | ORAL_TABLET | Freq: Once | ORAL | Status: DC
  Filled 2016-10-21: qty 2

## 2016-10-21 MED ORDER — MORPHINE SULFATE (PF) 2 MG/ML IV SOLN
4.0000 mg | Freq: Once | INTRAVENOUS | Status: AC
  Administered 2016-10-21: 4 mg via INTRAVENOUS
  Filled 2016-10-21: qty 2

## 2016-10-21 NOTE — ED Notes (Signed)
Pt transported to CT ?

## 2016-10-21 NOTE — ED Notes (Signed)
PA notified that pt request something for headache.

## 2016-10-21 NOTE — ED Notes (Signed)
Per [facility] point nurse main lab called and en route to draw labs.

## 2016-10-21 NOTE — ED Notes (Signed)
Per [place] medicate and continue to discharge.

## 2016-10-21 NOTE — ED Notes (Signed)
Kerman Passey unsuccessful IV attempt to [facility]; one attempt made related to unable to visualize another site.

## 2016-10-21 NOTE — ED Provider Notes (Signed)
Shift hand-off  Discharge care assumed from [facility], PA-C pending wet prep. Pt w ovarian cystic change and probable ruptured ovarian cyst. Per Buel Ream, PA-C, clinical exam not consistent w BV. Plan of no treatment for BV if clue cells seen on wet prep. Remainder of discharge paperwork prepared by Conni Elliot, PA-C. Wet prep shows clue cells present w few WBC, no yeast or trich. I did not perform a physical exam on this patient. Pt reluctant to leave and requested admission. Reviewed her workup today and provided reassurance. Pt seen by Dr. Ranae Palms as well and cleared for discharge.     [place] N Russo, PA-C 10/21/16 2220    Mancel Bale, MD 10/22/16 [phone]

## 2016-10-21 NOTE — ED Provider Notes (Signed)
[facility]-EMERGENCY DEPT Provider Note   CSN: [id number] Arrival date & time: 10/21/16  1004     History   Chief Complaint Chief Complaint  Patient presents with  . Abdominal Pain    HPI Lisa Shaw is a 26 y.o. female with history of epilepsy and migraines who presents with abdominal cramping, diarrhea, and nausea. Patient states she began having abdominal cramping yesterday evening draining corn on the cob at home. She had nausea at that time as well. She again having nonbloody, watery diarrhea today. She had one episode. Patient reports associated "shakiness." She denies any fevers, but states she does have chills. Patient describes diffuse abdominal cramping. Patient denies any chest pain, shortness of breath, urinary symptoms, vaginal discharge or bleeding, pelvic pain. She has not taken any medications at home for her symptoms.  HPI  Past Medical History:  Diagnosis Date  . Epilepsy (HCC)   . Migraine   . Seizures (HCC) 07/18/2016    There are no active problems to display for this patient.   History reviewed. No pertinent surgical history.  OB History    No data available       Home Medications    Prior to Admission medications   Medication Sig Start Date End Date Taking? Authorizing Provider  levETIRAcetam (KEPPRA) 250 MG tablet Take 1 tablet (250 mg total) by mouth 2 (two) times daily. 08/13/16  Yes Suanne Marker, MD  naproxen sodium (ANAPROX) 220 MG tablet Take 220 mg by mouth 2 (two) times daily as needed (for pain).   Yes Historical Provider, MD  SUMAtriptan (IMITREX) 100 MG tablet Take 1 tablet (100 mg total) by mouth every 2 (two) hours as needed for migraine. May repeat in 2 hours if headache persists or recurs. 08/13/16  Yes Suanne Marker, MD  topiramate (TOPAMAX) 100 MG tablet Take 1 tablet (100 mg total) by mouth 2 (two) times daily. 08/13/16  Yes Suanne Marker, MD  acetaminophen (TYLENOL) 500 MG tablet Take 1 tablet (500 mg total) by  mouth every 6 (six) hours as needed. 10/21/16   Emi Holes, PA-C  ibuprofen (ADVIL,MOTRIN) 800 MG tablet Take 1 tablet (800 mg total) by mouth 3 (three) times daily. 10/21/16   Emi Holes, PA-C  lacosamide (VIMPAT) 50 MG TABS tablet Take 1 tablet (50 mg total) by mouth 2 (two) times daily. 08/22/16   Suanne Marker, MD    Family History Family History  Problem Relation Age of Onset  . Seizures Other     Social History Social History  Substance Use Topics  . Smoking status: Current Every Day Smoker    Types: Cigarettes  . Smokeless tobacco: Never Used     Comment: 1/30 avg 2/day  . Alcohol use No     Allergies   Keppra [levetiracetam]   Review of Systems Review of Systems  Constitutional: Negative for chills and fever.  HENT: Negative for facial swelling and sore throat.   Respiratory: Negative for shortness of breath.   Cardiovascular: Negative for chest pain.  Gastrointestinal: Positive for abdominal pain (diffuse cramping), diarrhea (one episode) and nausea. Negative for blood in stool and vomiting.  Genitourinary: Negative for dysuria, flank pain, frequency, pelvic pain, urgency, vaginal bleeding, vaginal discharge and vaginal pain.  Musculoskeletal: Negative for back pain.  Skin: Negative for rash and wound.  Neurological: Negative for headaches.  Psychiatric/Behavioral: The patient is not nervous/anxious.      Physical Exam Updated Vital Signs BP 132/85 (BP Location:  Left Arm)   Pulse 90   Temp 98.2 F (36.8 C) (Oral)   Resp 18   Wt 64.9 kg   LMP 10/03/2016 Comment: negative HCG 10-21-2016  SpO2 100%   BMI 22.74 kg/m   Physical Exam  Constitutional: She appears well-developed and well-nourished. No distress.  HENT:  Head: Normocephalic and atraumatic.  Mouth/Throat: Oropharynx is clear and moist. No oropharyngeal exudate.  Eyes: Conjunctivae are normal. Pupils are equal, round, and reactive to light. Right eye exhibits no discharge. Left eye  exhibits no discharge. No scleral icterus.  Neck: Normal range of motion. Neck supple. No thyromegaly present.  Cardiovascular: Normal rate, regular rhythm, normal heart sounds and intact distal pulses.  Exam reveals no gallop and no friction rub.   No murmur heard. Pulmonary/Chest: Effort normal and breath sounds normal. No stridor. No respiratory distress. She has no wheezes. She has no rales.  Abdominal: Soft. Bowel sounds are normal. She exhibits no distension. There is generalized tenderness (cramping sensation on palpation). There is no rebound, no guarding and no tenderness at McBurney's point.  Genitourinary: Pelvic exam was performed with patient supine. There is no rash, tenderness or lesion on the right labia. There is no rash, tenderness or lesion on the left labia. Cervix exhibits no motion tenderness. Right adnexum displays no mass, no tenderness and no fullness. Left adnexum displays no mass, no tenderness and no fullness. Vaginal discharge (white, could be physiologic) found.  Musculoskeletal: She exhibits no edema.  Lymphadenopathy:    She has no cervical adenopathy.  Neurological: She is alert. Coordination normal.  Skin: Skin is warm and dry. No rash noted. She is not diaphoretic. No pallor.  Psychiatric: She has a normal mood and affect.  Nursing note and vitals reviewed.    ED Treatments / Results  Labs (all labs ordered are listed, but only abnormal results are displayed) Labs Reviewed  WET PREP, GENITAL - Abnormal; Notable for the following:       Result Value   Clue Cells Wet Prep HPF POC PRESENT (*)    WBC, Wet Prep HPF POC FEW (*)    All other components within normal limits  COMPREHENSIVE METABOLIC PANEL - Abnormal; Notable for the following:    Potassium 3.3 (*)    Chloride 116 (*)    CO2 19 (*)    ALT 13 (*)    All other components within normal limits  URINALYSIS, ROUTINE W REFLEX MICROSCOPIC - Abnormal; Notable for the following:    APPearance CLOUDY  (*)    Ketones, ur 5 (*)    All other components within normal limits  LIPASE, BLOOD  CBC  RPR  HIV ANTIBODY (ROUTINE TESTING)  I-STAT BETA HCG BLOOD, ED ([facility], [facility], [facility] ONLY)  I-STAT BETA HCG BLOOD, ED ([facility], [facility], [facility] ONLY)  GC/CHLAMYDIA PROBE AMP ([place]) NOT AT [facility]    EKG  EKG Interpretation None       Radiology Ct Abdomen Pelvis W Contrast  Result Date: 10/21/2016 CLINICAL DATA:  Generalized abdominal pain for several hours, initial encounter EXAM: CT ABDOMEN AND PELVIS WITH CONTRAST TECHNIQUE: Multidetector CT imaging of the abdomen and pelvis was performed using the standard protocol following bolus administration of intravenous contrast. CONTRAST:  ISOVUE-300 IOPAMIDOL (ISOVUE-300) INJECTION 61% COMPARISON:  None. FINDINGS: Lower chest: No acute abnormality. Hepatobiliary: No focal liver abnormality is seen. No gallstones, gallbladder wall thickening, or biliary dilatation. Pancreas: Unremarkable. No pancreatic ductal dilatation or surrounding inflammatory changes. Spleen: Normal in size without  focal abnormality. Adrenals/Urinary Tract: Adrenal glands are unremarkable. Kidneys are normal, without renal calculi, focal lesion, or hydronephrosis. Bladder is unremarkable. Stomach/Bowel: Stomach is within normal limits. Appendix appears normal. No evidence of bowel wall thickening, distention, or inflammatory changes. Vascular/Lymphatic: No significant vascular findings are present. No enlarged abdominal or pelvic lymph nodes. Reproductive: Uterus is well visualized and within normal limits. Bilateral ovarian cystic changes are noted right slightly greater than left. Minimal free pelvic fluid is noted. The possibility of recent ovarian cyst rupture would deserve consideration although this may be physiologic in nature. Other: No abdominal wall hernia or abnormality. No abdominopelvic ascites. Musculoskeletal: No acute or significant osseous findings. IMPRESSION: Ovarian cystic change as  well as minimal free pelvic fluid. This may be related to recent ovarian cyst rupture. No other focal abnormality is seen. Electronically Signed   By: Alcide Clever M.D.   On: 10/21/2016 15:07    Procedures Procedures (including critical care time)  Medications Ordered in ED Medications  sodium chloride 0.9 % bolus 1,000 mL (0 mLs Intravenous Stopped 10/21/16 1320)  ondansetron (ZOFRAN) injection 4 mg (4 mg Intravenous Given 10/21/16 1145)  ketorolac (TORADOL) 30 MG/ML injection 30 mg (30 mg Intravenous Given 10/21/16 1222)  morphine 2 MG/ML injection 4 mg (4 mg Intravenous Given 10/21/16 1417)  iopamidol (ISOVUE-300) 61 % injection 100 mL (100 mLs Intravenous Contrast Given 10/21/16 1433)  potassium chloride SA (K-DUR,KLOR-CON) CR tablet 40 mEq (40 mEq Oral Given 10/21/16 1651)  acetaminophen (TYLENOL) tablet 650 mg (650 mg Oral Given 10/21/16 1716)     Initial Impression / Assessment and Plan / ED Course  I have reviewed the triage vital signs and the nursing notes.  Pertinent labs & imaging results that were available during my care of the patient were reviewed by me and considered in my medical decision making (see chart for details).     Patient with ovarian cystic change and probable ruptured ovarian cyst on CT scan. CBC unremarkable. CMP shows potassium 3.3, chloride 116. Potassium orally replaced in the ED. Pregnancy negative. White discharge found on pelvic exam, could be physiologic. Wet prep shows clue cells, few WBCs. Considering no change in vaginal discharge according to patient, will not treat for BV at this time.  GC/chlamydia, HIV, RPR sent. Patient advised she will be called in 2-3 days for any positive results. She is advised to follow-up with OB/GYN for annual exam and further evaluation of her ovarian cyst. We'll discharge home with ibuprofen and Tylenol for pain control. Return precautions discussed. Patient understands and agrees with plan. Patient vitals stable throughout ED course  and discharged in satisfactory condition.  Final Clinical Impressions(s) / ED Diagnoses   Final diagnoses:  Ruptured ovarian cyst  Cysts of both ovaries    New Prescriptions Discharge Medication List as of 10/21/2016  4:43 PM    START taking these medications   Details  ibuprofen (ADVIL,MOTRIN) 800 MG tablet Take 1 tablet (800 mg total) by mouth 3 (three) times daily., Starting Mon 10/21/2016, Print         Emi Holes, PA-C 10/21/16 1818    Mancel Bale, MD 10/21/16 1610    Mancel Bale, MD 10/21/16 2108

## 2016-10-21 NOTE — ED Notes (Signed)
Attempt IV to L AC and L hand unsuccessful. Kerman Passey verbalizes will attempt IV.

## 2016-10-21 NOTE — ED Notes (Signed)
[place] MD saw pt. Per MD, pt clear for discharge

## 2016-10-21 NOTE — ED Provider Notes (Signed)
  Face-to-face evaluation   History: She reports onset of abdominal pain last evening at 8:00 spontaneously.  The pain is persistent.  She has some vomiting but is not different than her usual vomiting, which she has frequently because she takes "Topamax".  Physical exam: Alert, cooperative.  She prefers to lie in the left lateral decubitus position because she states it makes her abdominal pain better.  Abdomen is soft, there is mild diffuse tenderness with somewhat increased tenderness in the left midabdomen.  There is mild guarding in the left abdomen, but no rebound tenderness.  Medical screening examination/treatment/procedure(s) were conducted as a shared visit with non-physician practitioner(s) and myself.  I personally evaluated the patient during the encounter   Mancel Bale, MD 10/21/16 2108

## 2016-10-21 NOTE — Discharge Instructions (Signed)
Medications: Ibuprofen  Treatment: Take ibuprofen every 8 hours as needed for your pain. Do not take this medication with Aleve, naproxen or Naprosyn, as these are all anti-inflammatory medications and should not be taken together. You can alternate ibuprofen and Tylenol as prescribed.  Follow-up: Please follow-up with the women's outpatient clinic for further evaluation and treatment of your ovarian cysts as well as your annual pelvic exam and Pap smear. Please follow-up and establish care with either the [facility] or [facility] the [facility] cell center. Please return to emergency department or go to the [facility] emergency department if you develop any new or worsening symptoms related to this pain or other female complaints.

## 2016-10-21 NOTE — ED Notes (Signed)
Marvell Fuller RN verbalizes not able to draw sufficient blood for sample.

## 2016-10-21 NOTE — ED Notes (Signed)
Pt denies headache at present time; pt verbalizes ONLY generalized abdominal pain 7/10 at present time.

## 2016-10-21 NOTE — ED Notes (Signed)
Pt verbalizes "pain going up again." [place] oncoming PA notified and verbalizes will assess pt.

## 2016-10-21 NOTE — ED Notes (Addendum)
Pt to speak with another/different attending MD per request.

## 2016-10-21 NOTE — ED Triage Notes (Signed)
Per EMS pt complaint of generalized abdominal cramping onset 2000 with associated nausea.

## 2016-10-21 NOTE — ED Notes (Signed)
Patient allowed this tech to stick in the the hand. Stick was unsuccessful. RN made aware. Patient could potentially benefit from fluids.

## 2016-10-21 NOTE — ED Notes (Signed)
Pt verbalizes upset with staff related to no admission or other pain medication administered prior to discharge. [place] notified pt request for something "other than medications I [pt] have at home." [facility] verbalizes continue to discharge pt no other medications will be administered. Pt offered wheelchair and refuses. Pt also refuses to rate pain at present time verbalizing "why are you going to rate my pain when you are sending me out in pain?" Pt walking out of department with steady gait by [employer].

## 2016-10-21 NOTE — ED Notes (Addendum)
At time of discharge pt verbalizes "I don't think I need to go home; I still feel week." [place] PA notified. Post complaint PA spoke with pt about concerns. [place] discussed with pt results, discharge, and follow up. Pt to be discharged with family home.

## 2016-10-21 NOTE — ED Notes (Signed)
Bed: WA15 Expected date:  Expected time:  Means of arrival:  Comments: EMS/abd. pain 

## 2016-10-21 NOTE — ED Notes (Signed)
PA notified of pt current pain status post pain medication intervention; continues to rate 7/10 unchanged pain response.

## 2016-10-22 ENCOUNTER — Emergency Department (HOSPITAL_COMMUNITY)
Admission: EM | Admit: 2016-10-22 | Discharge: 2016-10-22 | Disposition: A | Discharge status: Home / Self Care | Payer: Self-pay | Attending: Emergency Medicine | Admitting: Emergency Medicine

## 2016-10-22 ENCOUNTER — Encounter (HOSPITAL_COMMUNITY): Payer: Self-pay

## 2016-10-22 ENCOUNTER — Emergency Department (HOSPITAL_COMMUNITY)
Admission: EM | Admit: 2016-10-22 | Discharge: 2016-10-22 | Disposition: A | Discharge status: Home / Self Care | Payer: Medicaid Other | Attending: Emergency Medicine | Admitting: Emergency Medicine

## 2016-10-22 ENCOUNTER — Emergency Department (HOSPITAL_COMMUNITY): Payer: Self-pay

## 2016-10-22 DIAGNOSIS — F17210 Nicotine dependence, cigarettes, uncomplicated: Secondary | ICD-10-CM | POA: Insufficient documentation

## 2016-10-22 DIAGNOSIS — R1084 Generalized abdominal pain: Secondary | ICD-10-CM

## 2016-10-22 DIAGNOSIS — K8020 Calculus of gallbladder without cholecystitis without obstruction: Secondary | ICD-10-CM | POA: Insufficient documentation

## 2016-10-22 DIAGNOSIS — R52 Pain, unspecified: Secondary | ICD-10-CM

## 2016-10-22 LAB — I-STAT BETA HCG BLOOD, ED (MC, WL, AP ONLY): I-stat hCG, quantitative: <5 m[IU]/mL (ref <5)

## 2016-10-22 LAB — COMPREHENSIVE METABOLIC PANEL
ALT: 15 U/L (ref 14–54)
AST: 23 U/L (ref 15–41)
Albumin: 4.9 g/dL (ref 3.5–5.0)
Alkaline Phosphatase: 78 U/L (ref 38–126)
Anion gap: 10 (ref 5–15)
BUN: <5 mg/dL — ABNORMAL LOW (ref 6–20)
CO2: 18 mmol/L — ABNORMAL LOW (ref 22–32)
Calcium: 9.8 mg/dL (ref 8.9–10.3)
Chloride: 111 mmol/L (ref 101–111)
Creatinine, Ser: 0.76 mg/dL (ref 0.44–1.00)
GFR calc Af Amer: >60 mL/min (ref >60)
GFR calc non Af Amer: >60 mL/min (ref >60)
Glucose, Bld: 105 mg/dL — ABNORMAL HIGH (ref 65–99)
Potassium: 3.4 mmol/L — ABNORMAL LOW (ref 3.5–5.1)
Sodium: 139 mmol/L (ref 135–145)
Total Bilirubin: 0.6 mg/dL (ref 0.3–1.2)
Total Protein: 7.6 g/dL (ref 6.5–8.1)

## 2016-10-22 LAB — URINALYSIS, ROUTINE W REFLEX MICROSCOPIC
Bilirubin Urine: NEGATIVE
Bilirubin Urine: NEGATIVE
GLUCOSE, UA: NEGATIVE mg/dL
Glucose, UA: NEGATIVE mg/dL
HGB URINE DIPSTICK: NEGATIVE
Hgb urine dipstick: NEGATIVE
Ketones, ur: 15 mg/dL — AB
Ketones, ur: 20 mg/dL — AB
LEUKOCYTES UA: NEGATIVE
Leukocytes, UA: NEGATIVE
Nitrite: NEGATIVE
Nitrite: NEGATIVE
PROTEIN: NEGATIVE mg/dL
Protein, ur: NEGATIVE mg/dL
Specific Gravity, Urine: 1.01 (ref 1.005–1.030)
Specific Gravity, Urine: 1.006 (ref 1.005–1.030)
pH: 6.5 (ref 5.0–8.0)
pH: 7 (ref 5.0–8.0)

## 2016-10-22 LAB — LIPASE, BLOOD: Lipase: 15 U/L (ref 11–51)

## 2016-10-22 LAB — CBC
HCT: 40.7 % (ref 36.0–46.0)
Hemoglobin: 13.9 g/dL (ref 12.0–15.0)
MCH: 33.8 pg (ref 26.0–34.0)
MCHC: 34.2 g/dL (ref 30.0–36.0)
MCV: 99 fL (ref 78.0–100.0)
Platelets: 244 10*3/uL (ref 150–400)
RBC: 4.11 MIL/uL (ref 3.87–5.11)
RDW: 12.6 % (ref 11.5–15.5)
WBC: 5.1 10*3/uL (ref 4.0–10.5)

## 2016-10-22 LAB — HIV ANTIBODY (ROUTINE TESTING W REFLEX): HIV Screen 4th Generation wRfx: NONREACTIVE

## 2016-10-22 LAB — RPR: RPR Ser Ql: NONREACTIVE

## 2016-10-22 MED ORDER — LORAZEPAM 2 MG/ML IJ SOLN
0.5000 mg | Freq: Once | INTRAMUSCULAR | Status: AC
  Administered 2016-10-22: 0.5 mg via INTRAVENOUS
  Filled 2016-10-22: qty 1

## 2016-10-22 MED ORDER — GI COCKTAIL ~~LOC~~
30.0000 mL | Freq: Once | ORAL | Status: AC
  Administered 2016-10-22: 30 mL via ORAL
  Filled 2016-10-22: qty 30

## 2016-10-22 MED ORDER — MORPHINE SULFATE (PF) 4 MG/ML IV SOLN
4.0000 mg | Freq: Once | INTRAVENOUS | Status: AC
  Administered 2016-10-22: 4 mg via INTRAVENOUS
  Filled 2016-10-22: qty 1

## 2016-10-22 MED ORDER — PROMETHAZINE HCL 25 MG/ML IJ SOLN
12.5000 mg | Freq: Once | INTRAMUSCULAR | Status: AC
  Administered 2016-10-22: 12.5 mg via INTRAVENOUS
  Filled 2016-10-22: qty 1

## 2016-10-22 MED ORDER — OXYCODONE-ACETAMINOPHEN 5-325 MG PO TABS: 1.0000 | ORAL_TABLET | ORAL | 0 refills | Status: DC | PRN

## 2016-10-22 MED ORDER — SODIUM CHLORIDE 0.9 % IV BOLUS (SEPSIS)
1000.0000 mL | Freq: Once | INTRAVENOUS | Status: AC
  Administered 2016-10-22: 1000 mL via INTRAVENOUS

## 2016-10-22 MED ORDER — ONDANSETRON HCL 4 MG PO TABS: 4.0000 mg | ORAL_TABLET | Freq: Four times a day (QID) | ORAL | 0 refills | Status: DC

## 2016-10-22 NOTE — ED Provider Notes (Signed)
[facility]-EMERGENCY DEPT Provider Note   CSN: [id number] Arrival date & time: 10/22/16  4098  By signing my name below, I, Sonum Patel, attest that this documentation has been prepared under the direction and in the presence of Raeford Razor, MD. Electronically Signed: Sonum Patel, Neurosurgeon. 10/22/16. 12:03 PM.  History   Chief Complaint Chief Complaint  Patient presents with  . Abdominal Pain    The history is provided by the patient. No language interpreter was used.     HPI Comments: Lisa Shaw is a 26 y.o. female who presents to the Emergency Department complaining of persistent periumbilical abdominal pain with associated nausea and dry mouth that began 2 days ago. She describes the pain as a cramping sensation. She also reports a BM this morning with streaks of blood and notes having rectal pain at that time. She was seen in the ED on 10/21/16 and was diagnosed with a probable ruptured ovarian cyst and was discharged home to follow up with an Ob-Gyn. She denies dysuria, vaginal bleeding, vaginal discharge, fever.   Past Medical History:  Diagnosis Date  . Epilepsy (HCC)   . Migraine   . Seizures (HCC) 07/18/2016    There are no active problems to display for this patient.   History reviewed. No pertinent surgical history.  OB History    No data available       Home Medications    Prior to Admission medications   Medication Sig Start Date End Date Taking? Authorizing Provider  acetaminophen (TYLENOL) 500 MG tablet Take 1 tablet (500 mg total) by mouth every 6 (six) hours as needed. 10/21/16   Emi Holes, PA-C  ibuprofen (ADVIL,MOTRIN) 800 MG tablet Take 1 tablet (800 mg total) by mouth 3 (three) times daily. 10/21/16   Emi Holes, PA-C  lacosamide (VIMPAT) 50 MG TABS tablet Take 1 tablet (50 mg total) by mouth 2 (two) times daily. 08/22/16   Suanne Marker, MD  levETIRAcetam (KEPPRA) 250 MG tablet Take 1 tablet (250 mg total) by mouth 2 (two) times daily.  08/13/16   Suanne Marker, MD  naproxen sodium (ANAPROX) 220 MG tablet Take 220 mg by mouth 2 (two) times daily as needed (for pain).    Historical Provider, MD  SUMAtriptan (IMITREX) 100 MG tablet Take 1 tablet (100 mg total) by mouth every 2 (two) hours as needed for migraine. May repeat in 2 hours if headache persists or recurs. 08/13/16   Suanne Marker, MD  topiramate (TOPAMAX) 100 MG tablet Take 1 tablet (100 mg total) by mouth 2 (two) times daily. 08/13/16   Suanne Marker, MD    Family History Family History  Problem Relation Age of Onset  . Seizures Other     Social History Social History  Substance Use Topics  . Smoking status: Current Every Day Smoker    Types: Cigarettes  . Smokeless tobacco: Never Used     Comment: 1/30 avg 2/day  . Alcohol use No     Allergies   Keppra [levetiracetam]   Review of Systems Review of Systems  A complete 10 system review of systems was obtained and all systems are negative except as noted in the HPI and PMH.    Physical Exam Updated Vital Signs BP 102/76   Pulse 85   Temp 98.7 F (37.1 C) (Oral)   Resp 17   Ht  (1.676 m)   Wt 143 lb (64.9 kg)   LMP 10/03/2016 Comment: negative HCG  blood test 10-21-2016  SpO2 100%   BMI 23.08 kg/m   Physical Exam  Constitutional: She is oriented to person, place, and time. She appears well-developed and well-nourished. No distress.  Anxious   HENT:  Head: Normocephalic and atraumatic.  Eyes: EOM are normal.  Neck: Normal range of motion.  Cardiovascular: Normal rate, regular rhythm and normal heart sounds.   Pulmonary/Chest: Effort normal and breath sounds normal.  Abdominal: Soft. She exhibits no distension. There is tenderness (diffuse). There is no rebound and no guarding.  Musculoskeletal: Normal range of motion.  Neurological: She is alert and oriented to person, place, and time.  Skin: Skin is warm and dry.  Psychiatric: She has a normal mood and affect. Judgment  normal.  Nursing note and vitals reviewed.    ED Treatments / Results  DIAGNOSTIC STUDIES: Oxygen Saturation is 100% on RA, normal by my interpretation.    COORDINATION OF CARE: 12:00 PM Discussed treatment plan with pt at bedside and pt agreed to plan.   Labs (all labs ordered are listed, but only abnormal results are displayed) Labs Reviewed  COMPREHENSIVE METABOLIC PANEL - Abnormal; Notable for the following:       Result Value   Potassium 3.4 (*)    CO2 18 (*)    Glucose, Bld 105 (*)    BUN <5 (*)    All other components within normal limits  URINALYSIS, ROUTINE W REFLEX MICROSCOPIC - Abnormal; Notable for the following:    Ketones, ur 15 (*)    All other components within normal limits  LIPASE, BLOOD  CBC  I-STAT BETA HCG BLOOD, ED ([facility], [facility], [facility] ONLY)    EKG  EKG Interpretation None       Radiology Ct Abdomen Pelvis W Contrast  Result Date: 10/21/2016 CLINICAL DATA:  Generalized abdominal pain for several hours, initial encounter EXAM: CT ABDOMEN AND PELVIS WITH CONTRAST TECHNIQUE: Multidetector CT imaging of the abdomen and pelvis was performed using the standard protocol following bolus administration of intravenous contrast. CONTRAST:  ISOVUE-300 IOPAMIDOL (ISOVUE-300) INJECTION 61% COMPARISON:  None. FINDINGS: Lower chest: No acute abnormality. Hepatobiliary: No focal liver abnormality is seen. No gallstones, gallbladder wall thickening, or biliary dilatation. Pancreas: Unremarkable. No pancreatic ductal dilatation or surrounding inflammatory changes. Spleen: Normal in size without focal abnormality. Adrenals/Urinary Tract: Adrenal glands are unremarkable. Kidneys are normal, without renal calculi, focal lesion, or hydronephrosis. Bladder is unremarkable. Stomach/Bowel: Stomach is within normal limits. Appendix appears normal. No evidence of bowel wall thickening, distention, or inflammatory changes. Vascular/Lymphatic: No significant vascular findings are present.  No enlarged abdominal or pelvic lymph nodes. Reproductive: Uterus is well visualized and within normal limits. Bilateral ovarian cystic changes are noted right slightly greater than left. Minimal free pelvic fluid is noted. The possibility of recent ovarian cyst rupture would deserve consideration although this may be physiologic in nature. Other: No abdominal wall hernia or abnormality. No abdominopelvic ascites. Musculoskeletal: No acute or significant osseous findings. IMPRESSION: Ovarian cystic change as well as minimal free pelvic fluid. This may be related to recent ovarian cyst rupture. No other focal abnormality is seen. Electronically Signed   By: Alcide Clever M.D.   On: 10/21/2016 15:07    Procedures Procedures (including critical care time)  Medications Ordered in ED Medications - No data to display   Initial Impression / Assessment and Plan / ED Course  I have reviewed the triage vital signs and the nursing notes.  Pertinent labs & imaging results that were available  during my care of the patient were reviewed by me and considered in my medical decision making (see chart for details).     25yF with multiple complaints, but primarily abdominal pain. I suspect her pain may be from cholelithiasis. CT done yesterday. Small amount of free fluid in pelvis. I'm not convinced her pain is from an ovarian cyst though. Pain diffuse, but seems more upper abdominal tenderness on my exam today. [name] with gallstones. No sonographic signs of cholelithiasis. Afebrile. LFTs/lipase normal. HD stable.   Discussed my plan with patient but she is unwilling to be discharged. She wants to speak with a Emergency planning/management officer. I'm not sure why.  "You're not doing anything for me." She feels like this if because she does not have insurance. I explained to her that her ability to pay is irrelevant and that this is simply the standard of care. I am not going to involve surgery simply because she is unhappy.   I explained  to her that she has had what I consider to be fairly extensive work-up including being evaluated by multiple different providers, labs, urine, pelvic examination, CT of her abdomen/pelvis and an ultrasound between her ED visits yesterday and today. I acknowledged her concerns but explained that from an emergency medicine standpoint, I feel she is safe for discharge. She has numerous other complaints and is perseverating on her blood pressure. She needs to establish a PCP. She was prescribed percocet & zofran to take PRN. She was provided with general surgery follow-up information.   Final Clinical Impressions(s) / ED Diagnoses   Final diagnoses:  Pain  Calculus of gallbladder without cholecystitis without obstruction    New Prescriptions New Prescriptions   No medications on file   I personally preformed the services scribed in my presence. The recorded information has been reviewed is accurate. Raeford Razor, MD.     Raeford Razor, MD 10/22/16 [phone]

## 2016-10-22 NOTE — ED Notes (Signed)
Patient transported to Ultrasound 

## 2016-10-22 NOTE — ED Triage Notes (Signed)
Pt presents to the ed with multiple complaints. States that she went to [place] yesterday and she was told that she had a ruptured cyst on her ovary and something wrong with her gallbladder. States that today she started feeling worse. Her abdominal pain is worse, she feels short of breath sometimes and is still feeling nauseous with some lower back pain. Patient is not short of breath in triage.

## 2016-10-22 NOTE — Discharge Instructions (Signed)
As discussed, it is important to use the previously prescribed medication, follow-up with both general surgery and your primary care physician.

## 2016-10-22 NOTE — ED Notes (Signed)
Pt angry. Refusing IV deaccess. Pt does not want to be DC'd. MD has spoken with patient twice now about discharge instructions. PT states she wants a security guard. Security paged to escort patient out. Pt has been given her DC instructions.

## 2016-10-22 NOTE — ED Provider Notes (Signed)
[facility]ENCY DEPT Provider Note   CSN: [id number] Arrival date & time: 10/22/16  1418     History   Chief Complaint Chief Complaint  Patient presents with  . Abdominal Pain    HPI Lisa Shaw is a 26 y.o. female.  HPI  Patient presents for the second time in 6 hours, third time in 24 hrs., with concern for ongoing abdominal discomfort. She notes that her pain began suddenly 2 days ago when she developed lower abdominal pain. Since that time she has had some discomfort, nausea, generalized discomfort as well. After the symptoms persisted for a day, the patient went to our affiliated facility. There, the patient had laboratory evaluation, CT scan. Patient was diagnosed with likely ruptured ovarian cyst, discharged to follow-up as an outpatient. However, the patient did not fill her prescriptions, and presented to this facility about 8 hours ago. During her percent evaluation today the patient had ultrasound, labs, and notes that on discharge, she was unsatisfied, wanted to have surgical evaluation. Patient did not fill her prescriptions on discharge from evaluation here, checked back in for evaluation. No new complaints over the short interval, she is concerned over ongoing discomfort, but no new fever, vomiting.    Past Medical History:  Diagnosis Date  . Epilepsy (HCC)   . Migraine   . Seizures (HCC) 07/18/2016    There are no active problems to display for this patient.   History reviewed. No pertinent surgical history.  OB History    No data available       Home Medications    Prior to Admission medications   Medication Sig Start Date End Date Taking? Authorizing Provider  ibuprofen (ADVIL,MOTRIN) 200 MG tablet Take 200 mg by mouth every 6 (six) hours as needed for headache or moderate pain.    Historical Provider, MD  levETIRAcetam (KEPPRA) 250 MG tablet Take 1 tablet (250 mg total) by mouth 2 (two) times daily. 08/13/16   Suanne Marker, MD    naproxen sodium (ANAPROX) 220 MG tablet Take 220 mg by mouth 2 (two) times daily as needed (for pain).    Historical Provider, MD  ondansetron (ZOFRAN) 4 MG tablet Take 1 tablet (4 mg total) by mouth every 6 (six) hours. 10/22/16   Raeford Razor, MD  oxyCODONE-acetaminophen (PERCOCET/ROXICET) 5-325 MG tablet Take 1-2 tablets by mouth every 4 (four) hours as needed for severe pain. 10/22/16   Raeford Razor, MD  SUMAtriptan (IMITREX) 100 MG tablet Take 1 tablet (100 mg total) by mouth every 2 (two) hours as needed for migraine. May repeat in 2 hours if headache persists or recurs. 08/13/16   Suanne Marker, MD  topiramate (TOPAMAX) 100 MG tablet Take 1 tablet (100 mg total) by mouth 2 (two) times daily. 08/13/16   Suanne Marker, MD    Family History Family History  Problem Relation Age of Onset  . Seizures Other     Social History Social History  Substance Use Topics  . Smoking status: Current Every Day Smoker    Types: Cigarettes  . Smokeless tobacco: Never Used     Comment: 1/30 avg 2/day  . Alcohol use No     Allergies   Keppra [levetiracetam]   Review of Systems Review of Systems  Constitutional:       Per HPI, otherwise negative  HENT:       Per HPI, otherwise negative  Respiratory:       Per HPI, otherwise negative  Cardiovascular:  Per HPI, otherwise negative  Gastrointestinal: Positive for abdominal pain. Negative for vomiting.  Endocrine:       Negative aside from HPI  Genitourinary:       Neg aside from HPI   Musculoskeletal:       Per HPI, otherwise negative  Skin: Negative.   Neurological: Negative for syncope.     Physical Exam Updated Vital Signs BP 125/84 (BP Location: Left Arm)   Pulse 97   Temp 97.3 F (36.3 C) (Oral)   Resp 13   Ht  (1.727 m)   Wt 150 lb (68 kg)   LMP 10/03/2016 Comment: negative HCG blood test 10-21-2016  SpO2 100%   BMI 22.81 kg/m   Physical Exam  Constitutional: She is oriented to person, place, and  time. She appears well-developed and well-nourished. No distress.  HENT:  Head: Normocephalic and atraumatic.  Eyes: Conjunctivae and EOM are normal.  Pulmonary/Chest: Effort normal. No stridor. No respiratory distress.  Abdominal: She exhibits no distension.  Musculoskeletal: She exhibits no edema.  Neurological: She is alert and oriented to person, place, and time. No cranial nerve deficit.  Skin: Skin is warm and dry.  Psychiatric: Her mood appears anxious. She is aggressive.  Nursing note and vitals reviewed.    ED Treatments / Results  Labs (all labs ordered are listed, but only abnormal results are displayed) Labs Reviewed  URINALYSIS, ROUTINE W REFLEX MICROSCOPIC - Abnormal; Notable for the following:       Result Value   Color, Urine STRAW (*)    Ketones, ur 20 (*)    All other components within normal limits   Radiology Ct Abdomen Pelvis W Contrast  Result Date: 10/21/2016 CLINICAL DATA:  Generalized abdominal pain for several hours, initial encounter EXAM: CT ABDOMEN AND PELVIS WITH CONTRAST TECHNIQUE: Multidetector CT imaging of the abdomen and pelvis was performed using the standard protocol following bolus administration of intravenous contrast. CONTRAST:  ISOVUE-300 IOPAMIDOL (ISOVUE-300) INJECTION 61% COMPARISON:  None. FINDINGS: Lower chest: No acute abnormality. Hepatobiliary: No focal liver abnormality is seen. No gallstones, gallbladder wall thickening, or biliary dilatation. Pancreas: Unremarkable. No pancreatic ductal dilatation or surrounding inflammatory changes. Spleen: Normal in size without focal abnormality. Adrenals/Urinary Tract: Adrenal glands are unremarkable. Kidneys are normal, without renal calculi, focal lesion, or hydronephrosis. Bladder is unremarkable. Stomach/Bowel: Stomach is within normal limits. Appendix appears normal. No evidence of bowel wall thickening, distention, or inflammatory changes. Vascular/Lymphatic: No significant vascular  findings are present. No enlarged abdominal or pelvic lymph nodes. Reproductive: Uterus is well visualized and within normal limits. Bilateral ovarian cystic changes are noted right slightly greater than left. Minimal free pelvic fluid is noted. The possibility of recent ovarian cyst rupture would deserve consideration although this may be physiologic in nature. Other: No abdominal wall hernia or abnormality. No abdominopelvic ascites. Musculoskeletal: No acute or significant osseous findings. IMPRESSION: Ovarian cystic change as well as minimal free pelvic fluid. This may be related to recent ovarian cyst rupture. No other focal abnormality is seen. Electronically Signed   By: Alcide Clever M.D.   On: 10/21/2016 15:07   US Abdomen Limited Ruq  Result Date: 10/22/2016 CLINICAL DATA:  Right upper quadrant pain for 3 days EXAM: US ABDOMEN LIMITED - RIGHT UPPER QUADRANT COMPARISON:  None. FINDINGS: Gallbladder: Cholelithiasis with the largest gallstone measuring 2 cm. No pericholecystic fluid or gallbladder wall thickening. Negative sonographic Murphy sign. Common bile duct: Diameter: 1.7 mm Liver: No focal lesion identified. Within normal limits  in parenchymal echogenicity. IMPRESSION: 1. Cholelithiasis without sonographic evidence of acute cholecystitis. Electronically Signed   By: Elige Ko   On: 10/22/2016 13:13    Procedures Procedures (including critical care time)  Medications Ordered in ED Medications - No data to display   Initial Impression / Assessment and Plan / ED Course  I have reviewed the triage vital signs and the nursing notes.  Pertinent labs & imaging results that were available during my care of the patient were reviewed by me and considered in my medical decision making (see chart for details).   After my initial evaluation I reviewed the findings from earlier today and yesterday with the patient, including review of the CT imaging, radiology interpretation. Specifically we  discussed the absence of evidence for acute appendicitis, acute cholecystitis, new infectious pathology, laboratory abnormalities. Patient's presentation is consistent with ruptured ovarian cyst, with secondary finding of small gallstone, but no evidence for cholecystitis. Patient voiced displeasure at her results, requested additional imaging today. Attempted to explain that additional CT imaging within 24 hours, was likely to cause additional harm with radiation exposure rather than benefit, particularly given the absence of evidence for peritonitis, or hemodynamic instability or abnormal labs, with aforementioned reassuring findings. Patient requested emergent surgery evaluation. She does not be criteria for this, and I attempted to explain this to the patient at length. Patient was provided discharge instructions, encouraged to use her previously prescribed medication, and follow-up as an outpatient. Absent evidence for new pathology, peritonitis, bacteremia, sepsis, acute abdominal issues, the patient does not meet any criteria for admission, additional imaging, emergent consultation. Patient required escort from the facility.   Final Clinical Impressions(s) / ED Diagnoses   Final diagnoses:  Generalized abdominal pain     Gerhard Munch, MD 10/22/16 [phone]

## 2016-10-22 NOTE — ED Notes (Signed)
Pt allowed me to remove her IV

## 2016-10-22 NOTE — ED Notes (Signed)
Pt stable, understands discharge instructions, and reasons for return.   

## 2016-10-22 NOTE — ED Triage Notes (Addendum)
Per Pt, Pt is coming from home with complaints of abdominal pain. Pt reports being diagnosed with ovarian cyst, but pain persists. Complains of burning with urination, headaches, and weakness

## 2016-10-23 LAB — GC/CHLAMYDIA PROBE AMP (~~LOC~~) NOT AT ARMC
Chlamydia: NEGATIVE
Neisseria Gonorrhea: NEGATIVE

## 2016-10-23 NOTE — Progress Notes (Signed)
Front office transferred call due to pt wanting to know what do about her abdominal pain.  Looking in pt's chart pt has cholestasis and I informed pt that she needed to see a PCP for her abdominal pain they would better serve her.  Pt stated that she has an appt scheduled with [facility] on May 13th and that [facility] stated for her to call on this Friday 10/25/16 to see if they can get her in earlier than the 5/13.  Pt asked what she should do if she has abdominal pain again and since she was banned from [facility].  I advised pt that unfortunately she can try to go an Urgent Care if needed.  I also advised that she can call our office for an annual at any point. Pt stated understanding with no further questions.

## 2016-10-24 ENCOUNTER — Telehealth: Payer: Self-pay | Admitting: Diagnostic Neuroimaging

## 2016-10-24 NOTE — Telephone Encounter (Signed)
I spoke to pt and she was seen for body aching, abd pain, was crying, mood changes.  Pt states that she was seen not necessarily for mood issues, but for facial numbness, abd pain. She does not have suicidal thoughts to herself or others,  She stated they think relayed to gall stones and has appt [facility] with [facility].  She has a lot going on.  She stated that she is not taking vimpat now and continues on keppra and topamax.  She has not heard from the pt assistance program.   I called Pt Assistance at [phone]  [facility] spoke to Clara.  Clarified address and phone # (which they had down wrong).  Pt is to call and confirm address.  They will send out after speaking with her.   Will take 7-10 days. I told her to call her mother to giver her an update.  She verbalized understanding of instructions.  She has appt set up on 12/03/16 1000 due to her medicaid approval. (she cancelled 10-18-16 appt) due to this.

## 2016-10-24 NOTE — Telephone Encounter (Signed)
Mother presented to the lobby today stating that the patient is having symptoms of crying, depression, body aches, mood swings x2 months. Patient has been to ER twice this week. Mother believes symptoms are medication related and wants a call back at [phone]

## 2016-10-24 NOTE — Telephone Encounter (Signed)
Spoke to mother of pt.   She is stating pt has been to ER several times recently for body aches, abd pain.  Has been crying, anger issues.  Pt taking topamax and keppra.  No vimpat.  Looks like approved for pt assistance and to be shipped to address.  ? Got it.  Will call pt find out.  Clarified address.

## 2016-10-24 NOTE — Telephone Encounter (Signed)
After being on vimpat x 2 weeks, then reduce keppra to  daily x 1 week, then stop keppra. -VRP

## 2016-10-25 ENCOUNTER — Encounter: Payer: Self-pay | Admitting: Family Medicine

## 2016-10-25 ENCOUNTER — Other Ambulatory Visit: Payer: Self-pay | Admitting: Family Medicine

## 2016-10-25 ENCOUNTER — Ambulatory Visit (INDEPENDENT_AMBULATORY_CARE_PROVIDER_SITE_OTHER): Payer: Self-pay | Admitting: Family Medicine

## 2016-10-25 ENCOUNTER — Encounter: Payer: Self-pay | Admitting: Gastroenterology

## 2016-10-25 VITALS — BP 130/66 | HR 116 | Temp 98.7°F | Resp 16 | Ht 68.0 in

## 2016-10-25 DIAGNOSIS — F32A Depression, unspecified: Secondary | ICD-10-CM

## 2016-10-25 DIAGNOSIS — R569 Unspecified convulsions: Secondary | ICD-10-CM

## 2016-10-25 DIAGNOSIS — R1084 Generalized abdominal pain: Secondary | ICD-10-CM

## 2016-10-25 DIAGNOSIS — F329 Major depressive disorder, single episode, unspecified: Secondary | ICD-10-CM

## 2016-10-25 DIAGNOSIS — Z23 Encounter for immunization: Secondary | ICD-10-CM

## 2016-10-25 LAB — CBC WITH DIFFERENTIAL/PLATELET
BASOS ABS: 40 {cells}/uL (ref 0–200)
Basophils Relative: 1 %
Eosinophils Absolute: 40 cells/uL (ref 15–500)
Eosinophils Relative: 1 %
HCT: 43.6 % (ref 35.0–45.0)
HEMOGLOBIN: 14.5 g/dL (ref 11.7–15.5)
LYMPHS ABS: 1640 {cells}/uL (ref 850–3900)
Lymphocytes Relative: 41 %
MCH: 34.1 pg — AB (ref 27.0–33.0)
MCHC: 33.3 g/dL (ref 32.0–36.0)
MCV: 102.6 fL — ABNORMAL HIGH (ref 80.0–100.0)
MONO ABS: 320 {cells}/uL (ref 200–950)
MPV: 8.4 fL (ref 7.5–12.5)
Monocytes Relative: 8 %
NEUTROS ABS: 1960 {cells}/uL (ref 1500–7800)
Neutrophils Relative %: 49 %
Platelets: 267 10*3/uL (ref 140–400)
RBC: 4.25 MIL/uL (ref 3.80–5.10)
RDW: 14 % (ref 11.0–15.0)
WBC: 4 10*3/uL (ref 3.8–10.8)

## 2016-10-25 LAB — COMPLETE METABOLIC PANEL WITH GFR
ALBUMIN: 4.6 g/dL (ref 3.6–5.1)
ALK PHOS: 71 U/L (ref 33–115)
ALT: 10 U/L (ref 6–29)
AST: 14 U/L (ref 10–30)
BILIRUBIN TOTAL: 0.4 mg/dL (ref 0.2–1.2)
BUN: 11 mg/dL (ref 7–25)
CO2: 20 mmol/L (ref 20–31)
Calcium: 9.7 mg/dL (ref 8.6–10.2)
Chloride: 108 mmol/L (ref 98–110)
Creat: 0.76 mg/dL (ref 0.50–1.10)
GFR, Est African American: >89 mL/min (ref >=60)
GFR, Est Non African American: >89 mL/min (ref >=60)
Glucose, Bld: 105 mg/dL — ABNORMAL HIGH (ref 65–99)
Potassium: 3.8 mmol/L (ref 3.5–5.3)
Sodium: 137 mmol/L (ref 135–146)
TOTAL PROTEIN: 7.2 g/dL (ref 6.1–8.1)

## 2016-10-25 LAB — POCT URINALYSIS DIP (DEVICE)
Glucose, UA: NEGATIVE mg/dL
HGB URINE DIPSTICK: NEGATIVE
Ketones, ur: 15 mg/dL — AB
LEUKOCYTES UA: NEGATIVE
NITRITE: NEGATIVE
PH: 5.5 (ref 5.0–8.0)
Protein, ur: 30 mg/dL — AB
SPECIFIC GRAVITY, URINE: 1.025 (ref 1.005–1.030)
Urobilinogen, UA: 0.2 mg/dL (ref 0.0–1.0)

## 2016-10-25 LAB — POCT URINE PREGNANCY: Preg Test, Ur: NEGATIVE

## 2016-10-25 MED ORDER — HYDROCORTISONE ACETATE 25 MG RE SUPP: 25.0000 mg | Freq: Two times a day (BID) | RECTAL | 0 refills | Status: DC

## 2016-10-25 MED ORDER — OMEPRAZOLE 20 MG PO CPDR: 20.0000 mg | DELAYED_RELEASE_CAPSULE | Freq: Every day | ORAL | 3 refills | Status: DC

## 2016-10-25 MED FILL — ?OMEPRAZOLE DR 20 MG CAPSUL: 20 | 30 days supply | Qty: 30 | Fill #0

## 2016-10-25 MED FILL — HYDROCORTISONE AC 25 MG SUP: 25 | 6 days supply | Qty: 12 | Fill #0

## 2016-10-25 NOTE — Patient Instructions (Addendum)
Start omeprazole 20 mg once daily regardless of symptoms for acid reflux type symptoms.  Take ibuprofen 400 mg every 8 hours with food only during the onset of period for menstrual periods.  I am referring you [facility].

## 2016-10-25 NOTE — Progress Notes (Signed)
Patient ID: Lisa Shaw, female    DOB: December 14, 1990, 26 y.o.   MRN: [id number]  PCP: Lisa Courts, FNP  Chief Complaint  Patient presents with  . Hospitalization Follow-up    ABDOMINAL PAIN/ POSSIBLE SICKLE CELL  . Medication Problem    KEPPRA    Subjective:  HPI  Lisa Shaw is a 26 y.o. female presents to establish care and evaluation of abdominal pain.  Medical problems include: Seizures and Depression  Lisa Shaw is followed by Lisa Shaw at [facility] for seizure management. She is currently prescribed Keppra, Topomax, and Vimpat for seizure management. Reports that she hasn't filled the prescription for Vimpat yet, although she should have medication today. Reports her last know seizure was in January. [name] reports that she has not driven since seizure. She feels as though she may experience some seizure activity during sleep. Reports awakens in the morning chewing on inside of her jaw. Last seizure was in January.  Continues to have episodes of urinary incontinents and alterations in mood. Grandfather is accompanying patient and reports that she "doesn't act like herself". She is very moody, depressed, and increased worrying about her health.  She reports ongoing abdominal pain which is she characterizes as sharp at times ranging from 6-7/10. Reports hard stools with streaks of blood. Unknown if she has hemorrhoids. She presented to Emergency Department  on 10/27/2016 with a complaint of abdominal pain. After a series of diagnostics test the patient was cleared of an acute abdomen and after a poor reaction toward the provider, she was advised not to return to the Emergency Department. Today she still complains of continuous right sided abdominal pain although she reports that the pain is occasionally on left side pain. Nausea consistently. Denies vomiting. Reports loss of appetite. Feels very dehydrated and fatigue. She reports that she  recently started back smoking and is now concerned that she may have Sickle Cell disease although neither of parents have sickle cell trait. Reports her next neurology appointment is May 22nd.    Social History   Social History  . Marital status: Single    Spouse name: N/A  . Number of children: 0  . Years of education: 12th   Occupational History  .      cleaning for [employer]   Social History Main Topics  . Smoking status: Current Every Day Smoker    Types: Cigarettes  . Smokeless tobacco: Never Used     Comment: 1/30 avg 2/day  . Alcohol use No  . Drug use: Yes    Types: Marijuana     Comment: 1/30 avg 1-2 x daily  . Sexual activity: Yes   Other Topics Concern  . Not on file   Social History Narrative   Patient lives at home with her mother.   Caffeine Use: sodas daily    Family History  Problem Relation Age of Onset  . Seizures Other    Review of Systems See HPI   Prior to Admission medications   Medication Sig Start Date End Date Taking? Authorizing Provider  ibuprofen (ADVIL,MOTRIN) 200 MG tablet Take 200 mg by mouth every 6 (six) hours as needed for headache or moderate pain.   Yes Historical Provider, MD  levETIRAcetam (KEPPRA) 250 MG tablet Take 1 tablet (250 mg total) by mouth 2 (two) times daily. 08/13/16  Yes Lisa Marker, MD  ondansetron (ZOFRAN) 4 MG tablet Take 1 tablet (4 mg total) by mouth every 6 (six) hours.  10/22/16  Yes Lisa Razor, MD  SUMAtriptan (IMITREX) 100 MG tablet Take 1 tablet (100 mg total) by mouth every 2 (two) hours as needed for migraine. May repeat in 2 hours if headache persists or recurs. 08/13/16  Yes Lisa Marker, MD  topiramate (TOPAMAX) 100 MG tablet Take 1 tablet (100 mg total) by mouth 2 (two) times daily. 08/13/16  Yes Lisa Marker, MD  naproxen sodium (ANAPROX) 220 MG tablet Take 220 mg by mouth 2 (two) times daily as needed (for pain).    Historical Provider, MD  oxyCODONE-acetaminophen  (PERCOCET/ROXICET) 5-325 MG tablet Take 1-2 tablets by mouth every 4 (four) hours as needed for severe pain. Patient not taking: Reported on 10/25/2016 10/22/16   Lisa Razor, MD    Past Medical, Surgical Family and Social History reviewed and updated.    Objective:   Today's Vitals   10/25/16 0811  BP: 130/66  Pulse: (!) 116  Resp: 16  Temp: 98.7 F (37.1 C)  TempSrc: Oral  SpO2: 100%  Height:  (1.727 m)    Wt Readings from Last 3 Encounters:  10/22/16 150 lb (68 kg)  10/22/16 143 lb (64.9 kg)  10/21/16 143 lb (64.9 kg)   Physical Exam  Constitutional: She is oriented to person, place, and time. She appears well-developed and well-nourished.  HENT:  Head: Normocephalic and atraumatic.  Mouth/Throat: Oropharynx is clear and moist.  Eyes: Conjunctivae and EOM are normal. Pupils are equal, round, and reactive to light.  Cardiovascular: Regular rhythm and intact distal pulses.   Pulmonary/Chest: Effort normal and breath sounds normal.  Abdominal: Soft. Bowel sounds are normal.  Musculoskeletal: Normal range of motion.  Neurological: She is alert and oriented to person, place, and time. She has normal strength. No sensory deficit. She displays a negative Romberg sign. GCS eye subscore is 4. GCS verbal subscore is 5. GCS motor subscore is 6.  Psychiatric: Judgment normal. Her mood appears anxious. Her affect is labile. Her speech is rapid and/or pressured. She is agitated and hyperactive.  Obsessive thoughts about symptoms and illness      Assessment & Plan:  1. Generalized abdominal pain 2. Seizures (HCC) 3. Depression, unspecified depression type 4. Need for diphtheria-tetanus-pertussis (Tdap) vaccine 5. Hemorroids   For abdominal pain, start a trial of omeprazole 20 mg daily. I am referring you to gastroenterology for further evaluation of continued abdominal pain. At the onset of menstrual period, take ibuprofen 400 mg every 8 hours for cramps. Continue to  follow-up with neurology. Depression and altered mood, I am referring you for further evaluation to behavioral health. Hydrocortisone 25 mg suppositories insert suppository rectally. two times daily   RTC: 3 months    Godfrey Pick. Tiburcio Pea, MSN, [facility] [address] [place], [place] [address] [phone]

## 2016-10-26 LAB — THYROID PANEL WITH TSH
Free Thyroxine Index: 2.1 (ref 1.4–3.8)
T3 Uptake: 30 % (ref 22–35)
T4, Total: 6.9 ug/dL (ref 4.5–12.0)
TSH: 1.51 mIU/L

## 2016-10-26 LAB — LIPASE: Lipase: 12 U/L (ref 7–60)

## 2016-10-26 LAB — AMYLASE: Amylase: 44 U/L (ref 0–105)

## 2016-10-27 ENCOUNTER — Encounter (HOSPITAL_COMMUNITY): Payer: Self-pay | Admitting: Emergency Medicine

## 2016-10-27 ENCOUNTER — Emergency Department (HOSPITAL_COMMUNITY)
Admission: EM | Admit: 2016-10-27 | Discharge: 2016-10-27 | Disposition: A | Discharge status: Home / Self Care | Payer: Medicaid Other | Attending: Emergency Medicine | Admitting: Emergency Medicine

## 2016-10-27 DIAGNOSIS — F17210 Nicotine dependence, cigarettes, uncomplicated: Secondary | ICD-10-CM | POA: Insufficient documentation

## 2016-10-27 DIAGNOSIS — Z79899 Other long term (current) drug therapy: Secondary | ICD-10-CM | POA: Insufficient documentation

## 2016-10-27 DIAGNOSIS — R1084 Generalized abdominal pain: Secondary | ICD-10-CM

## 2016-10-27 DIAGNOSIS — R110 Nausea: Secondary | ICD-10-CM | POA: Insufficient documentation

## 2016-10-27 LAB — COMPREHENSIVE METABOLIC PANEL
ALBUMIN: 4.1 g/dL (ref 3.5–5.0)
ALT: 13 U/L — AB (ref 14–54)
ANION GAP: 6 (ref 5–15)
AST: 17 U/L (ref 15–41)
Alkaline Phosphatase: 56 U/L (ref 38–126)
BILIRUBIN TOTAL: 0.3 mg/dL (ref 0.3–1.2)
BUN: 12 mg/dL (ref 6–20)
CALCIUM: 9.1 mg/dL (ref 8.9–10.3)
CO2: 18 mmol/L — ABNORMAL LOW (ref 22–32)
CREATININE: 0.66 mg/dL (ref 0.44–1.00)
Chloride: 114 mmol/L — ABNORMAL HIGH (ref 101–111)
GFR calc non Af Amer: >60 mL/min (ref >60)
GLUCOSE: 100 mg/dL — AB (ref 65–99)
Potassium: 3.5 mmol/L (ref 3.5–5.1)
SODIUM: 138 mmol/L (ref 135–145)
TOTAL PROTEIN: 6.6 g/dL (ref 6.5–8.1)

## 2016-10-27 LAB — RAPID URINE DRUG SCREEN, HOSP PERFORMED
AMPHETAMINES: NOT DETECTED
Barbiturates: POSITIVE — AB
Benzodiazepines: NOT DETECTED
COCAINE: NOT DETECTED
Opiates: NOT DETECTED
TETRAHYDROCANNABINOL: POSITIVE — AB

## 2016-10-27 LAB — URINALYSIS, ROUTINE W REFLEX MICROSCOPIC
Bilirubin Urine: NEGATIVE
GLUCOSE, UA: NEGATIVE mg/dL
Hgb urine dipstick: NEGATIVE
KETONES UR: NEGATIVE mg/dL
LEUKOCYTES UA: NEGATIVE
NITRITE: NEGATIVE
PH: 8 (ref 5.0–8.0)
PROTEIN: NEGATIVE mg/dL
Specific Gravity, Urine: 1.016 (ref 1.005–1.030)

## 2016-10-27 LAB — I-STAT BETA HCG BLOOD, ED (MC, WL, AP ONLY): I-stat hCG, quantitative: <5 m[IU]/mL (ref <5)

## 2016-10-27 LAB — CBC
HCT: 36.1 % (ref 36.0–46.0)
Hemoglobin: 12.5 g/dL (ref 12.0–15.0)
MCH: 33.8 pg (ref 26.0–34.0)
MCHC: 34.6 g/dL (ref 30.0–36.0)
MCV: 97.6 fL (ref 78.0–100.0)
PLATELETS: 236 10*3/uL (ref 150–400)
RBC: 3.7 MIL/uL — ABNORMAL LOW (ref 3.87–5.11)
RDW: 12.5 % (ref 11.5–15.5)
WBC: 6.5 10*3/uL (ref 4.0–10.5)

## 2016-10-27 LAB — LIPASE, BLOOD: LIPASE: 25 U/L (ref 11–51)

## 2016-10-27 MED ORDER — GI COCKTAIL ~~LOC~~
30.0000 mL | Freq: Once | ORAL | Status: AC
  Administered 2016-10-27: 30 mL via ORAL
  Filled 2016-10-27: qty 30

## 2016-10-27 MED ORDER — LEVETIRACETAM 250 MG PO TABS
250.0000 mg | ORAL_TABLET | Freq: Once | ORAL | Status: AC
  Administered 2016-10-27: 250 mg via ORAL
  Filled 2016-10-27: qty 1

## 2016-10-27 MED ORDER — ONDANSETRON 4 MG PO TBDP
4.0000 mg | ORAL_TABLET | Freq: Once | ORAL | Status: AC
  Administered 2016-10-27: 4 mg via ORAL
  Filled 2016-10-27: qty 1

## 2016-10-27 MED ORDER — TOPIRAMATE 100 MG PO TABS
100.0000 mg | ORAL_TABLET | Freq: Once | ORAL | Status: AC
  Administered 2016-10-27: 100 mg via ORAL
  Filled 2016-10-27: qty 1

## 2016-10-27 MED ORDER — DICYCLOMINE HCL 10 MG PO CAPS
10.0000 mg | ORAL_CAPSULE | Freq: Once | ORAL | Status: AC
  Administered 2016-10-27: 10 mg via ORAL
  Filled 2016-10-27: qty 1

## 2016-10-27 NOTE — Discharge Instructions (Signed)
Per your neurologist: After being on vimpat x 2 weeks, then reduce keppra to  daily x 1 week, then stop keppra  Follow up with gastroenterology as referred by primary care provider

## 2016-10-27 NOTE — ED Provider Notes (Signed)
[facility]-EMERGENCY DEPT Provider Note   CSN: [id number] Arrival date & time: 10/27/16  1191     History   Chief Complaint Chief Complaint  Patient presents with  . Abdominal Pain    HPI Lisa Shaw is a 26 y.o. female.  26yo F w/ PMH including epilepsy, migraines who p/w abdominal pain. One week ago, the patient began having crampy. No local abdominal pain. The following day it became more diffuse. Her pain has been persistent since it began, it is diffuse but worse on the right side. She reports subjective fevers and chills but has not measured her temperature. She has had nausea but no vomiting. She reports alternating diarrhea and constipation with some rectal pain during bowel movements. She denies any urinary symptoms, vaginal bleeding/discharge, chest pain, or shortness of breath. She was seen by her PCP this week and has been written prescriptions for medications that she has not yet started.  Of note, the patient has been seen in the ED 3 times in the past one week for the same symptoms. Previous CT of abdomen showed probable ruptured ovarian cyst and abdominal ultrasound showed gallstones without any evidence of cholecystitis or other acute problems.   The history is provided by the patient.  Abdominal Pain      Past Medical History:  Diagnosis Date  . Epilepsy (HCC)   . Migraine   . Seizures (HCC) 07/18/2016    Patient Active Problem List   Diagnosis Date Noted  . Seizures (HCC) 10/25/2016    History reviewed. No pertinent surgical history.  OB History    No data available       Home Medications    Prior to Admission medications   Medication Sig Start Date End Date Taking? Authorizing Provider  hydrocortisone (ANUSOL-HC) 25 MG suppository Place 1 suppository (25 mg total) rectally 2 (two) times daily. 10/25/16  Yes Doyle Askew, FNP  ibuprofen (ADVIL,MOTRIN) 200 MG tablet Take 200 mg by mouth every 6 (six) hours as needed for headache or  moderate pain.   Yes Historical Provider, MD  naproxen sodium (ANAPROX) 220 MG tablet Take 220 mg by mouth 2 (two) times daily as needed (for pain).   Yes Historical Provider, MD  omeprazole (PRILOSEC) 20 MG capsule Take 1 capsule (20 mg total) by mouth daily. 10/25/16  Yes Doyle Askew, FNP  ondansetron (ZOFRAN) 4 MG tablet Take 1 tablet (4 mg total) by mouth every 6 (six) hours. 10/22/16  Yes Raeford Razor, MD  SUMAtriptan (IMITREX) 100 MG tablet Take 1 tablet (100 mg total) by mouth every 2 (two) hours as needed for migraine. May repeat in 2 hours if headache persists or recurs. 08/13/16  Yes Suanne Marker, MD  topiramate (TOPAMAX) 100 MG tablet Take 1 tablet (100 mg total) by mouth 2 (two) times daily. 08/13/16  Yes Suanne Marker, MD  levETIRAcetam (KEPPRA) 250 MG tablet Take 1 tablet (250 mg total) by mouth 2 (two) times daily. Patient not taking: Reported on 10/27/2016 08/13/16   Suanne Marker, MD  oxyCODONE-acetaminophen (PERCOCET/ROXICET) 5-325 MG tablet Take 1-2 tablets by mouth every 4 (four) hours as needed for severe pain. Patient not taking: Reported on 10/25/2016 10/22/16   Raeford Razor, MD    Family History Family History  Problem Relation Age of Onset  . Seizures Other     Social History Social History  Substance Use Topics  . Smoking status: Current Every Day Smoker    Types: Cigarettes  . Smokeless  tobacco: Never Used     Comment: 1/30 avg 2/day  . Alcohol use No     Allergies   Keppra [levetiracetam]   Review of Systems Review of Systems  Gastrointestinal: Positive for abdominal pain.  All other systems reviewed and are negative.    Physical Exam Updated Vital Signs BP 110/70 (BP Location: Right Arm)   Pulse 91   Temp 98.2 F (36.8 C) (Oral)   Resp 14   LMP 10/03/2016 Comment: negative HCG blood test 10-21-2016  SpO2 100%   Physical Exam  Constitutional: She is oriented to person, place, and time. She appears well-developed and  well-nourished. No distress.  uncomfortable  HENT:  Head: Normocephalic and atraumatic.  Mouth/Throat: Oropharynx is clear and moist.  Moist mucous membranes  Eyes: Conjunctivae are normal. Pupils are equal, round, and reactive to light.  Neck: Neck supple.  Cardiovascular: Normal rate, regular rhythm and normal heart sounds.   No murmur heard. Pulmonary/Chest: Effort normal and breath sounds normal.  Abdominal: Soft. Bowel sounds are normal. She exhibits no distension. There is tenderness. There is no rebound and no guarding.  Generalized abdominal tenderness without peritonitis  Musculoskeletal: She exhibits no edema.  Neurological: She is alert and oriented to person, place, and time.  Fluent speech  Skin: Skin is warm and dry.  Psychiatric: Judgment normal.  anxious  Nursing note and vitals reviewed.    ED Treatments / Results  Labs (all labs ordered are listed, but only abnormal results are displayed) Labs Reviewed  COMPREHENSIVE METABOLIC PANEL - Abnormal; Notable for the following:       Result Value   Chloride 114 (*)    CO2 18 (*)    Glucose, Bld 100 (*)    ALT 13 (*)    All other components within normal limits  CBC - Abnormal; Notable for the following:    RBC 3.70 (*)    All other components within normal limits  URINALYSIS, ROUTINE W REFLEX MICROSCOPIC - Abnormal; Notable for the following:    APPearance CLOUDY (*)    Bacteria, UA RARE (*)    Squamous Epithelial / LPF 0-5 (*)    All other components within normal limits  RAPID URINE DRUG SCREEN, HOSP PERFORMED - Abnormal; Notable for the following:    Tetrahydrocannabinol POSITIVE (*)    Barbiturates POSITIVE (*)    All other components within normal limits  LIPASE, BLOOD  I-STAT BETA HCG BLOOD, ED ([facility], [facility], [facility] ONLY)    EKG  EKG Interpretation None       Radiology No results found.  Procedures Procedures (including critical care time)  Medications Ordered in ED Medications  dicyclomine  (BENTYL) capsule 10 mg (10 mg Oral Given 10/27/16 2000)  gi cocktail (Maalox,Lidocaine,Donnatal) (30 mLs Oral Given 10/27/16 2000)  ondansetron (ZOFRAN-ODT) disintegrating tablet 4 mg (4 mg Oral Given 10/27/16 2000)  topiramate (TOPAMAX) tablet 100 mg (100 mg Oral Given 10/27/16 2240)  levETIRAcetam (KEPPRA) tablet 250 mg (250 mg Oral Given 10/27/16 2240)     Initial Impression / Assessment and Plan / ED Course  I have reviewed the triage vital signs and the nursing notes.  Pertinent labs that were available during my care of the patient were reviewed by me and considered in my medical decision making (see chart for details).     Pt w/ 1 week of crampy, generalized abd pain associated w/ nausea, Alternating diarrhea and constipation. PCP note states that she has been referred to gastroenterology. She was nontoxic  on exam with normal vital signs. Her abdomen was soft and not distended, normal bowel sounds. She had generalized abdominal tenderness without peritonitis. Obtained above lab work.  I reviewed recent imaging which showed CT documenting probable ovarian cyst rupture and ultrasound was single gallstone, no evidence of inflammatory changes or other concerning findings.   Lab work today shows normal creatinine, normal LFTs and lipase, unremarkable CBC, UA without evidence of dehydration or infection. The patient has had GI cocktail, Bentyl here with no vomiting in the ED. She requested her nighttime seizure medications which I provided. Her PCP note from this week states that she has been referred to gastroenterology night and besides the importance of this follow-up as she may require scope in the future. Instructed to continue PPI. Patient discharged in satisfactory condition.  Final Clinical Impressions(s) / ED Diagnoses   Final diagnoses:  Generalized abdominal pain  Nausea    New Prescriptions New Prescriptions   No medications on file     Laurence Spates, MD 10/27/16  2242

## 2016-10-27 NOTE — ED Notes (Signed)
Patient

## 2016-10-27 NOTE — ED Triage Notes (Signed)
Per EMS, patient from home, c/o right sided abdominal pain with nausea x1 week. Seen at [facility] for same x5 days ago. Denies chest pain and SOB. Ambulatory with EMS.

## 2016-10-28 ENCOUNTER — Emergency Department (HOSPITAL_COMMUNITY)
Admission: EM | Admit: 2016-10-28 | Discharge: 2016-10-28 | Disposition: A | Discharge status: Home / Self Care | Payer: Medicaid Other | Attending: Emergency Medicine | Admitting: Emergency Medicine

## 2016-10-28 ENCOUNTER — Encounter (HOSPITAL_COMMUNITY): Payer: Self-pay | Admitting: Nurse Practitioner

## 2016-10-28 DIAGNOSIS — Z79899 Other long term (current) drug therapy: Secondary | ICD-10-CM | POA: Insufficient documentation

## 2016-10-28 DIAGNOSIS — G40909 Epilepsy, unspecified, not intractable, without status epilepticus: Secondary | ICD-10-CM

## 2016-10-28 DIAGNOSIS — F17210 Nicotine dependence, cigarettes, uncomplicated: Secondary | ICD-10-CM | POA: Insufficient documentation

## 2016-10-28 MED ORDER — DIAZEPAM 5 MG PO TABS
5.0000 mg | ORAL_TABLET | Freq: Once | ORAL | Status: AC
  Administered 2016-10-28: 5 mg via ORAL
  Filled 2016-10-28: qty 1

## 2016-10-28 MED ORDER — LORAZEPAM 2 MG/ML IJ SOLN
1.0000 mg | Freq: Once | INTRAMUSCULAR | Status: AC
  Administered 2016-10-28: 1 mg via INTRAVENOUS
  Filled 2016-10-28: qty 1

## 2016-10-28 NOTE — ED Notes (Signed)
Patient is alert and oriented x3.  She was given DC instructions and follow up visit instructions.  Patient gave verbal understanding. She was DC ambulatory under her own power to home.  V/S stable.  He was not showing any signs of distress on DC 

## 2016-10-28 NOTE — ED Triage Notes (Signed)
Pt brought in via EMS. Per EMS she was brough from home with complaints of seizure like activity. Ems stated there is no power in the home currently. Per the mother patient was seen having mild like shaking "seizure activity" and informed that she had a seizure yesterday. Patient was A&O x4 on ems assessment with not post-ictal indications. She was seen yesterday for same. EMS stated patient was able talk and states "she may have had a seizure". Patient was talkative and neuro status was unremarkable in route. Pt did complain of a headache that she had since yesterday. She was NSR, VSS 124/78, 99% RA, CBG 128, HR 87. Per ems patient stated she took seizure medications prior to leaving ED on yesterday.

## 2016-10-28 NOTE — ED Notes (Signed)
Bed: WU98 Expected date:  Expected time:  Means of arrival:  Comments: 26 yo F/Seizure

## 2016-10-28 NOTE — ED Provider Notes (Signed)
[facility] DEPT Provider Note   CSN: [id number] Arrival date & time: 10/28/16  0206  By signing my name below, I, Lisa Shaw, attest that this documentation has been prepared under the direction and in the presence of Derwood Kaplan, MD. Electronically Signed: Elder Shaw, Scribe. 10/28/16. 3:40 AM.   History   Chief Complaint Chief Complaint  Patient presents with  . Seizures    HPI Lisa Shaw is a 26 y.o. female with history of seizures on Keppra who presents to the ED for evaluation of a possible witnessed seizure. This patient states that she was in bed when she "rolled over and felt her legs shaking, L wrist numb, and palpitations". She then started "twitching" in all four extremities. She was conscious at this time. The mother witnessed. She states that the patient was responding to her name and she was talking during the episode. The mother states "this time was very mild" and "she is not as confused as she normally is". The patient denies incontinence; or tongue biting. She states that she experiences seizures "several times a week". She is taking her Keppra as prescribed; however states that if she does not take Keppra she is confident to experience a seizure that day. Pt also reports being started on vimpat. Pt denies drug use. The history is provided by the patient. No language interpreter was used.  Seizures   This is a recurrent problem. The current episode started 1 to 2 hours ago. The problem has been resolved. There was 1 seizure. The most recent episode lasted 30 to 120 seconds.    Past Medical History:  Diagnosis Date  . Epilepsy (HCC)   . Migraine   . Seizures (HCC) 07/18/2016    Patient Active Problem List   Diagnosis Date Noted  . Seizures (HCC) 10/25/2016    History reviewed. No pertinent surgical history.  OB History    No data available       Home Medications    Prior to Admission medications   Medication Sig Start Date End  Date Taking? Authorizing Provider  ibuprofen (ADVIL,MOTRIN) 200 MG tablet Take 200 mg by mouth every 6 (six) hours as needed for headache or moderate pain.   Yes Historical Provider, MD  naproxen sodium (ANAPROX) 220 MG tablet Take 220 mg by mouth 2 (two) times daily as needed (for pain).   Yes Historical Provider, MD  omeprazole (PRILOSEC) 20 MG capsule Take 1 capsule (20 mg total) by mouth daily. Patient taking differently: Take 20 mg by mouth daily as needed (indigestion).  10/25/16  Yes Doyle Askew, FNP  ondansetron (ZOFRAN) 4 MG tablet Take 1 tablet (4 mg total) by mouth every 6 (six) hours. 10/22/16  Yes Raeford Razor, MD  SUMAtriptan (IMITREX) 100 MG tablet Take 1 tablet (100 mg total) by mouth every 2 (two) hours as needed for migraine. May repeat in 2 hours if headache persists or recurs. 08/13/16  Yes Suanne Marker, MD  topiramate (TOPAMAX) 100 MG tablet Take 1 tablet (100 mg total) by mouth 2 (two) times daily. 08/13/16  Yes Suanne Marker, MD  hydrocortisone (ANUSOL-HC) 25 MG suppository Place 1 suppository (25 mg total) rectally 2 (two) times daily. Patient not taking: Reported on 10/28/2016 10/25/16   Doyle Askew, FNP  levETIRAcetam (KEPPRA) 250 MG tablet Take 1 tablet (250 mg total) by mouth 2 (two) times daily. Patient not taking: Reported on 10/27/2016 08/13/16   Suanne Marker, MD  oxyCODONE-acetaminophen (PERCOCET/ROXICET) 5-325 MG tablet  Take 1-2 tablets by mouth every 4 (four) hours as needed for severe pain. Patient not taking: Reported on 10/25/2016 10/22/16   Raeford Razor, MD    Family History Family History  Problem Relation Age of Onset  . Seizures Other     Social History Social History  Substance Use Topics  . Smoking status: Current Every Day Smoker    Types: Cigarettes  . Smokeless tobacco: Never Used     Comment: 1/30 avg 2/day  . Alcohol use No     Allergies   Keppra [levetiracetam]   Review of Systems Review of  Systems  Neurological: Positive for seizures.       No incontinence.   All other systems reviewed and are negative.    Physical Exam Updated Vital Signs BP (!) 111/55   Pulse 83   Temp 98.5 F (36.9 C) (Oral)   Resp 18   Ht  (1.727 m)   Wt 150 lb (68 kg)   LMP 10/03/2016 Comment: negative HCG blood test 10-21-2016  SpO2 100%   BMI 22.81 kg/m   Physical Exam  Constitutional: She is oriented to person, place, and time. She appears well-developed and well-nourished.  HENT:  Head: Normocephalic and atraumatic.  Cardiovascular: Normal rate.   Pulmonary/Chest: Effort normal.  Neurological: She is alert and oriented to person, place, and time.  CN II - XII except for subjective numbness to both sides of the face.   Skin: Skin is warm and dry.  Psychiatric: She has a normal mood and affect.  Nursing note and vitals reviewed.    ED Treatments / Results  Labs (all labs ordered are listed, but only abnormal results are displayed) Labs Reviewed - No data to display  EKG  EKG Interpretation None       Radiology No results found.  Procedures Procedures (including critical care time)  Medications Ordered in ED Medications  diazepam (VALIUM) tablet 5 mg (not administered)  LORazepam (ATIVAN) injection 1 mg (1 mg Intravenous Given 10/28/16 0426)     Initial Impression / Assessment and Plan / ED Course  I have reviewed the triage vital signs and the nursing notes.  Pertinent labs & imaging results that were available during my care of the patient were reviewed by me and considered in my medical decision making (see chart for details).  Clinical Course as of Oct 29 719  Mon Oct 28, 2016  0703 PT REASSESSED. No seizure activity here. Pt has complex seizure hx, and is on multiple meds. Her seizure like episode was atypical - as there was no LOC, no incontinence, and no true post ictal confusion. Pt was also responding to mother while "shaking." Simple partial  seizure is possible - and after discussing my concerns with the patient and her mother, we have decided that they will call the Neurologist to see if any med adjustment or quick clinic f/u is required.  [AN]    Clinical Course User Index [AN] Derwood Kaplan, MD    Pt was seen in the ER for a different complain earlier - and her UDS was + for marijuana.  Pt comes in with cc of seizure. Pt was seen in the ER for a different complain earlier - and her UDS was + for marijuana. Pt reports that she has been taking her meds as prescribed - but there has been med adjustment being done. Also, it appears that the seizures are not well controlled yet.  Her episode was maybe simple partial  seizure type - as pt was responsive during the episode. However, she was having shaking over her entire body, which would usually signify generalized tonic clonic seizure.  We will monitor closely for now.   Final Clinical Impressions(s) / ED Diagnoses   Final diagnoses:  Seizure disorder [facility])    New Prescriptions New Prescriptions   No medications on file  I personally performed the services described in this documentation, which was scribed in my presence. The recorded information has been reviewed and is accurate.     Derwood Kaplan, MD 10/28/16 [phone]

## 2016-10-28 NOTE — Discharge Instructions (Signed)
Please call the Neurologist for prompt follow up and to see if medication adjustment is needed.  Please keep checking on Ms. Lisa Shaw. Return to the ER if there is a repeat seizure.

## 2016-10-28 NOTE — ED Notes (Signed)
Patient given water

## 2016-10-29 ENCOUNTER — Telehealth: Payer: Self-pay

## 2016-10-29 DIAGNOSIS — R109 Unspecified abdominal pain: Secondary | ICD-10-CM | POA: Insufficient documentation

## 2016-10-29 NOTE — Telephone Encounter (Signed)
I called pt.  She received the vimpat yesterday.  She has not started this yet.   She stated she had sz (? Panic attack, palpitations, leg shakiness). on Monday night.  Went to [facility] ED.  Since being in ED has headache in back of her head.  See's pcp in am.  I instructed her on the regimen of starting vimpat  po bid for 2 wks, the decrease keppra to  po daily for one week, then stop.  She verbalized understanding.     Pt has been taking her medications as ordered.  Has appt 12-03-16 RV.   FYI.

## 2016-10-29 NOTE — Telephone Encounter (Signed)
Agree with plan to start vimpat. -VRP

## 2016-10-30 ENCOUNTER — Telehealth: Payer: Self-pay | Admitting: Diagnostic Neuroimaging

## 2016-10-30 ENCOUNTER — Encounter: Payer: Self-pay | Admitting: Family Medicine

## 2016-10-30 ENCOUNTER — Ambulatory Visit (INDEPENDENT_AMBULATORY_CARE_PROVIDER_SITE_OTHER): Payer: Self-pay | Admitting: Family Medicine

## 2016-10-30 VITALS — BP 135/66 | HR 85 | Temp 98.8°F | Resp 16 | Ht 68.0 in | Wt 135.0 lb

## 2016-10-30 DIAGNOSIS — L299 Pruritus, unspecified: Secondary | ICD-10-CM

## 2016-10-30 DIAGNOSIS — R569 Unspecified convulsions: Secondary | ICD-10-CM

## 2016-10-30 DIAGNOSIS — R109 Unspecified abdominal pain: Secondary | ICD-10-CM

## 2016-10-30 LAB — POCT URINALYSIS DIP (DEVICE)
Bilirubin Urine: NEGATIVE
Glucose, UA: NEGATIVE mg/dL
HGB URINE DIPSTICK: NEGATIVE
Ketones, ur: NEGATIVE mg/dL
LEUKOCYTES UA: NEGATIVE
NITRITE: NEGATIVE
PH: 7 (ref 5.0–8.0)
Protein, ur: NEGATIVE mg/dL
Specific Gravity, Urine: 1.01 (ref 1.005–1.030)
UROBILINOGEN UA: 0.2 mg/dL (ref 0.0–1.0)

## 2016-10-30 MED ORDER — CARBAMIDE PEROXIDE 6.5 % OT SOLN: 5.0000 [drp] | Freq: Two times a day (BID) | OTIC | 0 refills | Status: DC | PRN

## 2016-10-30 MED FILL — EARWAX TREATMENT 6.5% DROPS: 6.5 | 14 days supply | Qty: 15 | Fill #0

## 2016-10-30 NOTE — Patient Instructions (Addendum)
For acne you may apply benzoyl peroxide gel to directly to areas of acne.  Use Debrox ear drops for ear itching.  Follow-up with neurology and keep scheduled follow-up with gastroenterology.   Return to the ED if any other seizure activity occurs.

## 2016-10-30 NOTE — Telephone Encounter (Signed)
Patient had a seizure this past Monday and has an appointment with Dr. Marjory Lies on 12-03-16 but she needs to be seen sooner.

## 2016-10-30 NOTE — Telephone Encounter (Signed)
Spoke to pt and will come in (6 wks f/u) appt 11-04-16 at 1000.   Pt appreciated call back.

## 2016-10-30 NOTE — Progress Notes (Signed)
Patient ID: LAQUEENA HINCHEY, female    DOB: 27-Feb-1991, 26 y.o.   MRN: [id number]  PCP: Joaquin Courts, FNP  Chief Complaint  Patient presents with  . Hospitalization Follow-up  . Abdominal Pain    Subjective:  HPI DELISA FINCK is a 26 y.o. female presents for evaluation of abdominal pain and hospital follow-up. Medical problems include: Seizures On 10/27/2016, she reports experiencing abdominal pain and within moments she started shaking. Her mother called EMS and she was transported to the emergency department . While there she was given a GI cocktail for abdominal pain. No further seizure activity witnessed and she was discharged home. She returned home and later in the night reports that her mother called EMS again as she began experiencing jerking motions, experienced a warm sensation over her face,  and she began to shake uncontrollably, and experience body numbness. Reports having bite marks all over her tongue from seizure.Generalized warmth. Once admitted to the emergency department, she was administered Lorazepam. No additional seizure activity occurred while at the ED and she was discharged home. Since her discharged from the hospital on 10/28/16, no additional siezure activity occurred.  Her mother is accompanying her today at this office and reports that Trella has a an attorney and is working to obtain disability and medicaid. Reports that she is unable to afford to attend her specialist visits due to the co-pays. She is still complaining of abdominal pain today and hasn't started omeprazole as she has been unable to pick-up the prescription. She is taking Topamax and Keppra. She has Vimpat although hasn't started medication as of yet. Taking Tylenol for headaches constantly. Unable to tolerated Imitrex as this makes her nausea worst. Denies vision disturbance  Reports generalized weakness even with walking.   Social History   Social History  . Marital status:  Single    Spouse name: N/A  . Number of children: 0  . Years of education: 12th   Occupational History  .      cleaning for [employer]   Social History Main Topics  . Smoking status: Current Every Day Smoker    Types: Cigarettes  . Smokeless tobacco: Never Used     Comment: 1/30 avg 2/day  . Alcohol use No  . Drug use: Yes    Types: Marijuana     Comment: 1/30 avg 1-2 x daily  . Sexual activity: Yes   Other Topics Concern  . Not on file   Social History Narrative   Patient lives at home with her mother.   Caffeine Use: sodas daily    Family History  Problem Relation Age of Onset  . Seizures Other    Review of Systems  See HPI  Patient Active Problem List   Diagnosis Date Noted  . Abdominal pain 10/29/2016  . Seizures (HCC) 10/25/2016    Allergies  Allergen Reactions  . Keppra [Levetiracetam] Other (See Comments)    Reaction:  Headaches     Prior to Admission medications   Medication Sig Start Date End Date Taking? Authorizing Provider  hydrocortisone (ANUSOL-HC) 25 MG suppository Place 1 suppository (25 mg total) rectally 2 (two) times daily. 10/25/16  Yes Doyle Askew, FNP  ibuprofen (ADVIL,MOTRIN) 200 MG tablet Take 200 mg by mouth every 6 (six) hours as needed for headache or moderate pain.   Yes Historical Provider, MD  levETIRAcetam (KEPPRA) 250 MG tablet Take 1 tablet (250 mg total) by mouth 2 (two) times daily. 08/13/16  Yes  Suanne Marker, MD  naproxen sodium (ANAPROX) 220 MG tablet Take 220 mg by mouth 2 (two) times daily as needed (for pain).   Yes Historical Provider, MD  omeprazole (PRILOSEC) 20 MG capsule Take 1 capsule (20 mg total) by mouth daily. Patient taking differently: Take 20 mg by mouth daily as needed (indigestion).  10/25/16  Yes Doyle Askew, FNP  ondansetron (ZOFRAN) 4 MG tablet Take 1 tablet (4 mg total) by mouth every 6 (six) hours. 10/22/16  Yes Raeford Razor, MD  oxyCODONE-acetaminophen  (PERCOCET/ROXICET) 5-325 MG tablet Take 1-2 tablets by mouth every 4 (four) hours as needed for severe pain. 10/22/16  Yes Raeford Razor, MD  SUMAtriptan (IMITREX) 100 MG tablet Take 1 tablet (100 mg total) by mouth every 2 (two) hours as needed for migraine. May repeat in 2 hours if headache persists or recurs. 08/13/16  Yes Suanne Marker, MD  topiramate (TOPAMAX) 100 MG tablet Take 1 tablet (100 mg total) by mouth 2 (two) times daily. 08/13/16  Yes Suanne Marker, MD    Past Medical, Surgical Family and Social History reviewed and updated.    Objective:   Today's Vitals   10/30/16 0823  BP: 135/66  Pulse: 85  Resp: 16  Temp: 98.8 F (37.1 C)  TempSrc: Oral  SpO2: 100%  Weight: 135 lb (61.2 kg)  Height:  (1.727 m)    Wt Readings from Last 3 Encounters:  10/30/16 135 lb (61.2 kg)  10/28/16 150 lb (68 kg)  10/22/16 150 lb (68 kg)   Physical Exam  Constitutional: She is oriented to person, place, and time. She appears well-developed and well-nourished.  Cardiovascular: Normal rate, regular rhythm, normal heart sounds and intact distal pulses.   Pulmonary/Chest: Effort normal and breath sounds normal.  Abdominal: Soft. Bowel sounds are normal. She exhibits no distension and no mass. There is no tenderness. There is no rebound and no guarding.  Musculoskeletal: Normal range of motion.  Neurological: She is alert and oriented to person, place, and time.  Flaccid body movements during exam Weakness bilateral hand grips Intact sensation  Although strength regained when asked to stand up to walk out of room.   Psychiatric: She has a normal mood and affect. Her behavior is normal. Judgment and thought content normal.      Assessment & Plan:  1. Abdominal pain, unspecified abdominal location -Start Omeprazole -Follow-up 11/26/2016 appointment scheduled with [facility]  2. Seizure (HCC) -Contact Dr. Marjory Lies office for follow-up 11/04/16  3. Itching of  ear -Carbamide peroxide ear drops   Godfrey Pick. Tiburcio Pea, MSN, [facility] [address] [place], [place] [address] [phone]

## 2016-10-30 NOTE — Telephone Encounter (Signed)
Pt called back, she is wanting to be seen asap

## 2016-10-31 ENCOUNTER — Telehealth: Payer: Self-pay

## 2016-11-01 MED FILL — ?LEVETIRACETAM 500 MG TABLE: 500 | 30 days supply | Qty: 30 | Fill #0

## 2016-11-01 MED FILL — TOPIRAMATE 100 MG TABLET: 100 | 30 days supply | Qty: 60 | Fill #0

## 2016-11-01 NOTE — Telephone Encounter (Signed)
Spoke with the patient and advise her to take Ibuprofen  over the counter and drink plenty of water.

## 2016-11-04 ENCOUNTER — Encounter: Payer: Self-pay | Admitting: Diagnostic Neuroimaging

## 2016-11-04 ENCOUNTER — Ambulatory Visit (INDEPENDENT_AMBULATORY_CARE_PROVIDER_SITE_OTHER): Payer: Self-pay | Admitting: Diagnostic Neuroimaging

## 2016-11-04 ENCOUNTER — Telehealth: Payer: Self-pay

## 2016-11-04 VITALS — BP 124/83 | HR 103 | Ht 68.0 in | Wt 136.2 lb

## 2016-11-04 DIAGNOSIS — G43009 Migraine without aura, not intractable, without status migrainosus: Secondary | ICD-10-CM

## 2016-11-04 DIAGNOSIS — G40909 Epilepsy, unspecified, not intractable, without status epilepticus: Secondary | ICD-10-CM

## 2016-11-04 NOTE — Progress Notes (Signed)
[facility]  PATIENT: Lisa Shaw DOB: 06/09/1991  REFERRING CLINICIAN: Lowella Shaw HISTORY FROM: patient and mother  REASON FOR VISIT: follow up   HISTORICAL  CHIEF COMPLAINT:  Chief Complaint  Patient presents with  . Follow-up  . Seizures    in ED 10-28-16 for sz.  Seeing GI for abd pain ?gallstones    HISTORY OF PRESENT ILLNESS:   UPDATE 11/04/16: Since last visit, has had multiple visits to ER for abd pain, possibly ovarian cyst rupture. Also had seizure on 10/28/16, went to ER. Now finally on vimpat since 11/02/16. Struggling with stress, sleep issues, diff obtaining financial assistance.   NEW HPI (08/13/16, VRP): 26 year old female here for evaluation of seizures. Patient had first onset of seizure in 2013. I saw patient in 2014 (see note below). Apparently she was having intermittent staring spells for a few months and then had a generalized convulsive seizure. Initially she was started on Keppra but this caused mood and agitation problems. At some point this was switched to topiramate. Patient continues to have generalized convulsive seizures, approximate 5 times per year. Convulsive seizures associated with tongue biting and incontinence. Also having intermittent staring spells twice per day. [facility]. Patient also has intermittent headaches, bilateral sharp sensations without associated symptoms.  PRIOR HPI (09/09/14, Lisa Kinsman, MD; ZOX): "Seizures: Dec 2013 was first one. She has had a total of 5-6. Last was May 09, 2013. Referred from [facility] for evaluation of spells concerning for seizures. Seizures are described as tonic posturing in the UE and LE with rthymic diffuse jerking with eye rolling backwards. Left frontal sharps on EEG from 06/2012. Prior to this in 2012, she started having starring episodes (lasting ~2 min) associated with diaphoresis with post-ictal confusion with  violence. These only occur at night or early in the morning. There was evidence of tongue biting. No shaking or jerks noted interictally. During the episode the patient was responsive/not responsive. The patient has had urinary incontinence. No ETOH use. She now gets about 6-7 hours of sleep a night (previously while working she got 4-5 hours. No increased levels of stress. No illegal drug use. Has issues with deja vu, and possibly tingling while she has the staring episodes or before the large ones. The patient is currently on Keppra 1000 bid (increased Sept 2014) and has had irritation and grumpiness. No history of TBIs/meningitis, encephalitis/personal history of MR (completed 12 th grade on time), febrile seizures. The patient has had an MRI of her brain (reviewed report and this showed motion artifact but no large area of abnormality). The patient has had an EEG (showing left frontal sharps).The patient does not drive. She had been unable to keep a job 2/2 seizures and medication side effects. Per patient, no history of psychiatric issues. Family history of seizures in maternal grandmother (all her life). On Keppra, violent with mood disturbances so this was discontinued in 2015 and she was started on Topamax which was initially not tolerated 2/2 mental slowing but she now tolerates this and is on Topamax 100 mg qhs. Headaches: Has had headaches since 2012. Headaches b/l, frontal, and sharp. Has to take Advil or BC. Has these 50-70% of month. Not related to menses. Not associated with weather changes or foods or deprivation. Has diplopia with the headaches. Topamax caused medication side effects of mental slowing even at low doses which did not improve with time."  PRIOR HPI (09/23/12, VRP): 26 year old right-handed  female here for evaluation of seizure. 06/19/2012, patient was asleep, when all of a sudden she had convulsions, tongue biting and incontinence. Convulsions lasted for 2 minutes and were witnessed  by a friend. Paramedics were called to the home. Patient was confused and combative after the seizure. She is amnestic to events before and after the seizure. Patient went to the hospital, had MRI, EEG, which showed some left frontal output from activity, and was discharged on levetiracetam. Patient ran out of her medications in early January 2014. 07/20/2012, patient had a second seizure, at night while asleep. This was unwitnessed the patient woke up feeling funny and with urinary incontinence. Patient went to the hospital she was given a prescription for antiseizure medication. Since that time patient had no further seizures. She has noted some mood irritability, dizziness and insomnia since starting levetiracetam. Patient was born full-term. Patient's mother was hospitalized last month of pregnancy due to preterm labor. Patient developed normally. No history of head trauma, meningitis or encephalitis. Only family history seizure is on patient's great grandmother on mother's side.    REVIEW OF SYSTEMS: Full 14 system review of systems performed and negative with exception of: memory loss dizziness depression anxiety hallucinations hyperactive excess thirst chills fatigue fever activity change facial drooping chest pain shortness of breath drooling pain cramps neck pain double vision.   ALLERGIES: Allergies  Allergen Reactions  . Keppra [Levetiracetam] Other (See Comments)    Reaction:  Headaches     HOME MEDICATIONS: Outpatient Medications Prior to Visit  Medication Sig Dispense Refill  . carbamide peroxide (DEBROX) 6.5 % otic solution Place 5 drops into both ears 2 (two) times daily as needed. 15 mL 0  . hydrocortisone (ANUSOL-HC) 25 MG suppository Place 1 suppository (25 mg total) rectally 2 (two) times daily. 12 suppository 0  . levETIRAcetam (KEPPRA) 250 MG tablet Take 1 tablet (250 mg total) by mouth 2 (two) times daily. 60 tablet 12  . omeprazole (PRILOSEC) 20 MG capsule Take 1 capsule  (20 mg total) by mouth daily. (Patient taking differently: Take 20 mg by mouth daily as needed (indigestion). ) 30 capsule 3  . ondansetron (ZOFRAN) 4 MG tablet Take 1 tablet (4 mg total) by mouth every 6 (six) hours. 12 tablet 0  . SUMAtriptan (IMITREX) 100 MG tablet Take 1 tablet (100 mg total) by mouth every 2 (two) hours as needed for migraine. May repeat in 2 hours if headache persists or recurs. 10 tablet 1  . topiramate (TOPAMAX) 100 MG tablet Take 1 tablet (100 mg total) by mouth 2 (two) times daily. 60 tablet 12  . ibuprofen (ADVIL,MOTRIN) 200 MG tablet Take 200 mg by mouth every 6 (six) hours as needed for headache or moderate pain.    . naproxen sodium (ANAPROX) 220 MG tablet Take 220 mg by mouth 2 (two) times daily as needed (for pain).    Marland Kitchen oxyCODONE-acetaminophen (PERCOCET/ROXICET) 5-325 MG tablet Take 1-2 tablets by mouth every 4 (four) hours as needed for severe pain. (Patient not taking: Reported on 11/04/2016) 20 tablet 0   No facility-administered medications prior to visit.     PAST MEDICAL HISTORY: Past Medical History:  Diagnosis Date  . Epilepsy (HCC)   . Migraine   . Seizures (HCC) 07/18/2016    PAST SURGICAL HISTORY: History reviewed. No pertinent surgical history.  FAMILY HISTORY: Family History  Problem Relation Age of Onset  . Seizures Other     SOCIAL HISTORY:  Social History   Social History  .  Marital status: Single    Spouse name: N/A  . Number of children: 0  . Years of education: 12th   Occupational History  .      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 Smoking status: Current Every Day Smoker    Types: Cigarettes  . Smokeless tobacco: Never Used     Comment: 1/30 avg 2/day  . Alcohol use No  . Drug use: Yes    Types: Marijuana     Comment: 1/30 avg 1-2 x daily  . Sexual activity: Yes   Other Topics Concern  . Not on file   Social History Narrative   Patient lives at home with her mother.   Caffeine Use:  sodas daily     PHYSICAL EXAM  GENERAL EXAM/CONSTITUTIONAL: Vitals:  Vitals:   11/04/16 1014  BP: 124/83  Pulse: (!) 103  Weight: 136 lb 3.2 oz (61.8 kg)  Height:  (1.727 m)   Body mass index is 20.71 kg/m. No exam data present  Patient is in no distress; well developed, nourished and groomed; neck is supple  CARDIOVASCULAR:  Examination of carotid arteries is normal; no carotid bruits  Regular rate and rhythm, no murmurs  Examination of peripheral vascular system by observation and palpation is normal  EYES:  Ophthalmoscopic exam of optic discs and posterior segments is normal; no papilledema or hemorrhages  MUSCULOSKELETAL:  Gait, strength, tone, movements noted in Neurologic exam below  NEUROLOGIC: MENTAL STATUS:  No flowsheet data found.  awake, alert, oriented to person, place and time  recent and remote memory intact  normal attention and concentration  language fluent, comprehension intact, naming intact,   fund of knowledge appropriate  CRANIAL NERVE:   2nd - no papilledema on fundoscopic exam  2nd, 3rd, 4th, 6th - pupils equal and reactive to light, visual fields full to confrontation, extraocular muscles intact, no nystagmus  5th - facial sensation symmetric  7th - facial strength symmetric  8th - hearing intact  9th - palate elevates symmetrically, uvula midline  11th - shoulder shrug symmetric  12th - tongue protrusion midline  MOTOR:   normal bulk and tone, full strength in the BUE, BLE  SENSORY:   normal and symmetric to light touch, temperature, vibration  COORDINATION:   finger-nose-finger, fine finger movements normal  REFLEXES:   deep tendon reflexes present and symmetric  GAIT/STATION:   narrow based gait    DIAGNOSTIC DATA (LABS, IMAGING, TESTING) - I reviewed patient records, labs, notes, testing and imaging myself where available.  Lab Results  Component Value Date   WBC 6.5 10/27/2016   HGB  12.5 10/27/2016   HCT 36.1 10/27/2016   MCV 97.6 10/27/2016   PLT 236 10/27/2016      Component Value Date/Time   NA 138 10/27/2016 1950   K 3.5 10/27/2016 1950   CL 114 (H) 10/27/2016 1950   CO2 18 (L) 10/27/2016 1950   GLUCOSE 100 (H) 10/27/2016 1950   BUN 12 10/27/2016 1950   CREATININE 0.66 10/27/2016 1950   CREATININE 0.76 10/25/2016 0859   CALCIUM 9.1 10/27/2016 1950   PROT 6.6 10/27/2016 1950   ALBUMIN 4.1 10/27/2016 1950   AST 17 10/27/2016 1950   ALT 13 (L) 10/27/2016 1950   ALKPHOS 56 10/27/2016 1950   BILITOT 0.3 10/27/2016 1950   GFRNONAA >60 10/27/2016 1950   GFRNONAA >89 10/25/2016 0859   GFRAA >60 10/27/2016 1950   GFRAA >89 10/25/2016 1610  No results found for: CHOL, HDL, LDLCALC, LDLDIRECT, TRIG, CHOLHDL No results found for: WUJW1X No results found for: VITAMINB12 Lab Results  Component Value Date   TSH 1.51 10/25/2016     06/19/12 MRI brain [I reviewed images myself and agree with interpretation. -VRP]  - Images are degraded by motion.  Allowing for this no mass or infarct is identified.  Lateral ventricles are mildly prominent but may be within normal limits.  06/19/12 EEG  - This is an abnormal EEG secondary to focal sharp wave activity in the frontal region on the left.  This finding is suggestive of a focal disturbance with epileptogenic potential.   10/21/16 CT abd/pelvis - Ovarian cystic change as well as minimal free pelvic fluid. This may be related to recent ovarian cyst rupture. - No other focal abnormality is seen.  10/22/16 u/s RUQ - Cholelithiasis without sonographic evidence of acute cholecystitis.     ASSESSMENT AND PLAN  26 y.o. year old female here with seizure disorder since 2013, with abnormal EEG showing left frontal sharp wave activity. Also with intermittent headaches. Also with limited financial and health insurance resources. Also with intermittent abdominal pain.    Dx:  1. Seizure disorder (HCC)   2. Migraine  without aura and without status migrainosus, not intractable      PLAN:  - continue topiramate  twice a day  - continue vimpat  twice a day for 1 week, then increase to vimpat  twice a day  - after using vimpat  twice a day for 1 week, then reduce levetiracetam to  daily for 1 more week, then stop levetiracetam  - in future may consider lamotrigine, depakote or tegretol  - gave patient form to apply for financial assistance through [facility]  Return in about 2 months (around 01/04/2017).     Suanne Marker, MD 11/04/2016, 10:27 AM Certified in Neurology, Neurophysiology and Neuroimaging  [facility] [address] [place], [place] [address] [phone]

## 2016-11-04 NOTE — Patient Instructions (Signed)
-   continue topiramate  twice a day  - continue vimpat  twice a day for 1 week, then increase to vimpat  twice a day  - after using vimpat  twice a day for 1 week, then reduce levetiracetam to  daily for 1 more week, then stop levetiracetam  - please apply for financial assistance through [facility]

## 2016-11-04 NOTE — Telephone Encounter (Signed)
Patient called and wanted to ask some questions regarding being switched to a different neurologist. Can you please give her a call back? Thank you!

## 2016-11-07 NOTE — Telephone Encounter (Signed)
Please transfer call to me if patient returns my call. Attempted to return call from message left two days ago.

## 2016-11-18 ENCOUNTER — Telehealth: Payer: Self-pay | Admitting: Diagnostic Neuroimaging

## 2016-11-18 NOTE — Telephone Encounter (Signed)
She should continue to follow my recs for increasing vimpat and tapering off keppra. -VRP

## 2016-11-18 NOTE — Telephone Encounter (Signed)
Patient called office in reference to levETIRAcetam (KEPPRA) 250 MG tablet, lacosamide (VIMPAT) 50 MG TABS tablet.  Patient states today marks 2 weeks she has decreased Keppra she would like to know what to do now does she continue the Keppra along with the Vimpat.  Patient states her memory has gotten very bad.  Please call

## 2016-11-18 NOTE — Telephone Encounter (Addendum)
Per Dr Marjory LiesPenumalli spoke with patient and gave her specific instructions:  Week 1:increase  vimpat to 100mg  twice a day for 1 week, continue keppra 250 mg twice daily Week 2:  cont vimpat 100 mg twice daily, reduce levetiracetam to 250mg  daily for 1 week Week 3: cont vimpat 100 mg twice daily, stop levetiracetam.  Patient stated she wrote down instructions. This RN had her read what she had written; it was correct. Advised she tape instructions up in her home so she would not lose them. Patient verbalized understanding, appreciation.

## 2016-11-18 NOTE — Telephone Encounter (Addendum)
Called patient to discuss. Reviewed Dr Richrd HumblesPenumalli's specific medication instructions per her last office visit.   Patient stated she has been taking vimpat 50 mg twice daily, topamax 100 mg twice daily and keppra 250 mg twice daily x 2 weeks. She stated she needs to know what to do next. This RN asked if she still had her AVS with the instrucitons. Patient stated she has so many papers from the ED that she cannot find it. She stated she has a bad memory.  This RN advised will discuss with Dr Marjory LiesPenumalli and call her back. She verbalized understanding, appreciation.

## 2016-11-26 ENCOUNTER — Ambulatory Visit (INDEPENDENT_AMBULATORY_CARE_PROVIDER_SITE_OTHER): Payer: Self-pay | Admitting: Gastroenterology

## 2016-11-26 VITALS — BP 122/78 | HR 86 | Ht 68.0 in | Wt 133.0 lb

## 2016-11-26 DIAGNOSIS — R194 Change in bowel habit: Secondary | ICD-10-CM

## 2016-11-26 DIAGNOSIS — F411 Generalized anxiety disorder: Secondary | ICD-10-CM

## 2016-11-26 DIAGNOSIS — R1084 Generalized abdominal pain: Secondary | ICD-10-CM

## 2016-11-26 DIAGNOSIS — G40909 Epilepsy, unspecified, not intractable, without status epilepticus: Secondary | ICD-10-CM

## 2016-11-26 DIAGNOSIS — K8020 Calculus of gallbladder without cholecystitis without obstruction: Secondary | ICD-10-CM

## 2016-11-26 DIAGNOSIS — R110 Nausea: Secondary | ICD-10-CM

## 2016-11-26 MED ORDER — ONDANSETRON HCL 4 MG PO TABS: 8.0000 mg | ORAL_TABLET | Freq: Three times a day (TID) | ORAL | 1 refills | Status: DC | PRN

## 2016-11-26 NOTE — Telephone Encounter (Signed)
Spoke with patient and advised she should continue to taper Keppra, taking one tablet daily for the remainder of this week. She questioned if it mattered whether she took it in morning or night. This RN advised it does not matter, but to take it at the same time every day.  This RN reviewed her Keppra taper and  Vimpat schedule with her again. Patient stated understanding. Patient wanted to be certain Dr Marjory LiesPenumalli knew Zofran was increased. Advised her that her record already has increase Zofran dose recorded. Advised she call if she continues to have restless nights or other symptoms she is concerned about. Patient verbalized understanding, appreciation. Will route call to Dr Marjory LiesPenumalli for his review.

## 2016-11-26 NOTE — Progress Notes (Signed)
[facility] Consult Note:  History: Lisa Shaw 11/26/2016  Referring physician: Bing Neighbors, FNP  Reason for consult/chief complaint: Abdominal Pain (onset x 1 month, nausea, diarrhea, constipation, unsure of fever)   Subjective  HPI:  This is a 26 year old woman referred by primary care for abdominal pain and altered bowel habits after multiple ED visits within the last 6 weeks. She is a tangential and limited historian, which she attributes to memory difficulty from a seizure disorder. Initially, Lisa Shaw was frustrated and reluctant to give me a good description of her symptoms, asking me to refer to the multiple after visit summary she has had from her EGD and primary care visits.  She describes many years of "a bad stomach". It is difficult to tell exactly what she means by this, but it seems to be intermittent nausea and alternating diarrhea and constipation. Early last month she began having episodes of more consistent right-sided abdominal pain that brought her to the ED on a few occasions. On April 9 she had a CT scan that suggested a possible ruptured ovarian cyst. She was treated with pain meds and sent home. She came back the following day for the same symptoms asking to be admitted. She had an ultrasound that showed multiple gallstones, the largest of which was 2 cm. She was discharged home with outpatient surgical follow-up. Lisa Shaw reports that she has an appointment with the surgeons sometime soon. She was back to the ED on April 15 for abdominal pain, and on April 16 for what was either a seizure or pseudoseizure from panic attack. She reports having been under a significant amount of stress, having lost her job, and multiple other family and social stressors. She describes ongoing nausea for which she uses marijuana. Zofran 4 mg has been insufficient relief, and usually she needs to tablets. She denies rectal bleeding, and tends to alternate  between constipation that will last 2 or 3 days and then diarrhea for a few days. She was concerned that she steadily has lost weight, and record show that she was 135 pounds in January of this year. She was then of 250 pounds mid April, now back down to 133 pounds. She admits that she has been doing a lot of research about the cause of these symptoms and it is causing her great anxiety.  ROS:  Review of Systems  Constitutional: Positive for fatigue and unexpected weight change. Negative for appetite change.  HENT: Negative for mouth sores and voice change.   Eyes: Negative for pain and redness.  Respiratory: Negative for cough and shortness of breath.   Cardiovascular: Negative for chest pain and palpitations.  Genitourinary: Negative for dysuria and hematuria.  Musculoskeletal: Negative for arthralgias and myalgias.  Skin: Negative for pallor and rash.  Neurological: Positive for seizures. Negative for weakness and headaches.  Hematological: Negative for adenopathy.  Psychiatric/Behavioral: Positive for dysphoric mood and sleep disturbance. The patient is nervous/anxious.      Past Medical History: Past Medical History:  Diagnosis Date  . Epilepsy (HCC)   . Migraine   . Seizures (HCC) 07/18/2016     Past Surgical History: No past surgical history on file. She has had no prior surgery  Family History: Family History  Problem Relation Age of Onset  . Seizures Other     Social History: Social History   Social History  . Marital status: Single    Spouse name: N/A  . Number of children: 0  . Years of  education: 12th   Occupational History  .      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 Smoking status: Current Every Day Smoker    Types: Cigarettes  . Smokeless tobacco: Never Used     Comment: 1/30 avg 2/day  . Alcohol use No  . Drug use: Yes    Types: Marijuana     Comment: 1/30 avg 1-2 x daily  . Sexual activity: Yes   Other Topics Concern    . Not on file   Social History Narrative   Patient lives at home with her mother.   Caffeine Use: sodas daily    Allergies: Allergies  Allergen Reactions  . Keppra [Levetiracetam] Other (See Comments)    Reaction:  Headaches     Outpatient Meds: Current Outpatient Prescriptions  Medication Sig Dispense Refill  . carbamide peroxide (DEBROX) 6.5 % otic solution Place 5 drops into both ears 2 (two) times daily as needed. 15 mL 0  . lacosamide (VIMPAT) 50 MG TABS tablet Take 100 mg by mouth 2 (two) times daily.     Marland Kitchen levETIRAcetam (KEPPRA) 250 MG tablet Take 250 mg by mouth daily.    . ondansetron (ZOFRAN) 4 MG tablet Take 2 tablets (8 mg total) by mouth every 8 (eight) hours as needed for nausea or vomiting. 45 tablet 1  . SUMAtriptan (IMITREX) 100 MG tablet Take 1 tablet (100 mg total) by mouth every 2 (two) hours as needed for migraine. May repeat in 2 hours if headache persists or recurs. 10 tablet 1  . topiramate (TOPAMAX) 100 MG tablet Take 1 tablet (100 mg total) by mouth 2 (two) times daily. 60 tablet 12   No current facility-administered medications for this visit.       ___________________________________________________________________ Objective   Exam:  BP 122/78   Pulse 86   Ht 5\' 8"  (1.727 m)   Wt 133 lb (60.3 kg)   LMP 11/02/2016 (Exact Date)   BMI 20.22 kg/m    General: this is a(n) Fatigue and thin young woman. She is laying on the exam table fatigue but not acutely ill-appearing. After going through her workup thus far and discussing findings: Her affect improves, she sits up and becomes more interactive.   Eyes: sclera anicteric, no redness  ENT: oral mucosa moist without lesions, no cervical or supraclavicular lymphadenopathy, good dentition  CV: RRR without murmur, S1/S2, no JVD, no peripheral edema  Resp: clear to auscultation bilaterally, normal RR and effort noted  GI: soft, mild epigastric and right upper quadrant tenderness, mild LLQ  tenderness with active bowel sounds. No guarding or palpable organomegaly noted.  Skin; warm and dry, no rash or jaundice noted  Neuro: awake, alert and oriented x 3. Normal gross motor function and fluent speech  Labs:  CMP Latest Ref Rng & Units 10/27/2016 10/25/2016 10/22/2016  Glucose 65 - 99 mg/dL 914(N) 829(F) 621(H)  BUN 6 - 20 mg/dL 12 11 <0(Q)  Creatinine 0.44 - 1.00 mg/dL 6.57 8.46 9.62  Sodium 135 - 145 mmol/L 138 137 139  Potassium 3.5 - 5.1 mmol/L 3.5 3.8 3.4(L)  Chloride 101 - 111 mmol/L 114(H) 108 111  CO2 22 - 32 mmol/L 18(L) 20 18(L)  Calcium 8.9 - 10.3 mg/dL 9.1 9.7 9.8  Total Protein 6.5 - 8.1 g/dL 6.6 7.2 7.6  Total Bilirubin 0.3 - 1.2 mg/dL 0.3 0.4 0.6  Alkaline Phos 38 - 126 U/L 56 71 78  AST 15 - 41  U/L 17 14 23   ALT 14 - 54 U/L 13(L) 10 15   CBC Latest Ref Rng & Units 10/27/2016 10/25/2016 10/22/2016  WBC 4.0 - 10.5 K/uL 6.5 4.0 5.1  Hemoglobin 12.0 - 15.0 g/dL 16.112.5 09.614.5 04.513.9  Hematocrit 36.0 - 46.0 % 36.1 43.6 40.7  Platelets 150 - 400 K/uL 236 267 244   TOX: + THC  Radiologic Studies:  CT  Ovarian cysts and trace pelvic free fluid, otherwise negative study US - multiple gallstones, CBD 2mm  Assessment: Encounter Diagnoses  Name Primary?  . Generalized abdominal pain Yes  . Gallstones   . Altered bowel habits   . Nausea without vomiting   . Generalized anxiety disorder   . Seizure disorder [facility](HCC)     It is difficult to characterize her symptoms based on her history. I do believe she has symptomatic gallstones, but I suspect she also has an underlying functional bowel disorder exacerbated by chronic anxiety. I believe that accounts somewhat migratory abdominal pain and altered bowel habits. I do not think she has inflammatory bowel disease, peptic ulcer or malignancy, and I have no current plans for endoscopic procedures.  I'm glad she has an upcoming appointment with general surgery, because I believe she will need her gallbladder removed.  I have  encouraged her to follow up with primary care regarding her multiple medical issues and she reports having a counselor or therapist who addresses her anxiety. I do not know whether anxiolytic medicines of been used in the past, but they are probably worth consideration given the events of late.  I increase her doses Zofran 8 mg every 8 hours as needed. I am reluctant to use Compazine or Phenergan out of concern for possible interaction with her seizure medicine.  I have recommended once daily MiraLAX to relieve constipation. I do not want to use antispasmodic medicines given her tendencies to constipation and the use of Zofran.  Plan:  Above plan was given to her in written form and she will see me as needed.  Thank you for the courtesy of this consult.  Please call me with any questions or concerns.  Charlie PitterHenry L Danis III  CC: Bing NeighborsHarris, Kimberly S, FNP

## 2016-11-26 NOTE — Telephone Encounter (Signed)
Pt called said this is the 2nd week of taking keppra 1x day, she takes it at night. Pt said she she had a restless night last night-tossing and turning, her mother said she was humming also like she was trying to say something. Please call She also advised gastroentrologist increased ondansetron (ZOFRAN) 4 MG tablet to 2tablets every 8 hrs prn.

## 2016-11-26 NOTE — Patient Instructions (Addendum)
Miralax 1 capful in a glass of water once daily to relieve constipation  I have increased the dose of your zofran for nausea  Please see the surgeon about your gallstones.  Please follow up with your primary care provider about your other medical issues.  If you are age 25 or older, your body mass index should be between 23-30. Your Body mass index is 20.22 kg/m. If this is out of the aforementioned range listed, please consider follow up with your Primary Care Provider.  If you are age 25 or younger, your body mass index should be between 19-25. Your Body mass index is 20.22 kg/m. If this is out of the aformentioned range listed, please consider follow up with your Primary Care Provider.   Thank you for choosing [facility]  Dr Amada JupiterHenry Danis III

## 2016-11-28 ENCOUNTER — Telehealth: Payer: Self-pay | Admitting: Diagnostic Neuroimaging

## 2016-11-28 MED ORDER — TOPIRAMATE 100 MG PO TABS: 100.0000 mg | ORAL_TABLET | Freq: Two times a day (BID) | ORAL | 11 refills | Status: DC

## 2016-11-28 NOTE — Telephone Encounter (Signed)
Pt is now using [facility] for all refills, she is requesting topiramate (TOPAMAX) 100 MG tablet to be sent there. Pt has 4 pills left.

## 2016-12-03 ENCOUNTER — Ambulatory Visit: Payer: Self-pay | Admitting: Diagnostic Neuroimaging

## 2016-12-09 ENCOUNTER — Telehealth: Payer: Self-pay | Admitting: Neurology

## 2016-12-09 NOTE — Telephone Encounter (Signed)
Patient called reporting an intractable headache. She takes OTC medication. Would like to speak with Dr. Demetrius CharityP on Tuesday.

## 2016-12-10 ENCOUNTER — Other Ambulatory Visit: Payer: Self-pay | Admitting: Diagnostic Neuroimaging

## 2016-12-10 ENCOUNTER — Telehealth: Payer: Self-pay | Admitting: Diagnostic Neuroimaging

## 2016-12-10 MED ORDER — LACOSAMIDE 200 MG PO TABS: 200.0000 mg | ORAL_TABLET | Freq: Two times a day (BID) | ORAL | 5 refills | Status: DC

## 2016-12-10 NOTE — Telephone Encounter (Signed)
Pt called answer service 5/25 @2 :58 stating she had a bad headache and the medication was not working. Please call [phone]

## 2016-12-10 NOTE — Telephone Encounter (Signed)
Received call back from patient. She stated she took sumatriptan Friday for her headache, and today it has "eased". She stated when she talked to her mother about her symptoms, her mother suggested she may have had a mini seizure Thurs because she woke up Friday with "a bite mark", although the patient cannot remember. The patient stated she wakes up every morning with a "bite mark on the side of her jaw". She also reports tooth pain and nausea for which she takes Zofran.  She is not taking Topiramate, does not have any pills, she stated. She bought Tylenol and BC Powders and took each once twice daily for her headache plus a caffeine soda.  She stated she sees her PCP tomorrow, will also let PCP know her nose is stopped up, and she may be having allergy issues. She will also discuss tooth pain. The patient stated she met with someone last Fri who may be able to help her with medication costs. Her financial assistance with [facility] is pending review.  Patient is asking if her seizure medication is enough since she is no longer taking Keppra. This RN confirmed with patient that she is taking Vimpat 100 mg twice daily.  She also questioned if she can be prescribed Vimpat 100 mg; she is receiving Vimpat through patient assistance. This RN stated Rance Muir would have to check on that for her. Advised patient this RN would send note to Dr Tish Frederickson and call her back. Will also send to [facility] to check on Vimpat. Advised patient that the dose of Vimpat may be controlled by the patient assistant program.  Patient is requesting new Rx for Vimpat 100 mg tabs so she will only have to take one twice daily.  Advised patient this RN will call her with replies to her questions.  She verbalized understanding, appreciation.

## 2016-12-10 NOTE — Telephone Encounter (Signed)
Continue vimpat; may increase to 200mg  twice a day. -VRP

## 2016-12-10 NOTE — Telephone Encounter (Signed)
LVM requesting call back to give more information.

## 2016-12-10 NOTE — Telephone Encounter (Signed)
Spoke with patient and informed her Dr Marjory LiesPenumalli increased Vimpat to 200 mg twice daily. Advised her that new Rx will have to be sent to V[facility] pt assistance, and Darreld McleanDana Cox will be out of office for a few days. Patient stated she is getting new refill tomorrow but will not increase until the weekend. Advised her that her PCP can address headache which may be due to her sinus issues and tooth ache. Patient verbalized understanding, appreciation for call back. Will give D Cox Vimpat Rx tomorrow ; she has left for the day.

## 2016-12-10 NOTE — Telephone Encounter (Signed)
I faxed the Vimpat RX to [facility] patient assist's. Faxed # [phone].

## 2016-12-11 ENCOUNTER — Encounter: Payer: Self-pay | Admitting: Family Medicine

## 2016-12-11 ENCOUNTER — Other Ambulatory Visit (HOSPITAL_COMMUNITY)
Admission: RE | Admit: 2016-12-11 | Discharge: 2016-12-11 | Disposition: A | Discharge status: Home / Self Care | Payer: Self-pay | Source: Ambulatory Visit | Attending: Family Medicine | Admitting: Family Medicine

## 2016-12-11 ENCOUNTER — Ambulatory Visit (INDEPENDENT_AMBULATORY_CARE_PROVIDER_SITE_OTHER): Payer: Self-pay | Admitting: Family Medicine

## 2016-12-11 VITALS — BP 115/72 | HR 91 | Temp 97.6°F | Ht 68.0 in | Wt 136.0 lb

## 2016-12-11 DIAGNOSIS — Z01419 Encounter for gynecological examination (general) (routine) without abnormal findings: Secondary | ICD-10-CM

## 2016-12-11 DIAGNOSIS — Z0271 Encounter for disability determination: Secondary | ICD-10-CM

## 2016-12-11 DIAGNOSIS — J029 Acute pharyngitis, unspecified: Secondary | ICD-10-CM

## 2016-12-11 LAB — RESULTS CONSOLE HPV: CHL HPV: NEGATIVE

## 2016-12-11 LAB — POCT RAPID STREP A (OFFICE): RAPID STREP A SCREEN: NEGATIVE

## 2016-12-11 LAB — HM PAP SMEAR

## 2016-12-11 NOTE — Telephone Encounter (Signed)
Noted  

## 2016-12-11 NOTE — Progress Notes (Deleted)
   Patient ID: Lisa Shaw, female    DOB: 04/29/1991, 26 y.o.   MRN: [id number]  PCP: Bing NeighborsHarris, Mckennah Kretchmer S, FNP  No chief complaint on file.   Subjective:  HPI  Lisa Shaw is a 26 y.o. female presents for a PAP only visit.    Social History   Social History  . Marital status: Single    Spouse name: N/A  . Number of children: 0  . Years of education: 12th   Occupational History  .      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 Smoking status: Current Every Day Smoker    Types: Cigarettes  . Smokeless tobacco: Never Used     Comment: 1/30 avg 2/day  . Alcohol use No  . Drug use: Yes    Types: Marijuana     Comment: 1/30 avg 1-2 x daily  . Sexual activity: Yes   Other Topics Concern  . Not on file   Social History Narrative   Patient lives at home with her mother.   Caffeine Use: sodas daily    Family History  Problem Relation Age of Onset  . Seizures Other      Review of Systems  Patient Active Problem List   Diagnosis Date Noted  . Abdominal pain 10/29/2016  . Seizures (HCC) 10/25/2016    Allergies  Allergen Reactions  . Keppra [Levetiracetam] Other (See Comments)    Reaction:  Headaches     Prior to Admission medications   Medication Sig Start Date End Date Taking? Authorizing Provider  carbamide peroxide (DEBROX) 6.5 % otic solution Place 5 drops into both ears 2 (two) times daily as needed. 10/30/16   Bing NeighborsHarris, Railynn Ballo S, FNP  lacosamide (VIMPAT) 200 MG TABS tablet Take 1 tablet (200 mg total) by mouth 2 (two) times daily. 12/10/16   Penumalli, Glenford BayleyVikram R, MD  ondansetron (ZOFRAN) 4 MG tablet Take 2 tablets (8 mg total) by mouth every 8 (eight) hours as needed for nausea or vomiting. 11/26/16   Danis, Andreas BlowerHenry L III, MD  SUMAtriptan (IMITREX) 100 MG tablet Take 1 tablet (100 mg total) by mouth every 2 (two) hours as needed for migraine. May repeat in 2 hours if headache persists or recurs. 08/13/16   Penumalli, Glenford BayleyVikram R, MD   topiramate (TOPAMAX) 100 MG tablet Take 1 tablet (100 mg total) by mouth 2 (two) times daily. 11/28/16   Penumalli, Glenford BayleyVikram R, MD    Past Medical, Surgical Family and Social History reviewed and updated.    Objective:  There were no vitals filed for this visit.  Wt Readings from Last 3 Encounters:  11/26/16 133 lb (60.3 kg)  11/04/16 136 lb 3.2 oz (61.8 kg)  10/30/16 135 lb (61.2 kg)    Physical Exam         Assessment & Plan:  There are no diagnoses linked to this encounter.   Godfrey PickKimberly S. Tiburcio PeaHarris, MSN, FNP-C [facility]  [address] [place], [place] [address] [phone]

## 2016-12-11 NOTE — Progress Notes (Addendum)
Patient ID: Lisa Shaw, female    DOB: 02/21/1991, 26 y.o.   MRN: [id number]  PCP: Bing NeighborsHarris, Laurissa Cowper S, FNP  Chief Complaint  Patient presents with  . Gynecologic Exam  . Sore Throat    Subjective:  HPI Lisa Shaw is a [age] y.o. female presents for a PAP only visit. She has an additional complaint of sore throat in absence of fever with mild congestion. Lamija denies any vaginal discharge, itching, or odor today. Courtny requests STD testing.  Social History   Social History  . Marital status: Single    Spouse name: N/A  . Number of children: 0  . Years of education: 12th   Occupational History  .      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 Smoking status: Current Every Day Smoker    Types: Cigarettes  . Smokeless tobacco: Never Used     Comment: 1/30 avg 2/day  . Alcohol use No  . Drug use: Yes    Types: Marijuana     Comment: 1/30 avg 1-2 x daily  . Sexual activity: Yes   Other Topics Concern  . Not on file   Social History Narrative   Patient lives at home with her mother.   Caffeine Use: sodas daily    Family History  Problem Relation Age of Onset  . Seizures Other    Review of Systems  See HPI  Patient Active Problem List   Diagnosis Date Noted  . Abdominal pain 10/29/2016  . Seizures (HCC) 10/25/2016    Allergies  Allergen Reactions  . Keppra [Levetiracetam] Other (See Comments)    Reaction:  Headaches     Prior to Admission medications   Medication Sig Start Date End Date Taking? Authorizing Provider  carbamide peroxide (DEBROX) 6.5 % otic solution Place 5 drops into both ears 2 (two) times daily as needed. 10/30/16  Yes Bing NeighborsHarris, Calisha Tindel S, FNP  lacosamide (VIMPAT) 200 MG TABS tablet Take 1 tablet (200 mg total) by mouth 2 (two) times daily. 12/10/16  Yes Penumalli, Glenford BayleyVikram R, MD  ondansetron (ZOFRAN) 4 MG tablet Take 2 tablets (8 mg total) by mouth every 8 (eight) hours as needed for nausea or vomiting.  11/26/16  Yes Danis, Andreas BlowerHenry L III, MD  SUMAtriptan (IMITREX) 100 MG tablet Take 1 tablet (100 mg total) by mouth every 2 (two) hours as needed for migraine. May repeat in 2 hours if headache persists or recurs. 08/13/16  Yes Penumalli, Glenford BayleyVikram R, MD  topiramate (TOPAMAX) 100 MG tablet Take 1 tablet (100 mg total) by mouth 2 (two) times daily. 11/28/16  Yes Penumalli, Glenford BayleyVikram R, MD    Past Medical, Surgical Family and Social History reviewed and updated.    Objective:   Today's Vitals   12/11/16 1007  BP: 115/72  Pulse: 91  Temp: 97.6 F (36.4 C)  TempSrc: Oral  SpO2: 99%  Weight: 136 lb (61.7 kg)  Height: 5\' 8"  (1.727 m)    Wt Readings from Last 3 Encounters:  12/11/16 136 lb (61.7 kg)  11/26/16 133 lb (60.3 kg)  11/04/16 136 lb 3.2 oz (61.8 kg)    Physical Exam  Constitutional: She appears well-developed and well-nourished.  Cardiovascular: Normal rate, regular rhythm and normal heart sounds.   Pulmonary/Chest: Effort normal.  Abdominal: Bowel sounds are normal.  Genitourinary:  Genitourinary Comments: Normal female external genitalia without lesion. No inguinal lymphadenopathy. Vaginal mucosa is pink and moist without lesions. Cervix  is closed without discharge, not friable. Pap smear obtained. No cervical motion tenderness, adnexal fullness or tenderness.    Assessment & Plan:  1. Encounter for well woman exam with routine gynecological exam - Cytology - PAP [place]  2. Sore throat -negative Rapid Strep -Continue taking the otc antihistamine medication you were taking previous, daily. -Warm salt water gargle.  You will ne notified of lab results.  RTC: 6 months routine chronic disease management.   Godfrey Pick. Tiburcio Pea, MSN, FNP-C [facility]  [address] [place], [place] [address] [phone]

## 2016-12-11 NOTE — Patient Instructions (Signed)
Resume taking over the counter antihistamines daily for congestion and throat soreness.  You will be notified of your PAP results in 5-7 days.    Pap Test Why am I having this test? A pap test is sometimes called a pap smear. It is a screening test that is used to check for signs of cancer of the vagina, cervix, and uterus. The test can also identify the presence of infection or precancerous changes. Your health care provider will likely recommend you have this test done on a regular basis. This test may be done:  Every 3 years, starting at age 25.  Every 5 years, in combination with testing for the presence of human papillomavirus (HPV).  More or less often depending on other medical conditions. What kind of sample is taken? Using a small cotton swab, plastic spatula, or brush, your health care provider will collect a sample of cells from the surface of your cervix. Your cervix is the opening to your uterus, also called a womb. Secretions from the cervix and vagina may also be collected. How do I prepare for this test?  Be aware of where you are in your menstrual cycle. You may be asked to reschedule the test if you are menstruating on the day of the test.  You may need to reschedule if you have a known vaginal infection on the day of the test.  You may be asked to avoid douching or taking a bath the day before or the day of the test.  Some medicines can cause abnormal test results, such as digitalis and tetracycline. Talk with your health care provider before your test if you take one of these medicines. What do the results mean? Abnormal test results may indicate a number of health conditions. These may include:  Cancer. Although pap test results cannot be used to diagnose cancer of the cervix, vagina, or uterus, they may suggest the possibility of cancer. Further tests would be required to determine if cancer is present.  Sexually transmitted disease.  Fungal  infection.  Parasite infection.  Herpes infection.  A condition causing or contributing to infertility. It is your responsibility to obtain your test results. Ask the lab or department performing the test when and how you will get your results. Contact your health care provider to discuss any questions you have about your results. Talk with your health care provider to discuss your results, treatment options, and if necessary, the need for more tests. Talk with your health care provider if you have any questions about your results. This information is not intended to replace advice given to you by your health care provider. Make sure you discuss any questions you have with your health care provider. Document Released: 09/21/2002 Document Revised: 03/06/2016 Document Reviewed: 11/22/2013 Elsevier Interactive Patient Education  2017 [employer] Inc.  Allergies, Adult An allergy is when your body's defense system (immune system) overreacts to an otherwise harmless substance (allergen) that you breathe in or eat or something that touches your skin. When you come into contact with something that you are allergic to, your immune system produces certain proteins (antibodies). These proteins cause cells to release chemicals (histamines) that trigger the symptoms of an allergic reaction. Allergies often affect the nasal passages (allergic rhinitis), eyes (allergic conjunctivitis), skin (atopic dermatitis), and stomach. Allergies can be mild or severe. Allergies cannot spread from person to person (are not contagious). They can develop at any age and may be outgrown. What are the causes? Allergies can be caused  by any substance that your immune system mistakenly targets as harmful. These may include:  Outdoor allergens, such as pollen, grass, weeds, car exhaust, and mold spores.  Indoor allergens, such as dust, smoke, mold, and pet dander.  Foods, especially peanuts, milk, eggs, fish, shellfish, soy,  nuts, and wheat.  Medicines, such as penicillin.  Skin irritants, such as detergents, chemicals, and latex.  Perfume.  Insect bites or stings. What increases the risk? You may be at greater risk of allergies if other people in your family have allergies. What are the signs or symptoms? Symptoms depend on what type of allergy you have. They may include:  Runny, stuffy nose.  Sneezing.  Itchy mouth, ears, or throat.  Postnasal drip.  Sore throat.  Itchy, red, watery, or puffy eyes.  Skin rash or hives.  Stomach pain.  Vomiting.  Diarrhea.  Bloating.  Wheezing or coughing. People with a severe allergy to food, medicine, or an insect bite may have a life-threatening allergic reaction (anaphylaxis). Symptoms of anaphylaxis include:  Hives.  Itching.  Flushed face.  Swollen lips, tongue, or mouth.  Tight or swollen throat.  Chest pain or tightness in the chest.  Trouble breathing or shortness of breath.  Rapid heartbeat.  Dizziness or fainting.  Vomiting.  Diarrhea.  Pain in the abdomen. How is this diagnosed? This condition is diagnosed based on:  Your symptoms.  Your family and medical history.  A physical exam. You may need to see a health care provider who specializes in treating allergies (allergist). You may also have tests, including:  Skin tests to see which allergens are causing your symptoms, such as:  Skin prick test. In this test, your skin is pricked with a tiny needle and exposed to small amounts of possible allergens to see if your skin reacts.  Intradermal skin test. In this test, a small amount of allergen is injected under your skin to see if your skin reacts.  Patch test. In this test, a small amount of allergen is placed on your skin and then your skin is covered with a bandage. Your health care provider will check your skin after a couple of days to see if a rash has developed.  Blood tests.  Challenges tests. In this  test, you inhale a small amount of allergen by mouth to see if you have an allergic reaction. You may also be asked to:  Keep a food diary. A food diary is a record of all the foods and drinks you have in a day and any symptoms you experience.  Practice an elimination diet. An elimination diet involves eliminating specific foods from your diet and then adding them back in one by one to find out if a certain food causes an allergic reaction. How is this treated? Treatment for allergies depends on your symptoms. Treatment may include:  Cold compresses to soothe itching and swelling.  Eye drops.  Nasal sprays.  Using a saline spray or container (neti pot) to flush out the nose (nasal irrigation). These methods can help clear away mucus and keep the nasal passages moist.  Using a humidifier.  Oral antihistamines or other medicines to block allergic reaction and inflammation.  Skin creams to treat rashes or itching.  Diet changes to eliminate food allergy triggers.  Repeated exposure to tiny amounts of allergens to build up a tolerance and prevent future allergic reactions (immunotherapy). These include:  Allergy shots.  Oral treatment. This involves taking small doses of an allergen under the  tongue (sublingual immunotherapy).  Emergency epinephrine injection (auto-injector) in case of an allergic emergency. This is a self-injectable, pre-measured medicine that must be given within the first few minutes of a serious allergic reaction. Follow these instructions at home:  Avoid known allergens whenever possible.  If you suffer from airborne allergens, wash out your nose daily. You can do this with a saline spray or a neti pot to flush out your nose (nasal irrigation).  Take over-the-counter and prescription medicines only as told by your health care provider.  Keep all follow-up visits as told by your health care provider. This is important.  If you are at risk of a severe  allergic reaction (anaphylaxis), keep your auto-injector with you at all times.  If you have ever had anaphylaxis, wear a medical alert bracelet or necklace that states you have a severe allergy. Contact a health care provider if:  Your symptoms do not improve with treatment. Get help right away if:  You have symptoms of anaphylaxis, such as:  Swollen mouth, tongue, or throat.  Pain or tightness in your chest.  Trouble breathing or shortness of breath.  Dizziness or fainting.  Severe abdominal pain, vomiting, or diarrhea. This information is not intended to replace advice given to you by your health care provider. Make sure you discuss any questions you have with your health care provider. Document Released: 09/24/2002 Document Revised: 02/29/2016 Document Reviewed: 01/17/2016 Elsevier Interactive Patient Education  2017 [employer].

## 2016-12-12 ENCOUNTER — Encounter: Payer: Self-pay | Admitting: Family Medicine

## 2016-12-12 ENCOUNTER — Ambulatory Visit (INDEPENDENT_AMBULATORY_CARE_PROVIDER_SITE_OTHER): Payer: Self-pay | Admitting: Family Medicine

## 2016-12-12 VITALS — BP 125/74 | HR 88 | Temp 98.4°F | Resp 16 | Ht 68.0 in | Wt 136.0 lb

## 2016-12-12 DIAGNOSIS — L709 Acne, unspecified: Secondary | ICD-10-CM

## 2016-12-12 DIAGNOSIS — J029 Acute pharyngitis, unspecified: Secondary | ICD-10-CM

## 2016-12-12 DIAGNOSIS — R200 Anesthesia of skin: Secondary | ICD-10-CM

## 2016-12-12 DIAGNOSIS — R202 Paresthesia of skin: Secondary | ICD-10-CM

## 2016-12-12 LAB — COMPLETE METABOLIC PANEL WITH GFR
ALBUMIN: 4.2 g/dL (ref 3.6–5.1)
ALK PHOS: 78 U/L (ref 33–115)
ALT: 12 U/L (ref 6–29)
AST: 14 U/L (ref 10–30)
BILIRUBIN TOTAL: 0.3 mg/dL (ref 0.2–1.2)
BUN: 11 mg/dL (ref 7–25)
CALCIUM: 9.6 mg/dL (ref 8.6–10.2)
CO2: 16 mmol/L — ABNORMAL LOW (ref 20–31)
Chloride: 113 mmol/L — ABNORMAL HIGH (ref 98–110)
Creat: 0.72 mg/dL (ref 0.50–1.10)
GFR, Est African American: >89 mL/min (ref >=60)
GLUCOSE: 83 mg/dL (ref 65–99)
Potassium: 3.7 mmol/L (ref 3.5–5.3)
SODIUM: 140 mmol/L (ref 135–146)
TOTAL PROTEIN: 6.9 g/dL (ref 6.1–8.1)

## 2016-12-12 LAB — CBC WITH DIFFERENTIAL/PLATELET
BASOS ABS: 68 {cells}/uL (ref 0–200)
Basophils Relative: 1 %
EOS ABS: 0 {cells}/uL — AB (ref 15–500)
EOS PCT: 0 %
HEMATOCRIT: 40.8 % (ref 35.0–45.0)
HEMOGLOBIN: 12.9 g/dL (ref 11.7–15.5)
LYMPHS ABS: 1768 {cells}/uL (ref 850–3900)
Lymphocytes Relative: 26 %
MCH: 33.2 pg — AB (ref 27.0–33.0)
MCHC: 31.6 g/dL — ABNORMAL LOW (ref 32.0–36.0)
MCV: 105.2 fL — ABNORMAL HIGH (ref 80.0–100.0)
MONO ABS: 340 {cells}/uL (ref 200–950)
MPV: 8.6 fL (ref 7.5–12.5)
Monocytes Relative: 5 %
NEUTROS ABS: 4624 {cells}/uL (ref 1500–7800)
NEUTROS PCT: 68 %
Platelets: 286 10*3/uL (ref 140–400)
RBC: 3.88 MIL/uL (ref 3.80–5.10)
RDW: 13.9 % (ref 11.0–15.0)
WBC: 6.8 10*3/uL (ref 3.8–10.8)

## 2016-12-12 LAB — POCT URINALYSIS DIP (DEVICE)
Bilirubin Urine: NEGATIVE
GLUCOSE, UA: NEGATIVE mg/dL
Hgb urine dipstick: NEGATIVE
Ketones, ur: NEGATIVE mg/dL
Leukocytes, UA: NEGATIVE
NITRITE: NEGATIVE
PH: 7 (ref 5.0–8.0)
PROTEIN: NEGATIVE mg/dL
Specific Gravity, Urine: 1.02 (ref 1.005–1.030)
Urobilinogen, UA: 0.2 mg/dL (ref 0.0–1.0)

## 2016-12-12 MED ORDER — ADAPALENE 0.1 % EX GEL: Freq: Every day | CUTANEOUS | 0 refills | Status: DC

## 2016-12-12 NOTE — Progress Notes (Signed)
Patient ID: Lisa Shaw, female    DOB: 05/25/1991, 26 y.o.   MRN: [id number]  PCP: Bing NeighborsHarris, Jennine Peddy S, FNP  Chief Complaint  Patient presents with  . Cough  . Sore Throat  . hands and feet tingling    Subjective:  HPI  Lisa Shaw is a 26 y.o. female presents for evaluation of cough, sore throat, numbness and  tiingling all over.  Toia was seen in office yesterday for a routine office visit and gynecological exam. During that visit, she was evaluated for sore throat and nasal congestions, as treated  conservatively with recommendation to take antihistamine and gargles with warm salt and water. At this point, Lisa Shaw begins to ramble on and on about symptoms of her eyes are draining, stomach pain for which she is having surgery, seizures, facial numbness, numbness and tingling all over her body. At this point, Lisa Shaw was asked if she has [facility] out [facility] as she is currently displaying  symptoms consistent with compulsion and obsession with symptoms and illness. She  states that [facility] is [facility] and was not prescribed any medications. She is admit that they symptoms are not "in my head" as she is not working "states, I sit at home all day and see what's going wrong with my body".  Acne  Requests an antibiotic for acne. She requested an antibiotic previously for acne like lesions to her  bilateral jaw line. Advised during previous visit, skin nor papules appear to be nodular cystic or inflame, and with chronic GI symptoms, and an antibiotic would not be the best the choice for treatment. She has never tried any OTC treatments in the past.  Social History   Social History  . Marital status: Single    Spouse name: N/A  . Number of children: 0  . Years of education: 12th   Occupational History  .      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 Smoking status: Current Every Day Smoker    Types: Cigarettes  .  Smokeless tobacco: Never Used     Comment: 1/30 avg 2/day  . Alcohol use No  . Drug use: Yes    Types: Marijuana     Comment: 1/30 avg 1-2 x daily  . Sexual activity: Yes   Other Topics Concern  . Not on file   Social History Narrative   Patient lives at home with her mother.   Caffeine Use: sodas daily    Family History  Problem Relation Age of Onset  . Seizures Other    Review of Systems See HPI  Patient Active Problem List   Diagnosis Date Noted  . Abdominal pain 10/29/2016  . Seizures (HCC) 10/25/2016    Allergies  Allergen Reactions  . Keppra [Levetiracetam] Other (See Comments)    Reaction:  Headaches     Prior to Admission medications   Medication Sig Start Date End Date Taking? Authorizing Provider  carbamide peroxide (DEBROX) 6.5 % otic solution Place 5 drops into both ears 2 (two) times daily as needed. 10/30/16  Yes Bing NeighborsHarris, Sharni Negron S, FNP  lacosamide (VIMPAT) 200 MG TABS tablet Take 1 tablet (200 mg total) by mouth 2 (two) times daily. 12/10/16  Yes Penumalli, Glenford BayleyVikram R, MD  ondansetron (ZOFRAN) 4 MG tablet Take 2 tablets (8 mg total) by mouth every 8 (eight) hours as needed for nausea or vomiting. 11/26/16  Yes Danis, Andreas BlowerHenry L III, MD  SUMAtriptan Willette Brace(IMITREX)  100 MG tablet Take 1 tablet (100 mg total) by mouth every 2 (two) hours as needed for migraine. May repeat in 2 hours if headache persists or recurs. 08/13/16  Yes Penumalli, Glenford Bayley, MD  topiramate (TOPAMAX) 100 MG tablet Take 1 tablet (100 mg total) by mouth 2 (two) times daily. 11/28/16  Yes Penumalli, Glenford Bayley, MD    Past Medical, Surgical Family and Social History reviewed and updated.    Objective:   Today's Vitals   12/12/16 0940  BP: 125/74  Pulse: 88  Resp: 16  Temp: 98.4 F (36.9 C)  TempSrc: Oral  SpO2: 100%  Weight: 136 lb (61.7 kg)  Height: 5\' 8"  (1.727 m)   Wt Readings from Last 3 Encounters:  12/12/16 136 lb (61.7 kg)  12/11/16 136 lb (61.7 kg)  11/26/16 133 lb (60.3 kg)    Physical Exam  Constitutional: She is oriented to person, place, and time. She appears well-developed and well-nourished.  HENT:  Head: Normocephalic and atraumatic.  Right Ear: External ear normal.  Left Ear: External ear normal.  Nose: Nose normal.  Mouth/Throat: Oropharynx is clear and moist.  Negative of any URI symptoms   Eyes: Conjunctivae and EOM are normal. Pupils are equal, round, and reactive to light.  Neck: Normal range of motion. Neck supple.  Cardiovascular: Normal rate, regular rhythm, normal heart sounds and intact distal pulses.   Pulmonary/Chest: Effort normal and breath sounds normal.  Abdominal: Soft. Bowel sounds are normal. She exhibits no distension and no mass. There is no tenderness. There is no rebound and no guarding.  Musculoskeletal: Normal range of motion.  Neurological: She is alert and oriented to person, place, and time. She displays normal reflexes. No cranial nerve deficit. She exhibits normal muscle tone. Coordination normal.  Skin: Skin is warm and dry.  Psychiatric: She has a normal mood and affect. Her behavior is normal. Judgment and thought content normal.     Assessment & Plan:  1. Numbness and tingling - CBC with Differential - COMPLETE METABOLIC PANEL WITH GFR -If labs are unremarkable, advised patient to discuss with Dr. Marjory Lies 's follow-up visit. -She is has no focal weakness or abnormal or concerning neurological symptoms. -Advised to hydrate and frequent positional changes.  2. Acne  -Apply Differin Gel daily at bedtime to affected areas of the face.  -Patient is displaying significant worsening of psychosomatic symptoms. I strongly encouraged patient to  -Readout to [facility] for further evaluation of mental health needs.    Godfrey Pick. Tiburcio Pea, MSN, FNP-C [facility]  [address] [place], [place] [address] [phone]

## 2016-12-12 NOTE — Patient Instructions (Addendum)
For cough and sore throat: Take antihistamine.   I recommend cetirizine 10 mg daily, however, you can purchase over the counter antihistamines that you prefer.  Take Miralax instead of exlax as this is more suitable for your digestive system.  Please follow-up with Dr. Marjory LiesPenumalli regarding visual disturbances and numbness and tingling.  Checking  labs to ensure your electrolyte status is stable.  Please follow-up with [facility] for inquiry as to whether a mood stabilizer is appropriate to manage your anxiety and  depression.

## 2016-12-13 ENCOUNTER — Telehealth: Payer: Self-pay | Admitting: Diagnostic Neuroimaging

## 2016-12-13 LAB — CYTOLOGY - PAP
ADEQUACY: ABSENT
CANDIDA VAGINITIS: NEGATIVE
CHLAMYDIA, DNA PROBE: NEGATIVE
DIAGNOSIS: NEGATIVE
Neisseria Gonorrhea: NEGATIVE
TRICH (WINDOWPATH): NEGATIVE

## 2016-12-13 NOTE — Telephone Encounter (Signed)
Called patient who stated her tooth pain is very bad. She stated her PCP wouldn't prescribe anything for it, stating she didn't want patient to have another seizure. This RN advised patient that Dr Marjory LiesPenumalli does not prescribe narcotics. Patient stated she is taking Tylenol and not asking for a narcotic. She asked for an antibiotic. This RN advised her that Dr Marjory LiesPenumalli would not prescribe an antibiotic because he would not know exactly what the problem is with her tooth. Advised she needs to see a dentist. Patient stated she has been calling around, and they are too expensive. She stated she cannot even afford to go to an Urgent Care. This RN suggested she call [facility] and ask who they would recommend she call. Also suggested she call [facility]. She stated she called them this morning and was told they only help with dental problems in children and pregnant women. This RN suggested she call back and ask if they could recommend a dentist to her. Suggested she gargle with warm salt water; she stated she is. Suggested she could try OTC mouthwash but that she really needs to see a dentist. Patient verbalized understanding, appreciation of call back.

## 2016-12-13 NOTE — Telephone Encounter (Signed)
Pt would like to discuss something with Pincus SanesMary C, she is asking to be called back

## 2016-12-16 ENCOUNTER — Encounter: Payer: Self-pay | Admitting: Family Medicine

## 2016-12-16 ENCOUNTER — Other Ambulatory Visit: Payer: Self-pay | Admitting: *Deleted

## 2016-12-16 MED ORDER — LACOSAMIDE 200 MG PO TABS: 200.0000 mg | ORAL_TABLET | Freq: Two times a day (BID) | ORAL | 3 refills | Status: DC

## 2016-12-16 MED ORDER — LACOSAMIDE 200 MG PO TABS: 200.0000 mg | ORAL_TABLET | Freq: Two times a day (BID) | ORAL | 1 refills | Status: DC

## 2016-12-16 NOTE — Telephone Encounter (Addendum)
Kelly/[facility] [phone] said they rec'd the RX from 5/29 but needs new qty RX for 90day supply with 1 refill. Thank you

## 2016-12-16 NOTE — Telephone Encounter (Signed)
Faxed over request for 90 day supply x 1-R

## 2016-12-16 NOTE — Progress Notes (Signed)
Please print and mail to patient

## 2016-12-17 ENCOUNTER — Telehealth: Payer: Self-pay | Admitting: Diagnostic Neuroimaging

## 2016-12-17 LAB — CERVICOVAGINAL ANCILLARY ONLY: Herpes: NEGATIVE

## 2016-12-17 NOTE — Telephone Encounter (Signed)
Drema PryShayla with [facility] called office to advise they do not dispense lacosamide (VIMPAT) 200 MG TABS tablet.

## 2016-12-17 NOTE — Telephone Encounter (Signed)
Spoke with patient & she received a call from her medication asstiance program called on 6/4 to notify her that her medication should arrive by 6/6

## 2017-01-06 ENCOUNTER — Ambulatory Visit: Payer: Self-pay | Admitting: Family Medicine

## 2017-01-07 ENCOUNTER — Ambulatory Visit: Payer: Medicaid Other | Admitting: Diagnostic Neuroimaging

## 2017-01-09 ENCOUNTER — Emergency Department (HOSPITAL_COMMUNITY)
Admission: EM | Admit: 2017-01-09 | Discharge: 2017-01-09 | Disposition: A | Discharge status: Home / Self Care | Payer: Medicaid Other | Attending: Emergency Medicine | Admitting: Emergency Medicine

## 2017-01-09 ENCOUNTER — Telehealth: Payer: Self-pay | Admitting: Diagnostic Neuroimaging

## 2017-01-09 ENCOUNTER — Encounter (HOSPITAL_COMMUNITY): Payer: Self-pay | Admitting: *Deleted

## 2017-01-09 DIAGNOSIS — Z79899 Other long term (current) drug therapy: Secondary | ICD-10-CM | POA: Insufficient documentation

## 2017-01-09 DIAGNOSIS — R51 Headache: Secondary | ICD-10-CM | POA: Insufficient documentation

## 2017-01-09 DIAGNOSIS — R569 Unspecified convulsions: Secondary | ICD-10-CM | POA: Insufficient documentation

## 2017-01-09 DIAGNOSIS — R519 Headache, unspecified: Secondary | ICD-10-CM

## 2017-01-09 DIAGNOSIS — F17210 Nicotine dependence, cigarettes, uncomplicated: Secondary | ICD-10-CM | POA: Insufficient documentation

## 2017-01-09 LAB — BASIC METABOLIC PANEL
Anion gap: 5 (ref 5–15)
BUN: 8 mg/dL (ref 6–20)
CO2: 23 mmol/L (ref 22–32)
Calcium: 9.2 mg/dL (ref 8.9–10.3)
Chloride: 114 mmol/L — ABNORMAL HIGH (ref 101–111)
Creatinine, Ser: 0.7 mg/dL (ref 0.44–1.00)
GFR calc Af Amer: >60 mL/min (ref >60)
GFR calc non Af Amer: >60 mL/min (ref >60)
Glucose, Bld: 96 mg/dL (ref 65–99)
Potassium: 3.4 mmol/L — ABNORMAL LOW (ref 3.5–5.1)
Sodium: 142 mmol/L (ref 135–145)

## 2017-01-09 MED ORDER — LACOSAMIDE 50 MG PO TABS
200.0000 mg | ORAL_TABLET | Freq: Two times a day (BID) | ORAL | Status: DC
  Administered 2017-01-09: 200 mg via ORAL
  Filled 2017-01-09: qty 4

## 2017-01-09 MED ORDER — KETOROLAC TROMETHAMINE 15 MG/ML IJ SOLN
15.0000 mg | Freq: Once | INTRAMUSCULAR | Status: AC
  Administered 2017-01-09: 15 mg via INTRAVENOUS
  Filled 2017-01-09: qty 1

## 2017-01-09 MED ORDER — TOPIRAMATE 100 MG PO TABS
100.0000 mg | ORAL_TABLET | Freq: Two times a day (BID) | ORAL | Status: DC
  Administered 2017-01-09: 100 mg via ORAL
  Filled 2017-01-09: qty 1

## 2017-01-09 MED ORDER — DIPHENHYDRAMINE HCL 50 MG/ML IJ SOLN
25.0000 mg | Freq: Once | INTRAMUSCULAR | Status: AC
  Administered 2017-01-09: 25 mg via INTRAVENOUS
  Filled 2017-01-09: qty 1

## 2017-01-09 MED ORDER — SODIUM CHLORIDE 0.9 % IV BOLUS (SEPSIS)
1000.0000 mL | Freq: Once | INTRAVENOUS | Status: AC
  Administered 2017-01-09: 1000 mL via INTRAVENOUS

## 2017-01-09 MED ORDER — PROCHLORPERAZINE EDISYLATE 5 MG/ML IJ SOLN
5.0000 mg | Freq: Once | INTRAMUSCULAR | Status: AC
  Administered 2017-01-09: 5 mg via INTRAVENOUS
  Filled 2017-01-09: qty 2

## 2017-01-09 NOTE — Telephone Encounter (Signed)
Spoke with patient and advised her Dr Marjory LiesPenumalli wants to see her in 2-4 weeks, or she may see a NP. Patient questioned what her co pay would be. This RN transferred call to StephanAngie, billing dept.  Patient will ask to be transferred back after speaking with Angie. Call transferred back. Patient is to bring [facility] financial assistance paper to office when she receives it. Billing will then let her know her co pay amount. Patient wanted to go ahead and schedule FU; advised she arrive 30 minutes early to check in. Advised she continue taking seizure medication as Dr Marjory LiesPenumalli prescribed. Advised she call before FU for any questions, problems. She verbalized understanding, appreciation of call back.

## 2017-01-09 NOTE — ED Provider Notes (Signed)
[facility]-EMERGENCY DEPT Provider Note   CSN: [id number] Arrival date & time: 01/09/17  0808     History   Chief Complaint Chief Complaint  Patient presents with  . Seizures    HPI Lisa Shaw is a 26 y.o. female.  HPI   26yF presenting after she felt she may have a had a seizure last night or this morning. Went to bed in usual state of health. Woke up shortly before arrival and tongue felt sore/"weird" and she had a headache. She reports she has grand-mal seizures a few times per year which often happen in her sleep and "staring spells" a couple times per day. She is followed by a neurologist and reports compliance with her medications. She feels like she has been getting adequate sleep. She denies ETOH use. No fever or chills. On ROS she has quite a few other complaints of varying chronicity and I cannot clearly relate them to her presenting concerns. She does seem anxious and is crying at times.   Past Medical History:  Diagnosis Date  . Epilepsy (HCC)   . Migraine   . Seizures (HCC) 07/18/2016    Patient Active Problem List   Diagnosis Date Noted  . Abdominal pain 10/29/2016  . Seizures (HCC) 10/25/2016    History reviewed. No pertinent surgical history.  OB History    No data available       Home Medications    Prior to Admission medications   Medication Sig Start Date End Date Taking? Authorizing Provider  adapalene (DIFFERIN) 0.1 % gel Apply topically at bedtime. 12/12/16   Bing Neighbors, FNP  carbamide peroxide (DEBROX) 6.5 % otic solution Place 5 drops into both ears 2 (two) times daily as needed. 10/30/16   Bing Neighbors, FNP  lacosamide (VIMPAT) 200 MG TABS tablet Take 1 tablet (200 mg total) by mouth 2 (two) times daily. 12/16/16   Penumalli, Glenford Bayley, MD  ondansetron (ZOFRAN) 4 MG tablet Take 2 tablets (8 mg total) by mouth every 8 (eight) hours as needed for nausea or vomiting. 11/26/16   Danis, Andreas Blower, MD  SUMAtriptan (IMITREX) 100 MG  tablet Take 1 tablet (100 mg total) by mouth every 2 (two) hours as needed for migraine. May repeat in 2 hours if headache persists or recurs. 08/13/16   Penumalli, Glenford Bayley, MD  topiramate (TOPAMAX) 100 MG tablet Take 1 tablet (100 mg total) by mouth 2 (two) times daily. 11/28/16   Penumalli, Glenford Bayley, MD    Family History Family History  Problem Relation Age of Onset  . Seizures Other     Social History Social History  Substance Use Topics  . Smoking status: Current Every Day Smoker    Types: Cigarettes  . Smokeless tobacco: Never Used     Comment: 1/30 avg 2/day  . Alcohol use No     Allergies   Keppra [levetiracetam]   Review of Systems Review of Systems  All systems reviewed and negative, other than as noted in HPI.  Physical Exam Updated Vital Signs BP 139/87 (BP Location: Right Arm)   Pulse 92   Temp 98.1 F (36.7 C) (Oral)   Resp 16   Ht 5\' 8"  (1.727 m)   Wt 61.7 kg (136 lb)   LMP 12/29/2016   SpO2 100%   BMI 20.68 kg/m   Physical Exam  Constitutional: She is oriented to person, place, and time. She appears well-developed and well-nourished. No distress.  HENT:  Head: Normocephalic and  atraumatic.  No intraoral trauma noted  Eyes: Conjunctivae and EOM are normal. Pupils are equal, round, and reactive to light. Right eye exhibits no discharge. Left eye exhibits no discharge.  Neck: Normal range of motion. Neck supple.  No nuchal rigidity  Cardiovascular: Normal rate, regular rhythm and normal heart sounds.  Exam reveals no gallop and no friction rub.   No murmur heard. Pulmonary/Chest: Effort normal and breath sounds normal. No respiratory distress.  Abdominal: Soft. She exhibits no distension. There is no tenderness.  Musculoskeletal: She exhibits no edema or tenderness.  Neurological: She is alert and oriented to person, place, and time. No cranial nerve deficit.  Skin: Skin is warm and dry.  Psychiatric:  Anxious, crying at times, depressed mood    Nursing note and vitals reviewed.    ED Treatments / Results  Labs (all labs ordered are listed, but only abnormal results are displayed) Labs Reviewed - No data to display  EKG  EKG Interpretation None       Radiology No results found.  Procedures Procedures (including critical care time)  Medications Ordered in ED Medications - No data to display   Initial Impression / Assessment and Plan / ED Course  I have reviewed the triage vital signs and the nursing notes.  Pertinent labs & imaging results that were available during my care of the patient were reviewed by me and considered in my medical decision making (see chart for details).     26yF with possible seizure and a headache. She has a history of seizures which apparently usually happen at night. No oral trauma noted. Her neuro exam is nonfocal. She is afebrile and HD stable.  She has a history of what she calls migraines and no features have me particularly concerned for emergent secondary cause of her head ache such as bleed, infectious, venous thrombosis, acute glaucoma, etc. Will treat her headache. Will check basic labs because of possible seizure but describe activity sounds consistent with her past history.  On further review of records, I actually saw her a couple months ago for abdominal pain.  She seemed to perseverate/obsess on various things (her blood pressure, very minor lab abnormalities, etc). It was a frustrating encounter for both of us.  I remember the encounter vividly. An elderly woman with a HR in the 30s and symptomatic was assigned to her room once she was put up for discharge. I found it very difficult to reassure her when going over her discharge plan. She had continual questions beyond what I could address in a few minutes and eventually I told she had to leave and follow-up with surgery and PCP.  I think there is a significant anxiety and/or depression component. She talks about how the sun  bothers her eyes to the point that she doesn't go out much. She is tearful. She may benefit from someone addressing what seems to me like anxiety and some obsession/compulsive behaviors.   Final Clinical Impressions(s) / ED Diagnoses   Final diagnoses:  Seizure (HCC)  Nonintractable headache, unspecified chronicity pattern, unspecified headache type    New Prescriptions New Prescriptions   No medications on file     Raeford RazorKohut, Jonte Wollam, MD 01/18/17 1802

## 2017-01-09 NOTE — ED Triage Notes (Signed)
Patient is alert and oriented with some confusion.  Patient states that when she woke up this morning she felt her tongue was feeling different and she thinks that she had a seizure in her sleep.  Patient states that she has a head ache that she rates 6 of 10

## 2017-01-09 NOTE — ED Notes (Signed)
Kohut, MD made aware pt has not taken seizure medication this morning and request to take.

## 2017-01-09 NOTE — Telephone Encounter (Signed)
I can see patient in next 2-4 weeks. Also may see NP. -VRP

## 2017-01-09 NOTE — Telephone Encounter (Signed)
Patient called office to notify Dr. Marjory LiesPenumalli she had a seizure in her sleep last night [facility].

## 2017-01-22 ENCOUNTER — Encounter: Payer: Self-pay | Admitting: Diagnostic Neuroimaging

## 2017-01-22 ENCOUNTER — Ambulatory Visit (INDEPENDENT_AMBULATORY_CARE_PROVIDER_SITE_OTHER): Payer: Self-pay | Admitting: Diagnostic Neuroimaging

## 2017-01-22 VITALS — BP 123/77 | HR 98 | Ht 68.0 in | Wt 134.0 lb

## 2017-01-22 DIAGNOSIS — G43009 Migraine without aura, not intractable, without status migrainosus: Secondary | ICD-10-CM

## 2017-01-22 DIAGNOSIS — R202 Paresthesia of skin: Secondary | ICD-10-CM

## 2017-01-22 DIAGNOSIS — R200 Anesthesia of skin: Secondary | ICD-10-CM

## 2017-01-22 DIAGNOSIS — G40909 Epilepsy, unspecified, not intractable, without status epilepticus: Secondary | ICD-10-CM

## 2017-01-22 MED ORDER — DIVALPROEX SODIUM 250 MG PO DR TAB: 250.0000 mg | DELAYED_RELEASE_TABLET | Freq: Two times a day (BID) | ORAL | 6 refills | Status: DC

## 2017-01-22 NOTE — Patient Instructions (Signed)
-   start depakote 250mg  twice a day  - continue topiramate 100mg  twice a day  - continue vimpat 50mg  twice a day for 1 week, then increase to vimpat 100mg  twice a day  - follow up with psychiatry Vesta Mixer([facility])  - follow up with [facility] epilepsy clinic (for intractable epilepsy)

## 2017-01-22 NOTE — Progress Notes (Signed)
[facility]  PATIENT: Lisa Shaw DOB: January 27, 1991  PCP: Colin Mulders [facility]) HISTORY FROM: patient and mother  REASON FOR VISIT: follow up   HISTORICAL  CHIEF COMPLAINT:  Chief Complaint  Patient presents with  . Follow-up  . Seizures    01/09/2017 ED nocturnal sz, no missed doses of sz meds.  has c/o tngling numbness hands and legs  . Migraine    HISTORY OF PRESENT ILLNESS:   UPDATE 01/22/17: Since last visit, patient continues with staring spells, and poss nocturnal seizures. Continues with nocturnal seizure grand mal (1 every 2-3 month) and minor staring spells in daytime (~3 per month). Had ER visit on 01/09/17 for poss nocturnal seizure. She is worried, anxious, frightened. She worries about dying in her sleep. She feels hopeless. Has been to psychiatry, but last visit in May 2018. Not gone back. Not tried meds for depression or anxiety.   UPDATE 11/04/16: Since last visit, has had multiple visits to ER for abd pain, possibly ovarian cyst rupture. Also had seizure on 10/28/16, went to ER. Now finally on vimpat since 11/02/16. Struggling with stress, sleep issues, diff obtaining financial assistance.   NEW HPI (08/13/16, VRP): 26 year old female here for evaluation of seizures. Patient had first onset of seizure in 2013. I saw patient in 2014 (see note below). Apparently she was having intermittent staring spells for a few months and then had a generalized convulsive seizure. Initially she was started on Keppra but this caused mood and agitation problems. At some point this was switched to topiramate. Patient continues to have generalized convulsive seizures, approximate 5 times per year. Convulsive seizures associated with tongue biting and incontinence. Also having intermittent staring spells twice per day. Patient has been followed recently at [facility] epilepsy clinic. Patient also has intermittent headaches, bilateral sharp sensations without associated  symptoms.  PRIOR HPI (09/09/14, Amedeo Kinsman, MD; [facility]): "Seizures: Dec 2013 was first one. She has had a total of 5-6. Last was May 09, 2013. Referred from [facility] for evaluation of spells concerning for seizures. Seizures are described as tonic posturing in the UE and LE with rthymic diffuse jerking with eye rolling backwards. Left frontal sharps on EEG from 06/2012. Prior to this in 2012, she started having starring episodes (lasting ~2 min) associated with diaphoresis with post-ictal confusion with violence. These only occur at night or early in the morning. There was evidence of tongue biting. No shaking or jerks noted interictally. During the episode the patient was responsive/not responsive. The patient has had urinary incontinence. No ETOH use. She now gets about 6-7 hours of sleep a night (previously while working she got 4-5 hours. No increased levels of stress. No illegal drug use. Has issues with deja vu, and possibly tingling while she has the staring episodes or before the large ones. The patient is currently on Keppra 1000 bid (increased Sept 2014) and has had irritation and grumpiness. No history of TBIs/meningitis, encephalitis/personal history of MR (completed 12 th grade on time), febrile seizures. The patient has had an MRI of her brain (reviewed report and this showed motion artifact but no large area of abnormality). The patient has had an EEG (showing left frontal sharps).The patient does not drive. She had been unable to keep a job 2/2 seizures and medication side effects. Per patient, no history of psychiatric issues. Family history of seizures in maternal grandmother (all her life). On Keppra, violent with mood disturbances so this was discontinued in 2015 and she was  started on Topamax which was initially not tolerated 2/2 mental slowing but she now tolerates this and is on Topamax 100 mg qhs. Headaches: Has had headaches since 2012. Headaches b/l, frontal, and sharp. Has to  take Advil or BC. Has these 50-70% of month. Not related to menses. Not associated with weather changes or foods or deprivation. Has diplopia with the headaches. Topamax caused medication side effects of mental slowing even at low doses which did not improve with time."  PRIOR HPI (09/23/12, VRP): 26 year old right-handed female here for evaluation of seizure. 06/19/2012, patient was asleep, when all of a sudden she had convulsions, tongue biting and incontinence. Convulsions lasted for 2 minutes and were witnessed by a friend. Paramedics were called to the home. Patient was confused and combative after the seizure. She is amnestic to events before and after the seizure. Patient went to the hospital, had MRI, EEG, which showed some left frontal output from activity, and was discharged on levetiracetam. Patient ran out of her medications in early January 2014. 07/20/2012, patient had a second seizure, at night while asleep. This was unwitnessed the patient woke up feeling funny and with urinary incontinence. Patient went to the hospital she was given a prescription for antiseizure medication. Since that time patient had no further seizures. She has noted some mood irritability, dizziness and insomnia since starting levetiracetam. Patient was born full-term. Patient's mother was hospitalized last month of pregnancy due to preterm labor. Patient developed normally. No history of head trauma, meningitis or encephalitis. Only family history seizure is on patient's great grandmother on mother's side.    REVIEW OF SYSTEMS: Full 14 system review of systems performed and negative with exception of: memory loss dizziness depression anxiety hallucinations hyperactive excess thirst chills fatigue fever activity change facial drooping chest pain shortness of breath drooling pain cramps neck pain double vision.   ALLERGIES: Allergies  Allergen Reactions  . Keppra [Levetiracetam] Other (See Comments)    Reaction:   Headaches     HOME MEDICATIONS: Outpatient Medications Prior to Visit  Medication Sig Dispense Refill  . acetaminophen (TYLENOL) 500 MG tablet Take 1,000 mg by mouth every 6 (six) hours as needed for mild pain, moderate pain, fever or headache.    . carbamide peroxide (DEBROX) 6.5 % otic solution Place 5 drops into both ears 2 (two) times daily as needed. (Patient taking differently: Place 5 drops into both ears 2 (two) times daily as needed (for earwax removal). ) 15 mL 0  . lacosamide (VIMPAT) 200 MG TABS tablet Take 1 tablet (200 mg total) by mouth 2 (two) times daily. 180 tablet 1  . ondansetron (ZOFRAN) 4 MG tablet Take 2 tablets (8 mg total) by mouth every 8 (eight) hours as needed for nausea or vomiting. 45 tablet 1  . SUMAtriptan (IMITREX) 100 MG tablet Take 1 tablet (100 mg total) by mouth every 2 (two) hours as needed for migraine. May repeat in 2 hours if headache persists or recurs. 10 tablet 1  . topiramate (TOPAMAX) 100 MG tablet Take 1 tablet (100 mg total) by mouth 2 (two) times daily. 60 tablet 11   No facility-administered medications prior to visit.     PAST MEDICAL HISTORY: Past Medical History:  Diagnosis Date  . Epilepsy (HCC)   . Migraine   . Seizures (HCC) 07/18/2016    PAST SURGICAL HISTORY: No past surgical history on file.  FAMILY HISTORY: Family History  Problem Relation Age of Onset  . Seizures Other  SOCIAL HISTORY:  Social History   Social History  . Marital status: Single    Spouse name: N/A  . Number of children: 0  . Years of education: 12th   Occupational History  .      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 Smoking status: Current Every Day Smoker    Types: Cigarettes  . Smokeless tobacco: Never Used     Comment: 1/30 avg 2/day  . Alcohol use No  . Drug use: Yes    Types: Marijuana     Comment: 1/30 avg 1-2 x daily  . Sexual activity: Yes   Other Topics Concern  . Not on file   Social History  Narrative   Patient lives at home with her mother.   Caffeine Use: sodas daily     PHYSICAL EXAM  GENERAL EXAM/CONSTITUTIONAL: Vitals:  Vitals:   01/22/17 0953  BP: 123/77  Pulse: 98  Weight: 134 lb (60.8 kg)  Height: 5\' 8"  (1.727 m)   Body mass index is 20.37 kg/m. No exam data present  Patient is in IN SEVERE EMOTIONAL DISTRESS  TEARFUL  CRYING  CARDIOVASCULAR:  Examination of carotid arteries is normal; no carotid bruits  Regular rate and rhythm, no murmurs  Examination of peripheral vascular system by observation and palpation is normal  EYES:  Ophthalmoscopic exam of optic discs and posterior segments is normal; no papilledema or hemorrhages  MUSCULOSKELETAL:  Gait, strength, tone, movements noted in Neurologic exam below  NEUROLOGIC: MENTAL STATUS:  No flowsheet data found.  awake, alert, oriented to person, place and time  recent and remote memory intact  normal attention and concentration  language fluent, comprehension intact, naming intact  fund of knowledge appropriate  CRANIAL NERVE:   2nd - no papilledema on fundoscopic exam  2nd, 3rd, 4th, 6th - pupils equal and reactive to light, visual fields full to confrontation, extraocular muscles intact, no nystagmus  5th - facial sensation symmetric  7th - facial strength symmetric  8th - hearing intact  9th - palate elevates symmetrically, uvula midline  11th - shoulder shrug symmetric  12th - tongue protrusion midline  MOTOR:   normal bulk and tone, GENERALIZED WEAKNESS (4) in the BUE, BLE  SENSORY:   normal and symmetric to light touch  COORDINATION:   finger-nose-finger, fine finger movements normal  REFLEXES:   deep tendon reflexes present and symmetric  GAIT/STATION:   narrow based gait    DIAGNOSTIC DATA (LABS, IMAGING, TESTING) - I reviewed patient records, labs, notes, testing and imaging myself where available.  Lab Results  Component Value Date   WBC  6.8 12/12/2016   HGB 12.9 12/12/2016   HCT 40.8 12/12/2016   MCV 105.2 (H) 12/12/2016   PLT 286 12/12/2016      Component Value Date/Time   NA 142 01/09/2017 0910   K 3.4 (L) 01/09/2017 0910   CL 114 (H) 01/09/2017 0910   CO2 23 01/09/2017 0910   GLUCOSE 96 01/09/2017 0910   BUN 8 01/09/2017 0910   CREATININE 0.70 01/09/2017 0910   CREATININE 0.72 12/12/2016 1019   CALCIUM 9.2 01/09/2017 0910   PROT 6.9 12/12/2016 1019   ALBUMIN 4.2 12/12/2016 1019   AST 14 12/12/2016 1019   ALT 12 12/12/2016 1019   ALKPHOS 78 12/12/2016 1019   BILITOT 0.3 12/12/2016 1019   GFRNONAA >60 01/09/2017 0910   GFRNONAA >89 12/12/2016 1019   GFRAA >60 01/09/2017 0910   GFRAA >89  12/12/2016 1019   No results found for: CHOL, HDL, LDLCALC, LDLDIRECT, TRIG, CHOLHDL No results found for: ZOXW9UHGBA1C No results found for: VITAMINB12 Lab Results  Component Value Date   TSH 1.51 10/25/2016     06/19/12 MRI brain [I reviewed images myself and agree with interpretation. -VRP]  - Images are degraded by motion.  Allowing for this no mass or infarct is identified.  Lateral ventricles are mildly prominent but may be within normal limits.  06/19/12 EEG  - This is an abnormal EEG secondary to focal sharp wave activity in the frontal region on the left.  This finding is suggestive of a focal disturbance with epileptogenic potential.   10/21/16 CT abd/pelvis - Ovarian cystic change as well as minimal free pelvic fluid. This may be related to recent ovarian cyst rupture. - No other focal abnormality is seen.  10/22/16 u/s RUQ - Cholelithiasis without sonographic evidence of acute cholecystitis.     ASSESSMENT AND PLAN  26 y.o. year old female here with seizure disorder since 2013, with abnormal EEG showing left frontal sharp wave activity. Continues with nocturnal seizure grand mal (1 every 2-3 month) and minor staring spells in daytime (~3 per month).  Also with intermittent headaches.   Also with limited  financial and health insurance resources.   Also with intermittent abdominal pain.   Also with severe anxiety and depression ([facility]    Dx:  1. Seizure disorder (HCC)   2. Migraine without aura and without status migrainosus, not intractable   3. Numbness and tingling       PLAN:  I spent 45 minutes of face to face time with patient. Greater than 50% of time was spent in counseling and coordination of care with patient. In summary we discussed:   - start depakote 250mg  twice a day  - continue topiramate 100mg  twice a day  - continue vimpat 50mg  twice a day for 1 week, then increase to vimpat 100mg  twice a day  - follow up with psychiatry Vesta Mixer([facility])  - follow up with [facility] epilepsy clinic (for intractable epilepsy)   Meds ordered this encounter  Medications  . divalproex (DEPAKOTE) 250 MG DR tablet    Sig: Take 1 tablet (250 mg total) by mouth 2 (two) times daily.    Dispense:  60 tablet    Refill:  6   Meds ordered this encounter  Medications  . divalproex (DEPAKOTE) 250 MG DR tablet    Sig: Take 1 tablet (250 mg total) by mouth 2 (two) times daily.    Dispense:  60 tablet    Refill:  6   Return in about 4 months (around 05/25/2017).     Suanne MarkerVIKRAM R. Walter Min, MD 01/22/2017, 10:36 AM Certified in Neurology, Neurophysiology and Neuroimaging  [facility] [address], [address] [place], [place] [address] [phone]

## 2017-01-23 ENCOUNTER — Other Ambulatory Visit (INDEPENDENT_AMBULATORY_CARE_PROVIDER_SITE_OTHER): Payer: Self-pay

## 2017-01-23 DIAGNOSIS — Z0289 Encounter for other administrative examinations: Secondary | ICD-10-CM

## 2017-01-24 LAB — IRON AND TIBC
IRON: 135 ug/dL (ref 27–159)
Iron Saturation: 43 % (ref 15–55)
Total Iron Binding Capacity: 312 ug/dL (ref 250–450)
UIBC: 177 ug/dL (ref 131–425)

## 2017-01-24 LAB — CBC WITH DIFFERENTIAL/PLATELET
Basophils Absolute: 0 10*3/uL (ref 0.0–0.2)
Basos: 1 %
EOS (ABSOLUTE): 0 10*3/uL (ref 0.0–0.4)
EOS: 1 %
HEMATOCRIT: 43 % (ref 34.0–46.6)
HEMOGLOBIN: 14.1 g/dL (ref 11.1–15.9)
IMMATURE GRANS (ABS): 0 10*3/uL (ref 0.0–0.1)
IMMATURE GRANULOCYTES: 1 %
LYMPHS: 41 %
Lymphocytes Absolute: 2.6 10*3/uL (ref 0.7–3.1)
MCH: 33.7 pg — ABNORMAL HIGH (ref 26.6–33.0)
MCHC: 32.8 g/dL (ref 31.5–35.7)
MCV: 103 fL — AB (ref 79–97)
Monocytes Absolute: 0.4 10*3/uL (ref 0.1–0.9)
Monocytes: 6 %
Neutrophils Absolute: 3.3 10*3/uL (ref 1.4–7.0)
Neutrophils: 50 %
Platelets: 283 10*3/uL (ref 150–379)
RBC: 4.19 x10E6/uL (ref 3.77–5.28)
RDW: 12.7 % (ref 12.3–15.4)
WBC: 6.4 10*3/uL (ref 3.4–10.8)

## 2017-01-24 LAB — COMPREHENSIVE METABOLIC PANEL
ALBUMIN: 4.7 g/dL (ref 3.5–5.5)
ALT: 16 IU/L (ref 0–32)
AST: 15 IU/L (ref 0–40)
Albumin/Globulin Ratio: 1.8 (ref 1.2–2.2)
Alkaline Phosphatase: 79 IU/L (ref 39–117)
BUN/Creatinine Ratio: 15 (ref 9–23)
BUN: 13 mg/dL (ref 6–20)
Bilirubin Total: 0.3 mg/dL (ref 0.0–1.2)
CALCIUM: 9.9 mg/dL (ref 8.7–10.2)
CO2: 14 mmol/L — ABNORMAL LOW (ref 20–29)
Chloride: 109 mmol/L — ABNORMAL HIGH (ref 96–106)
Creatinine, Ser: 0.87 mg/dL (ref 0.57–1.00)
GFR, EST AFRICAN AMERICAN: 107 mL/min/{1.73_m2} (ref >59)
GFR, EST NON AFRICAN AMERICAN: 93 mL/min/{1.73_m2} (ref >59)
GLOBULIN, TOTAL: 2.6 g/dL (ref 1.5–4.5)
Glucose: 97 mg/dL (ref 65–99)
Potassium: 3.9 mmol/L (ref 3.5–5.2)
SODIUM: 142 mmol/L (ref 134–144)
TOTAL PROTEIN: 7.3 g/dL (ref 6.0–8.5)

## 2017-01-24 LAB — HEMOGLOBIN A1C
Est. average glucose Bld gHb Est-mCnc: 82 mg/dL
HEMOGLOBIN A1C: 4.5 % — AB (ref 4.8–5.6)

## 2017-01-24 LAB — FERRITIN: Ferritin: 124 ng/mL (ref 15–150)

## 2017-01-24 LAB — T4, FREE: Free T4: 1.21 ng/dL (ref 0.82–1.77)

## 2017-01-24 LAB — VITAMIN B12: Vitamin B-12: 405 pg/mL (ref 232–1245)

## 2017-01-24 LAB — TSH: TSH: 1.18 u[IU]/mL (ref 0.450–4.500)

## 2017-01-28 ENCOUNTER — Ambulatory Visit (INDEPENDENT_AMBULATORY_CARE_PROVIDER_SITE_OTHER): Payer: Self-pay | Admitting: Family Medicine

## 2017-01-28 ENCOUNTER — Other Ambulatory Visit (HOSPITAL_COMMUNITY)
Admission: RE | Admit: 2017-01-28 | Discharge: 2017-01-28 | Disposition: A | Discharge status: Home / Self Care | Payer: Medicaid Other | Source: Ambulatory Visit | Attending: Family Medicine | Admitting: Family Medicine

## 2017-01-28 VITALS — BP 112/72 | HR 90 | Temp 98.6°F | Resp 16 | Ht 68.0 in | Wt 137.0 lb

## 2017-01-28 DIAGNOSIS — B9689 Other specified bacterial agents as the cause of diseases classified elsewhere: Secondary | ICD-10-CM | POA: Insufficient documentation

## 2017-01-28 DIAGNOSIS — L709 Acne, unspecified: Secondary | ICD-10-CM

## 2017-01-28 DIAGNOSIS — N760 Acute vaginitis: Secondary | ICD-10-CM | POA: Insufficient documentation

## 2017-01-28 DIAGNOSIS — N898 Other specified noninflammatory disorders of vagina: Secondary | ICD-10-CM

## 2017-01-28 LAB — POCT URINALYSIS DIP (DEVICE)
BILIRUBIN URINE: NEGATIVE
GLUCOSE, UA: NEGATIVE mg/dL
Hgb urine dipstick: NEGATIVE
Ketones, ur: NEGATIVE mg/dL
LEUKOCYTES UA: NEGATIVE
NITRITE: NEGATIVE
PH: 6.5 (ref 5.0–8.0)
Protein, ur: NEGATIVE mg/dL
Specific Gravity, Urine: 1.02 (ref 1.005–1.030)
Urobilinogen, UA: 0.2 mg/dL (ref 0.0–1.0)

## 2017-01-28 MED ORDER — DOXYCYCLINE HYCLATE 100 MG PO CAPS
100.0000 mg | ORAL_CAPSULE | Freq: Two times a day (BID) | ORAL | 0 refills | Status: DC
Start: 2017-01-28 — End: 2017-04-15

## 2017-01-28 MED FILL — DOXYCYCLINE 100 MG TABLET: 100 | 10 days supply | Qty: 20 | Fill #0

## 2017-01-28 NOTE — Addendum Note (Signed)
Addended by: Lisabeth PickHENDRICKS, Kahla Risdon L on: 01/28/2017 10:18 AM   Modules accepted: Orders

## 2017-01-28 NOTE — Progress Notes (Signed)
Patient ID: Lisa Shaw, female    DOB: 04/07/1991, 26 y.o.   MRN: [id number]  PCP: Bing NeighborsHarris, Lisa Mateja S, FNP  Chief Complaint  Patient presents with  . Follow-up    3 MONTH  . Dysuria  . Rash    ON FACE    Subjective:  HPI Lisa Shaw is a 26 y.o. female presents for evaluation of dysuria and facial rash. Lisa Shaw reports a 2 week history of vaginal discharge, dysuria (since resolve), and vaginal burning. Denies any new sexual partners. Reports no associated abdominal pain with the exception of chronic right sided abdominal pain. Subjective fever and "feels cold" now. Lisa Shaw continues to express concern of a chronic acne that she has on her bilateral facial cheeks. She was previously evaluated and prescribed Differin gel, but reports today she never picked up the medication as it was too expensive. She is requesting a round of antibiotics for facial acne. She also reports that she has thrush on her tongue. Reports that she is able to get rid of thick white discharge by  rinsing out her mouth daily. She also requests a medication for thrush.  Social History   Social History  . Marital status: Single    Spouse name: N/A  . Number of children: 0  . Years of education: 12th   Occupational History  .      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 Smoking status: Current Every Day Smoker    Types: Cigarettes  . Smokeless tobacco: Never Used     Comment: 1/30 avg 2/day  . Alcohol use No  . Drug use: Yes    Types: Marijuana     Comment: 1/30 avg 1-2 x daily  . Sexual activity: Yes   Other Topics Concern  . Not on file   Social History Narrative   Patient lives at home with her mother.   Caffeine Use: sodas daily    Family History  Problem Relation Age of Onset  . Seizures Other      Review of Systems  Patient Active Problem List   Diagnosis Date Noted  . Abdominal pain 10/29/2016  . Seizures (HCC) 10/25/2016    Allergies   Allergen Reactions  . Keppra [Levetiracetam] Other (See Comments)    Reaction:  Headaches     Prior to Admission medications   Medication Sig Start Date End Date Taking? Authorizing Provider  acetaminophen (TYLENOL) 500 MG tablet Take 1,000 mg by mouth every 6 (six) hours as needed for mild pain, moderate pain, fever or headache.   Yes [provider]  carbamide peroxide (DEBROX) 6.5 % otic solution Place 5 drops into both ears 2 (two) times daily as needed. Patient taking differently: Place 5 drops into both ears 2 (two) times daily as needed (for earwax removal).  10/30/16  Yes Bing NeighborsHarris, Abdulraheem Pineo S, FNP  divalproex (DEPAKOTE) 250 MG DR tablet Take 1 tablet (250 mg total) by mouth 2 (two) times daily. 01/22/17  Yes Penumalli, Glenford BayleyVikram R, MD  lacosamide (VIMPAT) 200 MG TABS tablet Take 1 tablet (200 mg total) by mouth 2 (two) times daily. 12/16/16  Yes Penumalli, Glenford BayleyVikram R, MD  ondansetron (ZOFRAN) 4 MG tablet Take 2 tablets (8 mg total) by mouth every 8 (eight) hours as needed for nausea or vomiting. 11/26/16  Yes Danis, Starr LakeHenry L III, MD  SUMAtriptan (IMITREX) 100 MG tablet Take 1 tablet (100 mg total) by mouth every 2 (two) hours as needed  for migraine. May repeat in 2 hours if headache persists or recurs. 08/13/16  Yes Penumalli, Glenford Bayley, MD  topiramate (TOPAMAX) 100 MG tablet Take 1 tablet (100 mg total) by mouth 2 (two) times daily. 11/28/16  Yes Penumalli, Glenford Bayley, MD    Past Medical, Surgical Family and Social History reviewed and updated.    Objective:   Today'Shaw Vitals   01/28/17 0921  BP: 112/72  Pulse: 90  Resp: 16  Temp: 98.6 F (37 C)  TempSrc: Oral  SpO2: 96%  Weight: 137 lb (62.1 kg)  Height: 5\' 8"  (1.727 m)    Wt Readings from Last 3 Encounters:  01/28/17 137 lb (62.1 kg)  01/22/17 134 lb (60.8 kg)  01/09/17 136 lb (61.7 kg)   Physical Exam  Constitutional: She is oriented to person, place, and time. She appears well-developed and well-nourished.  HENT:   Head: Normocephalic and atraumatic.  Eyes: Pupils are equal, round, and reactive to light. Conjunctivae are normal.  Neck: Normal range of motion. Neck supple.  Cardiovascular: Normal rate, regular rhythm, normal heart sounds and intact distal pulses.   Pulmonary/Chest: Effort normal.  Abdominal: Soft. Bowel sounds are normal.  Genitourinary:  Genitourinary Comments: Self collected vaginal specimen.  Neurological: She is alert and oriented to person, place, and time.  Skin: Skin is warm and dry.  Psychiatric: Her mood appears anxious. Her affect is labile. Her speech is delayed. She is agitated. Thought content is paranoid and delusional. Cognition and memory are normal. She expresses inappropriate judgment. She exhibits a depressed mood.   Assessment & Plan:  1. Vaginal discharge - Cervicovaginal ancillary only  2. Acne, unspecified acne type -Doxycycline 100 mg twice daily 10 days for acne -Differin Gel or an equivalent generic is recommended for chronic maintenance of acne symptoms.   -Patient also reports that she has reached out to [facility] gastroenterology and they refuse to schedule an appointment for her continued chronic abdominal pain. In review of GI notes, she had be referred to [facility] surgery for evaluation of possible gallstone removal. She has no insurance and has not followed up.  -Patient continues to display behaviors associated with psychosomatic symptoms. She became very angry when I refused to prescribe a prescription for thrush. There was no visible evidence of any form of thrush in her oral mucosa or tongue. She requested to speak with my Development worker, international aid. Clinical manager notified.   -Patient was again recommended to follow up with [facility] behavioral health services for active anxiety and depression.   RTC: 6 months for wellness visit    Godfrey Pick. Tiburcio Pea, MSN, FNP-C [facility]  [address] [place],  [place] [address] [phone]

## 2017-01-28 NOTE — Patient Instructions (Addendum)
You will be notified of your vaginal specimen results.  I have prescribed Doxycyline 100 mg twice daily x 10 days for acne on face.  You can [facility] with [facility] for [facility] of your gallstones. [facility] has deferred treatment of your chronic abdominal pain to [facility].  As previously recommended, please follow-up with [facility] for anxiety # [phone].

## 2017-01-28 NOTE — Addendum Note (Signed)
Addended by: Lisabeth PickHENDRICKS, Janeene Sand L on: 01/28/2017 10:21 AM   Modules accepted: Orders

## 2017-01-29 LAB — CERVICOVAGINAL ANCILLARY ONLY
Bacterial vaginitis: POSITIVE — AB
CHLAMYDIA, DNA PROBE: NEGATIVE
Candida vaginitis: NEGATIVE
Neisseria Gonorrhea: NEGATIVE
Trichomonas: NEGATIVE

## 2017-01-30 MED ORDER — METRONIDAZOLE 500 MG PO TABS: 500.0000 mg | ORAL_TABLET | Freq: Two times a day (BID) | ORAL | 0 refills | Status: DC

## 2017-01-30 MED FILL — metroNIDAZOLE 500 MG TABS: 500 | 7 days supply | Qty: 14 | Fill #0

## 2017-02-03 ENCOUNTER — Telehealth: Payer: Self-pay | Admitting: Diagnostic Neuroimaging

## 2017-02-03 NOTE — Telephone Encounter (Signed)
Patient called office requesting lab results.  Please call °

## 2017-02-05 MED FILL — DIVALPROEX SOD DR 250 MG TA: 250 | 30 days supply | Qty: 60 | Fill #0

## 2017-02-06 ENCOUNTER — Telehealth: Payer: Self-pay | Admitting: *Deleted

## 2017-02-06 NOTE — Telephone Encounter (Signed)
LMVM for pt that her lab results were unremarkable per Dr. Marjory LiesPenumalli..  She is to call back if questions.  Continue current plan.

## 2017-02-06 NOTE — Telephone Encounter (Signed)
-----   Message from Suanne MarkerVikram R Penumalli, MD sent at 02/05/2017  5:22 PM EDT ----- Unremarkable labs. Continue current plan. Please call patient. -VRP

## 2017-02-11 DIAGNOSIS — Z9151 Personal history of suicidal behavior: Secondary | ICD-10-CM | POA: Insufficient documentation

## 2017-02-11 DIAGNOSIS — F322 Major depressive disorder, single episode, severe without psychotic features: Secondary | ICD-10-CM | POA: Insufficient documentation

## 2017-02-11 DIAGNOSIS — Z915 Personal history of self-harm: Secondary | ICD-10-CM | POA: Insufficient documentation

## 2017-02-13 MED FILL — ?ONDANSETRON HCL 4 MG TABLE: 4 | 6 days supply | Qty: 20 | Fill #0

## 2017-03-20 MED FILL — TOPIRAMATE 100 MG TABLET: 100 | 30 days supply | Qty: 60 | Fill #0

## 2017-03-27 DIAGNOSIS — Z0271 Encounter for disability determination: Secondary | ICD-10-CM

## 2017-03-28 ENCOUNTER — Ambulatory Visit: Payer: Self-pay | Admitting: Internal Medicine

## 2017-04-10 MED FILL — TOPIRAMATE 100 MG TABLET: 100 | 30 days supply | Qty: 90 | Fill #0

## 2017-04-15 ENCOUNTER — Ambulatory Visit (INDEPENDENT_AMBULATORY_CARE_PROVIDER_SITE_OTHER): Payer: Self-pay | Admitting: Internal Medicine

## 2017-04-15 ENCOUNTER — Encounter: Payer: Self-pay | Admitting: Internal Medicine

## 2017-04-15 VITALS — BP 130/88 | HR 74 | Resp 12 | Ht 64.0 in | Wt 137.0 lb

## 2017-04-15 DIAGNOSIS — G40909 Epilepsy, unspecified, not intractable, without status epilepticus: Secondary | ICD-10-CM

## 2017-04-15 DIAGNOSIS — F32A Depression, unspecified: Secondary | ICD-10-CM

## 2017-04-15 DIAGNOSIS — F329 Major depressive disorder, single episode, unspecified: Secondary | ICD-10-CM

## 2017-04-15 DIAGNOSIS — G4700 Insomnia, unspecified: Secondary | ICD-10-CM

## 2017-04-15 DIAGNOSIS — F419 Anxiety disorder, unspecified: Secondary | ICD-10-CM

## 2017-04-15 MED ORDER — CLONAZEPAM 1 MG PO TABS: ORAL_TABLET | ORAL | 0 refills | Status: DC

## 2017-04-15 NOTE — Progress Notes (Signed)
LCSW completed new pt screening with patient. Pt was immediately tearful and shared that she has been struggling with depression for the past year, since losing her job due to seizures and health problems. She reported multiple depressive and anxious symptoms. She shared that she had a "suicidal episode" in July 2018 and was hospitalized. She denied any current suicidal ideation. She reported that she is seeing a Veterinary surgeon at [facility] but is unsure if it is helpful. She reported that she was prescribed antidepressants but stopped taking them due to side effects. Pt agreed to have LCSW call her with weekly "check-ins."

## 2017-04-15 NOTE — Progress Notes (Signed)
Subjective:    Patient ID: Lisa Shaw, female    DOB: November 05, 1990, 26 y.o.   MRN: [id number]  HPI   Here to establish  1.  Seizure Disorder:  Started having seizures in 2013.  Was followed by Dr. Marjory Lies at [facility], but referred to [facility] and now followed by Neurology fellow, Dr. Massie Bougie, with supervising attending, Dr. Monia Sabal.  Has really only been seen at [facility] once on 02/10/2017 and hospitalized due to concerns with depression and suicidal ideation.  Previous EEG was abnormal, but short EEG performed during hospitalization was normal.    MRI of brain normal as well. Patient was noted to be quite depressed, but after evaluation by psychiatry, not felt to be suicidal.   Has been treated with Vimpat, Topomax at time of that first visit, but following hospitalization, just treated with Topomax 100 mg, 3 tabs at bedtime.   Has not had follow up with Dr. Massie Bougie since discharge. Maternal great grandmother and maternal great aunt with history of seizures.     2.  Poor sleep:  Tosses and turns and wakes at 2 a.m.  Jerking in her sleep wakes her as she is afraid she is having a seizure.  3-4 times weekly, she has bite marks on the inside of her cheek or on her tongue. Problem for at least 1 year. Goes to bed 10:30 p.m. Or 11.  Gets up at 6:30 or 7 a.m. No daytime napping. Drinks caffeinated beverage only on occasion,but then admits to drinking Dr. Reino Kent 2 daily. No chocolate.  3. Depression:  No problems until developed seizure disorder and lost job due to so many absences.  This was in 08/2015.  Previously worked for [employer] as a Marine scientist.  Cannot drive.  Stays at home much of time.  Is taking only 10 mg of  Citalopram, which was what she was discharged on from [facility] end of July.  Has been to [facility] prior to her [facility] hospitalization--thinks she saw her counselor 3 times and her psychiatrist of unknown name once, but has not been back since June or  early July. Admits to not taking the Citalopram much at all later--none for at least the past month.    Current Meds  Medication Sig  . acetaminophen (TYLENOL) 500 MG tablet Take 1,000 mg by mouth every 6 (six) hours as needed for mild pain, moderate pain, fever or headache.  . citalopram (CELEXA) 20 MG tablet Take 1/2 tab po qd  . ondansetron (ZOFRAN) 4 MG tablet Take 2 tablets (8 mg total) by mouth every 8 (eight) hours as needed for nausea or vomiting.  . SUMAtriptan (IMITREX) 100 MG tablet Take 1 tablet (100 mg total) by mouth every 2 (two) hours as needed for migraine. May repeat in 2 hours if headache persists or recurs.  . topiramate (TOPAMAX) 100 MG tablet Take 1 tablet (100 mg total) by mouth 2 (two) times daily. (Patient taking differently: Take 100 mg by mouth. 3 at bedtime)    Allergies  Allergen Reactions  . Keppra [Levetiracetam] Other (See Comments)    Reaction:  Headaches     Review of Systems     Objective:   Physical Exam  NAD,tearful at times HEENT:  PERRL, EOMI, discs sharp, TMs pearly gray, throat without injection. Neck:  Supple, No adenopathy, no thyromegaly Chest:  CTA CV:  RRR with normal S1 and S2, No S3, S4 , murmur or rub.  Radial and DP pulses normal  and equal. Abd:  S, NT, No HSM or mass + BS Neuro:  A & O x 3, CN  II-XII grossly intact.  Motor 5/5 throughout, DTRs 2+/4 throughout.  Gait normal       Assessment & Plan:  1.  Seizure Disorder: as per Dr. Suzanne Boron. Doreatha Martin.  Appears to have followup with their clinic on 11/5.  Discussed need to get good sleep to help prevent seizures as well.  2.  Depression/Anxiety:  Patient agreeable to restarting Citalopram at 10 mg daily.  Follow up in 1 week to see how she is doing and consider increase to 20 mg daily.   Will ask N. KNight, LCSW to contact patient regarding regular follow up for depression/anxiety and insomnia as patient is not really following with [facility] as previously thought.  3.  Insomnia:   Long discussion regarding good sleep hygiene.  Discussed small goals of going for a walk outside daily and gradually increasing the length of walk.  She will consider asking her mother before work or her sister during the day.   To wean her Dr. Reino Kent usage, which may also help long term with migraines. Short term Rx of Clonazepam 0.5 to 1 mg at bedtime for next 3 nights and then only as needed.  This to calm her anxiety of having a seizure while sleeping. Will get up and read if unable to fall asleep in 20 minutes or unable to reestablish sleep if awakens during night. Patient allowed father into the discussion with small goals discussed.

## 2017-04-18 ENCOUNTER — Telehealth: Payer: Self-pay | Admitting: Licensed Clinical Social Worker

## 2017-04-18 ENCOUNTER — Ambulatory Visit: Payer: Medicaid Other | Admitting: Diagnostic Neuroimaging

## 2017-04-18 NOTE — Telephone Encounter (Signed)
LCSW left voicemail regarding scheduling a counseling session.

## 2017-04-23 ENCOUNTER — Ambulatory Visit: Payer: Self-pay | Admitting: Internal Medicine

## 2017-04-24 ENCOUNTER — Other Ambulatory Visit: Payer: Self-pay | Admitting: Licensed Clinical Social Worker

## 2017-04-25 ENCOUNTER — Ambulatory Visit: Payer: Self-pay | Admitting: Internal Medicine

## 2017-04-29 ENCOUNTER — Ambulatory Visit: Payer: Self-pay | Admitting: Licensed Clinical Social Worker

## 2017-04-29 ENCOUNTER — Encounter: Payer: Self-pay | Admitting: Internal Medicine

## 2017-04-29 ENCOUNTER — Telehealth: Payer: Self-pay | Admitting: Internal Medicine

## 2017-04-29 ENCOUNTER — Ambulatory Visit (INDEPENDENT_AMBULATORY_CARE_PROVIDER_SITE_OTHER): Payer: Self-pay | Admitting: Internal Medicine

## 2017-04-29 VITALS — BP 122/80 | HR 74 | Resp 12 | Ht 64.0 in | Wt 135.0 lb

## 2017-04-29 DIAGNOSIS — G40909 Epilepsy, unspecified, not intractable, without status epilepticus: Secondary | ICD-10-CM

## 2017-04-29 DIAGNOSIS — F411 Generalized anxiety disorder: Secondary | ICD-10-CM

## 2017-04-29 DIAGNOSIS — F322 Major depressive disorder, single episode, severe without psychotic features: Secondary | ICD-10-CM

## 2017-04-29 DIAGNOSIS — F410 Panic disorder [episodic paroxysmal anxiety] without agoraphobia: Secondary | ICD-10-CM

## 2017-04-29 DIAGNOSIS — F32A Depression, unspecified: Secondary | ICD-10-CM

## 2017-04-29 DIAGNOSIS — G479 Sleep disorder, unspecified: Secondary | ICD-10-CM

## 2017-04-29 DIAGNOSIS — K0889 Other specified disorders of teeth and supporting structures: Secondary | ICD-10-CM

## 2017-04-29 DIAGNOSIS — Z599 Problem related to housing and economic circumstances, unspecified: Secondary | ICD-10-CM

## 2017-04-29 DIAGNOSIS — F329 Major depressive disorder, single episode, unspecified: Secondary | ICD-10-CM

## 2017-04-29 DIAGNOSIS — Z591 Inadequate housing: Secondary | ICD-10-CM

## 2017-04-29 HISTORY — DX: Sleep disorder, unspecified: G47.9

## 2017-04-29 HISTORY — DX: Depression, unspecified: F32.A

## 2017-04-29 MED ORDER — CITALOPRAM HYDROBROMIDE 20 MG PO TABS: ORAL_TABLET | ORAL | 11 refills | Status: DC

## 2017-04-29 NOTE — Telephone Encounter (Signed)
To Dr. Mulberry FYI 

## 2017-04-29 NOTE — Telephone Encounter (Signed)
Patient called to let doctor know that her [facility] expires on May 07, 2017.  She is trying to get an extension.
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
## 2017-04-29 NOTE — Patient Instructions (Signed)
Increase Citalopram to 20 mg daily (whole pill) See about getting someone to walk with you daily/join walking group with our Health Outreach Team (no pressure) Call in your financial assistance information to see when your coverage extends to

## 2017-04-29 NOTE — Telephone Encounter (Signed)
See if we can get her in for the sleep study before her assistance is up.
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
## 2017-05-02 NOTE — Progress Notes (Signed)
Patient referral and OV faxed to facility. They will contact patient to schedule appointment.

## 2017-05-06 ENCOUNTER — Other Ambulatory Visit: Payer: Self-pay | Admitting: Licensed Clinical Social Worker

## 2017-05-06 NOTE — Telephone Encounter (Signed)
Left message for patient to call and let us know if she wants to proceed with sleep study or if she is going to reapply for financial assistance

## 2017-05-07 ENCOUNTER — Other Ambulatory Visit (HOSPITAL_BASED_OUTPATIENT_CLINIC_OR_DEPARTMENT_OTHER): Payer: Self-pay

## 2017-05-07 DIAGNOSIS — G4700 Insomnia, unspecified: Secondary | ICD-10-CM

## 2017-05-07 DIAGNOSIS — G2581 Restless legs syndrome: Secondary | ICD-10-CM

## 2017-05-07 DIAGNOSIS — R5383 Other fatigue: Secondary | ICD-10-CM

## 2017-05-07 DIAGNOSIS — R569 Unspecified convulsions: Secondary | ICD-10-CM

## 2017-05-08 MED FILL — TOPIRAMATE 100 MG TABLET: 100 | 30 days supply | Qty: 90 | Fill #1

## 2017-05-11 ENCOUNTER — Ambulatory Visit (HOSPITAL_BASED_OUTPATIENT_CLINIC_OR_DEPARTMENT_OTHER): Payer: No Typology Code available for payment source | Attending: Internal Medicine | Admitting: Internal Medicine

## 2017-05-11 VITALS — Ht 64.0 in | Wt 135.0 lb

## 2017-05-11 DIAGNOSIS — G4761 Periodic limb movement disorder: Secondary | ICD-10-CM | POA: Insufficient documentation

## 2017-05-11 DIAGNOSIS — R569 Unspecified convulsions: Secondary | ICD-10-CM | POA: Insufficient documentation

## 2017-05-11 DIAGNOSIS — R5383 Other fatigue: Secondary | ICD-10-CM | POA: Insufficient documentation

## 2017-05-11 DIAGNOSIS — G4731 Primary central sleep apnea: Secondary | ICD-10-CM | POA: Insufficient documentation

## 2017-05-11 DIAGNOSIS — G2581 Restless legs syndrome: Secondary | ICD-10-CM

## 2017-05-11 DIAGNOSIS — G4700 Insomnia, unspecified: Secondary | ICD-10-CM | POA: Insufficient documentation

## 2017-05-18 DIAGNOSIS — R569 Unspecified convulsions: Secondary | ICD-10-CM

## 2017-05-18 NOTE — Procedures (Signed)
Patient Name: Lisa Shaw, Lisa Shaw Study Date: 05/11/2017 Gender: Female D.O.B: [birth year] Age (years): 26 Referring Provider: Julieanne MansonElizabeth Mulberry Height (inches): 64 Interpreting Physician: Jetty Duhamellinton Osa Fogarty MD, ABSM Weight (lbs): 135 RPSGT: Armen PickupFord, Evelyn BMI: 23 MRN: [id number] Neck Size: 13.00 CLINICAL INFORMATION Sleep Study Type: NPSG  Indication for sleep study: Daytime Fatigue, Insomnia  Epworth Sleepiness Score: 13  SLEEP STUDY TECHNIQUE As per the AASM Manual for the Scoring of Sleep and Associated Events v2.3 (April 2016) with a hypopnea requiring 4% desaturations.  The channels recorded and monitored were frontal, central and occipital EEG, electrooculogram (EOG), submentalis EMG (chin), nasal and oral airflow, thoracic and abdominal wall motion, anterior tibialis EMG, snore microphone, electrocardiogram, and pulse oximetry.  MEDICATIONS Medications self-administered by patient taken the night of the study : TOPAMAX, CLONAZEPAM  SLEEP ARCHITECTURE The study was initiated at 10:20:01 PM and ended at 4:54:58 AM.  Sleep onset time was 21.8 minutes and the sleep efficiency was 92.4%. The total sleep time was 365.0 minutes.  Stage REM latency was 74.5 minutes.  The patient spent 1.51% of the night in stage N1 sleep, 65.07% in stage N2 sleep, 1.23% in stage N3 and 32.19% in REM.  Alpha intrusion was absent.  Supine sleep was 40.55%.  RESPIRATORY PARAMETERS The overall apnea/hypopnea index (AHI) was 0.2 per hour. There were 1 total apneas, including 0 obstructive, 1 central and 0 mixed apneas. There were 0 hypopneas and 0 RERAs.  The AHI during Stage REM sleep was 0.5 per hour.  AHI while supine was 0.0 per hour.  The mean oxygen saturation was 98.30%. The minimum SpO2 during sleep was 97.00%.  soft snoring was noted during this study.  CARDIAC DATA The 2 lead EKG demonstrated sinus rhythm. The mean heart rate was 67.35 beats per minute. Other EKG findings include:  None.  LEG MOVEMENT DATA The total PLMS were 37 with a resulting PLMS index of 6.08. Associated arousal with leg movement index was 0.3 .  IMPRESSIONS - No significant obstructive sleep apnea occurred during this study (AHI = 0.2/h). - No significant central sleep apnea occurred during this study (CAI = 0.2/h). - Moderate oxygen desaturation was noted during this study (Min O2 = 97.00%). - The patient snored with soft snoring volume. - No cardiac abnormalities were noted during this study. - Mild periodic limb movements of sleep occurred during the study. No significant associated arousals. - Unremarkable sleep architecture  DIAGNOSIS - Periodic Limb Movement Syndrome (327.51 [G47.61 ICD-10]) - Primary Snoring (786.09 [R06.83 ICD-10])  RECOMMENDATIONS - Be careful with alcohol, sedatives and other CNS depressants that may worsen sleep apnea and disrupt normal sleep architecture. - Sleep hygiene should be reviewed to assess factors that may improve sleep quality. - Weight management and regular exercise should be initiated or continued if appropriate.  [Electronically signed] 05/18/2017 01:10 PM  Jetty Duhamellinton Beatriz Settles MD, ABSM Diplomate, American Board of Sleep Medicine   NPI: [id number]

## 2017-05-20 ENCOUNTER — Other Ambulatory Visit: Payer: Self-pay | Admitting: Licensed Clinical Social Worker

## 2017-05-27 ENCOUNTER — Ambulatory Visit: Payer: Medicaid Other | Admitting: Diagnostic Neuroimaging

## 2017-05-28 ENCOUNTER — Encounter: Payer: Self-pay | Admitting: Diagnostic Neuroimaging

## 2017-06-09 MED FILL — TOPIRAMATE 100 MG TABLET: 100 | 30 days supply | Qty: 90 | Fill #2

## 2017-06-10 ENCOUNTER — Encounter: Payer: Self-pay | Admitting: Internal Medicine

## 2017-06-10 ENCOUNTER — Ambulatory Visit: Payer: Medicaid Other | Admitting: Internal Medicine

## 2017-06-10 ENCOUNTER — Telehealth: Payer: Self-pay

## 2017-06-10 VITALS — BP 124/82 | HR 80 | Resp 12 | Ht 64.0 in | Wt 139.0 lb

## 2017-06-10 DIAGNOSIS — F32A Depression, unspecified: Secondary | ICD-10-CM

## 2017-06-10 DIAGNOSIS — L709 Acne, unspecified: Secondary | ICD-10-CM

## 2017-06-10 DIAGNOSIS — Z23 Encounter for immunization: Secondary | ICD-10-CM

## 2017-06-10 DIAGNOSIS — F329 Major depressive disorder, single episode, unspecified: Secondary | ICD-10-CM

## 2017-06-10 DIAGNOSIS — R569 Unspecified convulsions: Secondary | ICD-10-CM

## 2017-06-10 DIAGNOSIS — G479 Sleep disorder, unspecified: Secondary | ICD-10-CM

## 2017-06-10 MED ORDER — DOXYCYCLINE HYCLATE 100 MG PO TABS: ORAL_TABLET | ORAL | 0 refills | Status: DC

## 2017-06-10 NOTE — Telephone Encounter (Signed)
Left message for financial assistance to call with information regarding patient application.
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
## 2017-06-13 ENCOUNTER — Other Ambulatory Visit (INDEPENDENT_AMBULATORY_CARE_PROVIDER_SITE_OTHER): Payer: Self-pay | Admitting: Licensed Clinical Social Worker

## 2017-06-13 DIAGNOSIS — F411 Generalized anxiety disorder: Secondary | ICD-10-CM

## 2017-06-13 DIAGNOSIS — F322 Major depressive disorder, single episode, severe without psychotic features: Secondary | ICD-10-CM

## 2017-06-13 DIAGNOSIS — F410 Panic disorder [episodic paroxysmal anxiety] without agoraphobia: Secondary | ICD-10-CM

## 2017-06-17 NOTE — Progress Notes (Signed)
   THERAPY PROGRESS NOTE  Session Time: 60min  Participation Level: Active  Behavioral Response: Casual and Fairly GroomedAlertDepressed  Type of Therapy: Individual Therapy  Treatment Goals addressed: Coping  Interventions: Motivational Interviewing and Supportive  Summary: Lisa Shaw is a 26 y.o. female who presents with a depressed mood and appropriate affect. She reported that she was not feeling very well. She shared that she and her mother had moved to a new house two weeks ago, which was stressful. She shared that she is not getting along with her mother and that the longer they live together, the more tense things are. She completed an Ecomap with LCSW in order to assess and process about different life domains. Emrie shared that she has a good relationship with her brother but that she cannot live with him because he has a small house and children. She reported that no one in her family could really help her right now because "everyone is going through something." She state that she has never been very close with her sister. She shared that her closest relationship is with her grandfather. Veronda shared that having the seizure disorder and not being able to work has been devastating to her. She shared memories of being fired from her last job and how hurtful it was to her. She reported that she was fired due to her medical condition. Cailen shared that she would like to work and feel happy, but that she feels hopeless currently.   Suicidal/Homicidal: Nowithout intent/plan  Therapist Response: LCSW utilized supportive counseling techniques throughout the session in order to validate emotions and encourage open expression of emotion. LCSW completed an Ecomap with Johanne in order to assess life domains, supports, and stressors. LCSW and Maycie  processed about each life domain identified on the map. LCSW and Madasyn discussed alternate living situations that she could explore  now that the conflict with her mother is getting worse.  Plan: Return again in 2 weeks.    Nilda Simmeratosha Astella Desir, LCSW 06/17/2017

## 2017-06-18 ENCOUNTER — Encounter (HOSPITAL_BASED_OUTPATIENT_CLINIC_OR_DEPARTMENT_OTHER): Payer: Self-pay

## 2017-06-20 ENCOUNTER — Telehealth: Payer: Self-pay

## 2017-06-20 NOTE — Telephone Encounter (Signed)
Patient called stating she was seen in dental office today and dentist informed patient she had bite marks on inside of jaw and on her tongue. Patient states dentist wanted her to follow up here in office before they pull her tooth. Patient was scheduled for appointment on 06/24/17 @ 4 pm.

## 2017-06-24 ENCOUNTER — Ambulatory Visit: Payer: Self-pay | Admitting: Internal Medicine

## 2017-06-27 ENCOUNTER — Other Ambulatory Visit: Payer: Self-pay | Admitting: Licensed Clinical Social Worker

## 2017-06-27 ENCOUNTER — Ambulatory Visit: Payer: Medicaid Other | Admitting: Internal Medicine

## 2017-06-27 ENCOUNTER — Encounter: Payer: Self-pay | Admitting: Internal Medicine

## 2017-06-27 VITALS — BP 118/70 | HR 74 | Resp 12 | Ht 64.0 in | Wt 140.0 lb

## 2017-06-27 DIAGNOSIS — F411 Generalized anxiety disorder: Secondary | ICD-10-CM

## 2017-06-27 DIAGNOSIS — Z591 Inadequate housing: Secondary | ICD-10-CM

## 2017-06-27 DIAGNOSIS — Z599 Problem related to housing and economic circumstances, unspecified: Secondary | ICD-10-CM

## 2017-06-27 DIAGNOSIS — F322 Major depressive disorder, single episode, severe without psychotic features: Secondary | ICD-10-CM

## 2017-06-27 DIAGNOSIS — R569 Unspecified convulsions: Secondary | ICD-10-CM

## 2017-06-27 DIAGNOSIS — F410 Panic disorder [episodic paroxysmal anxiety] without agoraphobia: Secondary | ICD-10-CM

## 2017-06-27 DIAGNOSIS — K5900 Constipation, unspecified: Secondary | ICD-10-CM

## 2017-06-27 NOTE — Patient Instructions (Signed)
Citrucel in 8 ounces of water daily. Drink 6 eight ounce glasses of water daily

## 2017-06-27 NOTE — Progress Notes (Signed)
Subjective:    Patient ID: Lisa Shaw, female    DOB: 01/16/1991, 26 y.o.   MRN: [id number]  HPI   1.  Social/living situation:  Still with mother and believes her mother is okay with her staying there for now.   2.  Seizure Disorder: Patient states dentist told her she had bite marks on inside of mouth.  States she was told by dentist that she was having uncontrollable seizures and needed to contact her primary care doctor. They also either want her medication list or her records, she cannot recall. Dr. Richrd HumblesPenumalli's office apparently told her [facility], but owes $500.   Is applying for financial assistance [facility], but needs some tax information she does not have.  Very vague about the entire application.  Appears she applied in May and received through October.   She states much of Dr. Richrd HumblesPenumalli's charges were covered as well. States she is still getting her Top[facility] at [facility].  States she is never missing the medication. She does not know if she has had a seizure any time recently.  3.  Constipation:  Brings this up during exam.  Infrequent, hard stools.  Has BM maybe 2 times weekly.  No melena or hematochezia  Current Meds  Medication Sig  . citalopram (CELEXA) 20 MG tablet 1 tab by mouth once daily  . doxycycline (VIBRA-TABS) 100 MG tablet 1 tab by mouth twice daily for 7 days, then once daily for rest of month.  . topiramate (TOPAMAX) 100 MG tablet Take 300 mg by mouth at bedtime.     Allergies  Allergen Reactions  . Keppra [Levetiracetam] Other (See Comments)    Reaction:  Headaches        Review of Systems     Objective:   Physical Exam NAD HEENT:  PERRL, EOMI, Discs sharp, TMs pearly gray, throat without injection.  No acute lesions of buccal mucosa or tongue to suggest bite injury. Neck:  Supple, No adenopathy Chest:  CTA CV:  RRR without murmur or rub, radial pulses normal and equal Abd:  S, NT, No HSM or  mass.  No palpable stool.  + BS Neuro: A & O x 3, CN II-XII grossly intact, DTRs 2+/4, Motor 5/5 throughout, sensory grossly normal.  Gait normal.     Assessment & Plan:  1.  Difficult social situation/housing, etc.  Uncertain as to whether information from patient is correct.  She describes not wanting to bother anyone in her family for more support, including paternal grandfather.  We seem to be getting discordant information from her other family members. Patient is being supported by Samul DadaN. Knight, LCSW, Loretha BrasilJayne Lutz, RN, with [facility].  Samul DadaN. Knight, LCSW working on visit with patient and mother together.  2.  Seizure Disorder:  Encouraged patient to follow through with financial assistance with [facility] so that she can be seen again with Dr. Marjory LiesPenumalli.  We were unable to reach [facility] today to confirm their concerns. We will contact Dr. Richrd HumblesPenumalli's office as well to confirm she has current bill that must be paid before she can be seen there.  If that is the case, will encourage her to go back [facility].  No findings of new injury to buccal mucosa or tongue today.  Will call and get her refill history with [facility] as well.  3.  Constipation:  Encouraged eating more vegetables and fruit.  Also, to add  Citrucel in 8 oz water daily and increase water intake to 6-8 glasses water daily.
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
## 2017-07-09 ENCOUNTER — Other Ambulatory Visit (INDEPENDENT_AMBULATORY_CARE_PROVIDER_SITE_OTHER): Payer: Self-pay | Admitting: Licensed Clinical Social Worker

## 2017-07-09 DIAGNOSIS — F322 Major depressive disorder, single episode, severe without psychotic features: Secondary | ICD-10-CM

## 2017-07-09 DIAGNOSIS — F411 Generalized anxiety disorder: Secondary | ICD-10-CM

## 2017-07-09 DIAGNOSIS — F410 Panic disorder [episodic paroxysmal anxiety] without agoraphobia: Secondary | ICD-10-CM

## 2017-07-09 NOTE — Progress Notes (Signed)
   THERAPY PROGRESS NOTE  Session Time: 60min  Participation Level: Active  Behavioral Response: Casual and Fairly GroomedAlertAngry and Depressed  Type of Therapy: Individual Therapy  Treatment Goals addressed: Communication: with mother  Interventions: Supportive  Summary: Leeroy ChaJasmine D Kaucher is a 26 y.o. female who presents with an angry, depressed mood and appropriate affect. Her mother participated in the session in order to address the conflict that they have been having. Mother shared that she is frustrated that she and Deltha don't get along and that [name] does not seem to want to move out. Jasie shared that she wants to move out but that she does not want to live with her grandfather. She reported that she would rather live at a shelter than with her "nasty" family. She expressed anger towards her mother for not helping her enough, not cleaning the house enough, and being too focused on men. Mother shared that she wants to live her life more fully and deserves to see men if she wants. Mother shared that she would like Ronnett to go stay with her grandfather, as their conflict is getting worse. Both Colena and mother yelled frequently during the session and insulted one another. Zareena was minimally responsive to LCSW intervention; she became more agitated, upset, and tearful. She reported that she was not willing to make any changes to improve her relationship with her mother, as she just wants to move out and has a plan. She declined to share her plan. She denied any suicidal ideation or intent. Mother shared that she was overwhelmed by stress and having to take care of Varie. Ricki angrily told LCSW that she did not want any more counseling sessions.  Suicidal/Homicidal: Nowithout intent/plan  Therapist Response: LCSW utilized supportive counseling techniques throughout the session in order to validate emotions and encourage open expression of emotion. LCSW attempted to  explore and mediate the conflict between FoleyJasmine and her mother. LCSW de-escalated the anger multiple times during the session by [facility] was minimally responsive. LCSW re-worded and reframed in order to improve communication. LCSW helped [facility] and mother identify housing options. LCSW did verbal risk assessment with Arley. LCSW encouraged Aaryanna to schedule more sessions once she has calmed down; LCSW asked permission to call Kourtlynn another week.  Plan: Return again in 0 weeks.  Nilda Simmeratosha Haniyah Maciolek, LCSW 07/09/2017

## 2017-07-10 MED FILL — TOPIRAMATE 100 MG TABLET: 100 | 30 days supply | Qty: 90 | Fill #3

## 2017-07-30 ENCOUNTER — Ambulatory Visit: Payer: Medicaid Other | Admitting: Family Medicine

## 2017-08-13 ENCOUNTER — Telehealth: Payer: Self-pay | Admitting: Internal Medicine

## 2017-08-13 NOTE — Telephone Encounter (Signed)
To Dr. Mulberry for further direction 

## 2017-08-13 NOTE — Telephone Encounter (Signed)
Patient calling to inquire if medical papers that were dropped off for Dr. Delrae AlfredMulberry to complete have been completed.  Patient would like to pick up completed papers as soon as possible.  Patient can be [phone]

## 2017-08-14 MED FILL — TOPIRAMATE 100 MG TABLET: 100 | 30 days supply | Qty: 90 | Fill #4

## 2017-09-01 ENCOUNTER — Telehealth: Payer: Self-pay | Admitting: Diagnostic Neuroimaging

## 2017-09-01 NOTE — Telephone Encounter (Signed)
Pt calling she has been approved for [facility] and is requesting a call back to discuss. Thank you

## 2017-09-10 ENCOUNTER — Ambulatory Visit: Payer: Medicaid Other | Admitting: Internal Medicine

## 2017-09-16 ENCOUNTER — Ambulatory Visit: Payer: Medicaid Other | Admitting: Internal Medicine

## 2017-09-16 ENCOUNTER — Encounter: Payer: Self-pay | Admitting: Internal Medicine

## 2017-09-16 VITALS — BP 118/78 | HR 80 | Resp 12 | Ht 64.0 in | Wt 142.0 lb

## 2017-09-16 DIAGNOSIS — L709 Acne, unspecified: Secondary | ICD-10-CM

## 2017-09-16 DIAGNOSIS — G479 Sleep disorder, unspecified: Secondary | ICD-10-CM

## 2017-09-16 DIAGNOSIS — R569 Unspecified convulsions: Secondary | ICD-10-CM

## 2017-09-16 DIAGNOSIS — F322 Major depressive disorder, single episode, severe without psychotic features: Secondary | ICD-10-CM

## 2017-09-16 MED FILL — TOPIRAMATE 100 MG TABLET: 100 | 30 days supply | Qty: 90 | Fill #5

## 2017-09-16 NOTE — Patient Instructions (Signed)
Try benzoyl peroxide 2.5% --wash face and apply to affected areas every night.  Will bleach sheets and blankets--so use white pillow cases, etc or use ones you don't care if get bleached. Wash off in morning.

## 2017-09-16 NOTE — Progress Notes (Signed)
   Subjective:    Patient ID: Lisa Shaw, female    DOB: 11/27/1990, 27 y.o.   MRN: [id number]  HPI   1.  Social issues:  Still living with mother.  Despite what has transpired in counseling sessions she states "we're fine"  She states she is just trying to "maintain"  And does not have the $3 to be seen by Lisa SimmerNatosha Knight, LCSW any longer.   The report from her last counseling session was that she became angry and did not want any further sessions. She is unwilling to go into any depth about this. She has been staying at her brother's house the past couple of days.  She states this out of the blue.  Not clear if more problems in her relationship with her mother.  2.  Seizures:  Approved for [facility] Financial Assistance:  She states she has an outstanding bill and she cannot afford to pay to get back in.   She states her family is unwilling to help her financially to get this paid off.  She is fine with me speaking with her mother and paternal grandfather about helping her with this.  3.  Sleep disturbance:  Still with difficulties.  Not going for counseling.  4.  Depression:  She is not taking any medication other than Topomax for seizures currently.  Has not taken any medication otherwise since November.  5.  Acne: mainly pustules.  States Doxy did not help.    Current Meds  Medication Sig  . topiramate (TOPAMAX) 100 MG tablet Take 300 mg by mouth at bedtime.     Allergies  Allergen Reactions  . Keppra [Levetiracetam] Other (See Comments)    Reaction:  Headaches         Review of Systems     Objective:   Physical Exam Appears tired. HEENT:  Few scattered pustules on cheeks bilaterally PERRL, EOMI Neck:  Supple, No adenopathy Chest:  CTA CV:  RRR without murmur or rub Neuro:  A & O x 3, CN  II-XII grossly intact.  Gait normal       Assessment & Plan:  1.  Possible Seizure Disorder:  Called Lisa Shaw' office and confirmed if she has [facility] Financial  Assistance, it is likely not retroactive to her bills from 2018 and her balance there is $472.80. Office staff stated there should not be a problem with her being seen at other Neurology office through [facility]  [place]. I did also speak with mother after patient signed release of information.  She is financially unable to cover this amount as well.  She states she has been trying to cover Lisa Shaw's Topamax and her antidepressant, though Lisa Shaw does not fill the latter as she does not want to be on "a bunch of pills" Will send referral to [facility]. Very concerned we will not make much headway as patient not getting mental healthcare as recommended.  I also have patient's written permission to discuss with paternal grandpa to see if they would help her with funds for neuro and meds.  2.  Mental Health:  Not clear if dealing with just depression or possible personality disorder or likely both.   Encouraged her to reestablish counseling.   Encouraged to take Citalopram. Not clear what occurred, but now living with brother.   Mother did not elaborate as well. Sleep disturbance likely related to this as well.  3.  Acne:  Recommended low strength benzoyl peroxide 2.5% nightly.

## 2017-09-17 ENCOUNTER — Telehealth: Payer: Self-pay | Admitting: Internal Medicine

## 2017-09-17 MED ORDER — ONDANSETRON HCL 4 MG PO TABS: ORAL_TABLET | ORAL | 0 refills | Status: DC

## 2017-09-18 ENCOUNTER — Encounter: Payer: Self-pay | Admitting: Neurology

## 2017-09-18 NOTE — Telephone Encounter (Signed)
noted 

## 2017-09-18 NOTE — Telephone Encounter (Signed)
I spoke with her about this early yesterday morning before clinic opened and told her I would send it there.  I did send it.

## 2017-09-18 NOTE — Telephone Encounter (Signed)
Spoke with health department and the pharmacy does carry generic zofran 4 mg tablets

## 2017-10-13 MED ORDER — TOPIRAMATE 100 MG PO TABS: 300.0000 mg | ORAL_TABLET | Freq: Every day | ORAL | 1 refills | Status: DC

## 2017-10-13 NOTE — Telephone Encounter (Signed)
Patient needs refill on topiramate (TOPAMAX) 100 mg tablet.  Patient can longer get Rx from prescribing  physician.  Patient was told by Dr. Delrae AlfredMulberry to call her to get a refill on the Rx.

## 2017-10-13 NOTE — Telephone Encounter (Signed)
To Dr. Mulberry for approval 

## 2017-10-13 NOTE — Telephone Encounter (Signed)
Spoke with patient. Rx sent to pharmacy.

## 2017-10-17 MED FILL — TOPIRAMATE 100 MG TABLET: 100 | 30 days supply | Qty: 90 | Fill #0

## 2017-10-30 ENCOUNTER — Encounter (HOSPITAL_COMMUNITY): Payer: Self-pay

## 2017-10-30 ENCOUNTER — Other Ambulatory Visit: Payer: Self-pay

## 2017-10-30 ENCOUNTER — Emergency Department (HOSPITAL_COMMUNITY): Payer: Self-pay

## 2017-10-30 ENCOUNTER — Emergency Department (HOSPITAL_COMMUNITY)
Admission: EM | Admit: 2017-10-30 | Discharge: 2017-10-30 | Disposition: A | Discharge status: Home / Self Care | Payer: Self-pay | Attending: Emergency Medicine | Admitting: Emergency Medicine

## 2017-10-30 DIAGNOSIS — J069 Acute upper respiratory infection, unspecified: Secondary | ICD-10-CM | POA: Insufficient documentation

## 2017-10-30 DIAGNOSIS — F17210 Nicotine dependence, cigarettes, uncomplicated: Secondary | ICD-10-CM | POA: Insufficient documentation

## 2017-10-30 MED ORDER — FLUTICASONE PROPIONATE 50 MCG/ACT NA SUSP: 1.0000 | Freq: Every day | NASAL | 2 refills | Status: DC

## 2017-10-30 MED ORDER — BENZONATATE 100 MG PO CAPS: 100.0000 mg | ORAL_CAPSULE | Freq: Three times a day (TID) | ORAL | 0 refills | Status: DC | PRN

## 2017-10-30 MED ORDER — IBUPROFEN 800 MG PO TABS: 800.0000 mg | ORAL_TABLET | Freq: Three times a day (TID) | ORAL | 0 refills | Status: DC

## 2017-10-30 MED FILL — FLUTICASONE PROP 50 MCG SPR: 50 | 60 days supply | Qty: 16 | Fill #0

## 2017-10-30 MED FILL — IBUPROFEN 800 MG TABLET: 800 | 7 days supply | Qty: 21 | Fill #0

## 2017-10-30 MED FILL — BENZONATATE 100 MG CAP: 100 | 7 days supply | Qty: 21 | Fill #0

## 2017-10-30 NOTE — ED Triage Notes (Signed)
Pt reports nasal congestion, drainage, cough, nose bleeds since Saturday. Pt states allergy meds not helping

## 2017-10-30 NOTE — ED Provider Notes (Signed)
[facility] EMERGENCY DEPARTMENT Provider Note   CSN: [id number] Arrival date & time: 10/30/17  0857     History   Chief Complaint Chief Complaint  Patient presents with  . Nasal Congestion    HPI Lisa Shaw is a 27 y.o. female with a hx of tobacco use, depression, epilepsy, and migraines who presents to the ED with URI sxs for the past 5 days. Patient states she has been experiencing congestion, rhinorrhea, ear pressure, and cough that is intermittently productive with clear/green mucous sputum. States she has had a few quickly resolving nose bleeds that have been fairly minimal and after blowing the nose/digital manipulation. Reports some dyspnea with coughing. Has tried mucinex and nasal saline spray at home without relief. Denies fever, chills, sore throat, or chest pain.   HPI  Past Medical History:  Diagnosis Date  . Depression 04/29/2017  . Epilepsy (HCC)   . Migraine   . Seizures (HCC) 07/18/2016  . Sleep disturbance 04/29/2017    Patient Active Problem List   Diagnosis Date Noted  . Depression 04/29/2017  . Sleep disturbance 04/29/2017  . Abdominal pain 10/29/2016  . Seizures (HCC) 10/25/2016    History reviewed. No pertinent surgical history.   OB History   None      Home Medications    Prior to Admission medications   Medication Sig Start Date End Date Taking? Authorizing Provider  citalopram (CELEXA) 20 MG tablet 1 tab by mouth once daily Patient not taking: Reported on 09/16/2017 04/29/17   Julieanne Manson, MD  clonazePAM (KLONOPIN) 1 MG tablet 1/2 to 1 tab by mouth at bedtime for next 3 nights, then only as needed. Patient not taking: Reported on 04/29/2017 04/15/17   Julieanne Manson, MD  doxycycline (VIBRA-TABS) 100 MG tablet 1 tab by mouth twice daily for 7 days, then once daily for rest of month. Patient not taking: Reported on 09/16/2017 06/10/17   Julieanne Manson, MD  ondansetron (ZOFRAN) 4 MG tablet 2 tabs  by mouth every 8 hours as needed for nausea and vomiting. 09/17/17   Julieanne Manson, MD  SUMAtriptan (IMITREX) 100 MG tablet Take 1 tablet (100 mg total) by mouth every 2 (two) hours as needed for migraine. May repeat in 2 hours if headache persists or recurs. Patient not taking: Reported on 06/10/2017 08/13/16   Penumalli, Glenford Bayley, MD  topiramate (TOPAMAX) 100 MG tablet Take 3 tablets (300 mg total) by mouth at bedtime. 10/13/17   Julieanne Manson, MD    Family History Family History  Problem Relation Age of Onset  . Seizures Other     Social History Social History   Tobacco Use  . Smoking status: Current Every Day Smoker    Types: Cigarettes  . Smokeless tobacco: Never Used  . Tobacco comment: 1/30 avg 2/day  Substance Use Topics  . Alcohol use: No  . Drug use: Yes    Types: Marijuana    Comment: 1/30 avg 1-2 x daily     Allergies   Keppra [levetiracetam]   Review of Systems Review of Systems  Constitutional: Negative for chills and fever.  HENT: Positive for congestion, ear pain and rhinorrhea. Negative for sore throat.   Respiratory: Positive for cough and shortness of breath (with coughing).   Cardiovascular: Negative for chest pain, palpitations and leg swelling.  Neurological: Negative for syncope, weakness and light-headedness.     Physical Exam Updated Vital Signs BP 127/89 (BP Location: Left Arm)   Pulse 84  Temp 98.4 F (36.9 C)   Resp 16   Ht 5\' 7"  (1.702 m)   Wt 63.5 kg (140 lb)   LMP 10/30/2017   SpO2 100%   BMI 21.93 kg/m   Physical Exam  Constitutional: She appears well-developed and well-nourished.  Non-toxic appearance. No distress.  HENT:  Head: Normocephalic and atraumatic.  Right Ear: Tympanic membrane is not perforated, not erythematous, not retracted and not bulging.  Left Ear: Tympanic membrane is not perforated, not erythematous, not retracted and not bulging.  Nose: Mucosal edema (congestion) present. No epistaxis. Right  sinus exhibits no maxillary sinus tenderness and no frontal sinus tenderness. Left sinus exhibits no maxillary sinus tenderness and no frontal sinus tenderness.  Mouth/Throat: Uvula is midline and oropharynx is clear and moist. No oropharyngeal exudate or posterior oropharyngeal erythema.  Eyes: Pupils are equal, round, and reactive to light. Conjunctivae are normal. Right eye exhibits no discharge. Left eye exhibits no discharge.  Neck: Normal range of motion. Neck supple.  Cardiovascular: Normal rate and regular rhythm.  No murmur heard. Pulmonary/Chest: Effort normal and breath sounds normal. No respiratory distress. She has no wheezes. She has no rhonchi. She has no rales.  Abdominal: Soft. She exhibits no distension. There is no tenderness.  Lymphadenopathy:    She has no cervical adenopathy.  Neurological: She is alert.  Skin: Skin is warm and dry. No rash noted.  Psychiatric: She has a normal mood and affect. Her behavior is normal.  Nursing note and vitals reviewed.    ED Treatments / Results  Labs (all labs ordered are listed, but only abnormal results are displayed) Labs Reviewed - No data to display  EKG None  Radiology Dg Chest 2 View  Result Date: 10/30/2017 CLINICAL DATA:  Cough, congestion and chest pain since Saturday, smoker EXAM: CHEST - 2 VIEW COMPARISON:  08/23/2013 FINDINGS: Normal heart size, mediastinal contours, and pulmonary vascularity. Lungs clear. No pleural effusion or pneumothorax. Bones unremarkable. IMPRESSION: Normal exam. Electronically Signed   By: Ulyses Southward M.D.   On: 10/30/2017 09:57    Procedures Procedures (including critical care time)  Medications Ordered in ED Medications - No data to display   Initial Impression / Assessment and Plan / ED Course  I have reviewed the triage vital signs and the nursing notes.  Pertinent labs & imaging results that were available during my care of the patient were reviewed by me and considered in my  medical decision making (see chart for details).   Patient presents with URI symptoms for the past 5 days.  Patient is nontoxic-appearing, in no apparent distress, vitals WNL.  Patient's lungs are clear to auscultation bilaterally, chest x-ray negative for infiltrate, no signs of respiratory distress, doubt pneumonia. Sxs onset 5 days ago, afebrile, no sinus tenderness, doubt acute bacterial sinusitis.  Centor score 0 doubt strep pharyngitis.  Suspect viral versus allergic etiology.  Will treat supportively with Flonase, Tessalon, and ibuprofen. I discussed results, treatment plan, need for PCP follow-up, and return precautions with the patient. Provided opportunity for questions, patient confirmed understanding and is in agreement with plan.   Final Clinical Impressions(s) / ED Diagnoses   Final diagnoses:  Upper respiratory tract infection, unspecified type    ED Discharge Orders        Ordered    benzonatate (TESSALON) 100 MG capsule  3 times daily PRN     10/30/17 1019    fluticasone (FLONASE) 50 MCG/ACT nasal spray  Daily     10/30/17  1019    ibuprofen (ADVIL,MOTRIN) 800 MG tablet  3 times daily     10/30/17 [id number]       Eymi Lipuma, VanceSamantha R, PA-C 10/30/17 1340    Rolan BuccoBelfi, Melanie, MD 10/30/17 [phone]

## 2017-10-30 NOTE — ED Notes (Signed)
Declined W/C at D/C and was escorted to lobby by RN. 

## 2017-10-30 NOTE — Discharge Instructions (Signed)
You were seen in the emergency today, your chest x-ray did not show signs of pneumonia. I have prescribed you multiple medications to treat your symptoms.   -Flonase to be used 1 spray in each nostril daily.  This medication is used to treat your congestion.  -Tessalon can be taken once every 8 hours as needed.  This medication is used to treat your cough.  -Ibuprofen to be taken once every 8 hours as needed for pain.  We have prescribed you new medication(s) today. Discuss the medications prescribed today with your pharmacist as they can have adverse effects and interactions with your other medicines including over the counter and prescribed medications. Seek medical evaluation if you start to experience new or abnormal symptoms after taking one of these medicines, seek care immediately if you start to experience difficulty breathing, feeling of your throat closing, facial swelling, or rash as these could be indications of a more serious allergic reaction   You will need to follow-up with your primary care provider in 1 week if your symptoms have not improved.  If you do not have a primary care provider one is provided in your discharge instructions.  Return to the emergency department for any new or worsening symptoms including but not limited to fever, difficulty breathing, chest pain, passing out, or any other concerns.

## 2017-11-18 MED FILL — TOPIRAMATE 100 MG TABS: 100 | 30 days supply | Qty: 90 | Fill #1

## 2017-12-02 ENCOUNTER — Encounter: Payer: Self-pay | Admitting: Neurology

## 2017-12-02 ENCOUNTER — Ambulatory Visit (INDEPENDENT_AMBULATORY_CARE_PROVIDER_SITE_OTHER): Payer: No Typology Code available for payment source | Admitting: Neurology

## 2017-12-02 ENCOUNTER — Other Ambulatory Visit: Payer: Self-pay

## 2017-12-02 VITALS — BP 128/82 | HR 104 | Ht 67.0 in | Wt 141.0 lb

## 2017-12-02 DIAGNOSIS — G40009 Localization-related (focal) (partial) idiopathic epilepsy and epileptic syndromes with seizures of localized onset, not intractable, without status epilepticus: Secondary | ICD-10-CM

## 2017-12-02 DIAGNOSIS — F321 Major depressive disorder, single episode, moderate: Secondary | ICD-10-CM

## 2017-12-02 DIAGNOSIS — G43009 Migraine without aura, not intractable, without status migrainosus: Secondary | ICD-10-CM

## 2017-12-02 MED ORDER — OXCARBAZEPINE 300 MG PO TABS: ORAL_TABLET | ORAL | 6 refills | Status: DC

## 2017-12-02 MED ORDER — TOPIRAMATE 100 MG PO TABS: 300.0000 mg | ORAL_TABLET | Freq: Every day | ORAL | 6 refills | Status: DC

## 2017-12-02 NOTE — Progress Notes (Signed)
NEUROLOGY CONSULTATION NOTE  Lisa Shaw MRN: [id number] DOB: 07/31/1990  Referring provider: Dr. Julieanne Manson Primary care provider: Dr. Julieanne Manson  Reason for consult:  seizures  Dear Dr Delrae Alfred:  Thank you for your kind referral of Lisa Shaw for consultation of the above symptoms. Although her history is well known to you, please allow me to reiterate it for the purpose of our medical record. She is alone in the office today. Records and images were personally reviewed where available.  HISTORY OF PRESENT ILLNESS: This is a pleasant 27 year old right-handed woman with a history of seizures since 2013. Records from [facility] and [facility] were reviewed and will be summarized here, she is depressed in the office today and is a poor historian. She initially had nocturnal seizures with described diffuse rhythmic jerking with eye rolling backwards. She had an EEG in 06/2012 reporting left frontal sharps. There is also note of episodes in 2012 where she would have staring spells lasting around 2 minutes with associated diaphoresis and post-ictal confusion with violence usually at night or early in the morning. Both seizure types were associated with tongue bite and urinary incontinence. There is report of deja vu, and she reports tingling in both hands during the "stare offs." She lives with her mother but is mostly by herself at home during the day, and feels she has the "stare off" episodes around twice a week. She was initially started on Keppra which caused mood issues, then switched to Topamax. It appears she initially had mental slowing on Topamax, but has been taking  qhs since 2015. She had seen Dr. Marjory Lies at [facility] last year and was started on Vimpat which she stopped again due to side effects. She reports the last nocturnal convulsion was in June 2018 when she woke up feeling weird with her tongue sore and with a headache. She was given a headache cocktail  but was also noted to be very anxious and depressed. She was admitted for involuntary commitment at [facility] in July 2018 for suicidal ideation. She currently denies any suicidal ideation but continues to report significant depression. She reports body twitches in her arms or legs. She has headaches on a daily basis, taking an average of 2 Tylenol or BC powder daily. She feels it is due to "overthinking or depression." She has sleep difficulties with interrupted 5-6 hours of sleep at night. She feels fatigued and drowsy during the day. She has dizziness when standing. Vision is occasionally blurred when watching TV. She has noticed swallowing difficulties. She notices her right foot gets numb frequently and her balance is off. No falls. 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   Epilepsy Risk Factors:  Her maternal great grandmother and great aunt had seizures. Otherwise she had a normal birth and early development.  There is no history of febrile convulsions, CNS infections such as meningitis/encephalitis, significant traumatic brain injury, neurosurgical procedures.  Prior AEDs: Keppra, Vimpat Laboratory Data:  EEGs: EEG in 06/2012 reported as abnormal secondary to focal sharp wave activity in the left frontal region MRI: I personally reviewed MRI brain without contrast done 06/2012 which was degraded by motion. Lateral ventricles are mildly prominent in size with a slightly rounded appearance, no transependymal CSF flow. Hippocampi appear symmetric with no abnormal signal seen.  PAST MEDICAL HISTORY: Past Medical History:  Diagnosis Date  . Depression 04/29/2017  . Epilepsy (HCC)   .  Migraine   . Seizures (HCC) 07/18/2016  . Sleep disturbance 04/29/2017    PAST SURGICAL HISTORY: No past surgical history on file.  MEDICATIONS: Current Outpatient Medications on File Prior to Visit  Medication Sig Dispense Refill  .  benzonatate (TESSALON) 100 MG capsule Take 1 capsule (100 mg total) by mouth 3 (three) times daily as needed for cough. 21 capsule 0  . fluticasone (FLONASE) 50 MCG/ACT nasal spray Place 1 spray into both nostrils daily. 16 g 2  . ibuprofen (ADVIL,MOTRIN) 800 MG tablet Take 1 tablet (800 mg total) by mouth 3 (three) times daily. 21 tablet 0  . ondansetron (ZOFRAN) 4 MG tablet 2 tabs by mouth every 8 hours as needed for nausea and vomiting. 30 tablet 0  . topiramate (TOPAMAX) 100 MG tablet Take 3 tablets (300 mg total) by mouth at bedtime. 90 tablet 1   No current facility-administered medications on file prior to visit.     ALLERGIES: Allergies  Allergen Reactions  . Keppra [Levetiracetam] Other (See Comments)    Reaction:  Headaches     FAMILY HISTORY: Family History  Problem Relation Age of Onset  . Seizures Other     SOCIAL HISTORY: Social History   Socioeconomic History  . Marital status: Single    Spouse name: Not on file  . Number of children: 0  . Years of education: 12th  . 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 Financial resource strain: Not on file  . Food insecurity:    Worry: Not on file    Inability: Not on file  . Transportation needs:    Medical: Not on file    Non-medical: Not on file  Tobacco Use  . Smoking status: Current Every Day Smoker    Types: Cigarettes  . Smokeless tobacco: Never Used  . Tobacco comment: 1/30 avg 2/day  Substance and Sexual Activity  . Alcohol use: No  . Drug use: Yes    Types: Marijuana    Comment: 1/30 avg 1-2 x daily  . Sexual activity: Yes  Lifestyle  . Physical activity:    Days per week: Not on file    Minutes per session: Not on file  . Stress: Not on file  Relationships  . Social connections:    Talks on phone: Not on file    Gets together: Not on file    Attends religious service: Not on file    Active member of club or  organization: Not on file    Attends meetings of clubs or organizations: Not on file    Relationship status: Not on file  . Intimate partner violence:    Fear of current or ex partner: Not on file    Emotionally abused: Not on file    Physically abused: Not on file    Forced sexual activity: Not on file  Other Topics Concern  . Not on file  Social History Narrative   Patient lives at home with her mother.   Caffeine Use: sodas daily    REVIEW OF SYSTEMS: Constitutional: No fevers, chills, or sweats, + generalized fatigue, change in appetite Eyes: No visual changes, double vision, eye pain Ear, nose and throat: No hearing loss, ear pain, nasal congestion, sore throat Cardiovascular: No chest pain, palpitations Respiratory:  No shortness of breath at rest or with exertion, wheezes GastrointestinaI: No nausea, vomiting, diarrhea, abdominal pain, fecal incontinence Genitourinary:  No  dysuria, urinary retention or frequency Musculoskeletal:  + neck pain, back pain Integumentary: No rash, pruritus, skin lesions Neurological: as above Psychiatric: + depression, insomnia, anxiety Endocrine: No palpitations,+ fatigue, mood swings,no change in appetite, change in weight, increased thirst Hematologic/Lymphatic:  No anemia, purpura, petechiae. Allergic/Immunologic: no itchy/runny eyes, nasal congestion, recent allergic reactions, rashes  PHYSICAL EXAM: Vitals:   12/02/17 0858  BP: 128/82  Pulse: (!) 104  SpO2: 100%   General: No acute distress, flat affect, appears depressed Head:  Normocephalic/atraumatic Eyes: Fundoscopic exam shows bilateral sharp discs, no vessel changes, exudates, or hemorrhages Neck: supple, no paraspinal tenderness, full range of motion Back: No paraspinal tenderness Heart: regular rate and rhythm Lungs: Clear to auscultation bilaterally. Vascular: No carotid bruits. Skin/Extremities: No rash, no edema Neurological Exam: Mental status: alert and oriented to  person, place, and time, no dysarthria or aphasia, Fund of knowledge is appropriate.  Recent and remote memory are intact.  Attention and concentration are normal.    Able to name objects and repeat phrases. Cranial nerves: CN I: not tested CN II: pupils equal, round and reactive to light, visual fields intact, fundi unremarkable. CN III, IV, VI:  full range of motion, no nystagmus, no ptosis CN V: facial sensation intact CN VII: upper and lower face symmetric CN VIII: hearing intact to finger rub CN IX, X: gag intact, uvula midline CN XI: sternocleidomastoid and trapezius muscles intact CN XII: tongue midline Bulk & Tone: normal, no fasciculations. Motor: 5/5 throughout with no pronator drift. Sensation: intact to light touch, cold, pin, vibration and joint position sense.  No extinction to double simultaneous stimulation.  Romberg test negative Deep Tendon Reflexes: brisk +2 throughout, no ankle clonus, negative Hoffman sign Plantar responses: downgoing bilaterally Cerebellar: no incoordination on finger to nose testing Gait: narrow-based and steady, able to tandem walk adequately. Tremor: none  IMPRESSION: This is a pleasant 27 year old right-handed woman with a history of depression, presenting to establish care for seizures. 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 Prior EEG in 2013 reported left frontal sharp waves, MRI degraded by motion but no acute changes seen. She denies any nocturnal convulsions since Jun 2013 but continues to report staring spells around twice a week. She reports daily headaches. I suspect this may be related to significant depression. She is very depressed in the office today, no suicidal ideation. She is agreeable to starting a new seizure medication that also helps with mood stabilization, oxcarbazepine. Start  BID x 1 week, then  BID x 1 week, then increase to  BID. Side  effects were discussed. Continue Topamax  qhs for now, we may reduce dose in the future. We discussed the importance of managing depression, she was given information to contact [facility] for follow-up. She does not drive. No pregnancy plans. She will follow-up in 2 months and knows to call for any changes.   Thank you for allowing me to participate in the care of this patient. Please do not hesitate to call for any questions or concerns.   Patrcia Dolly, M.D.  CC: Dr. Delrae Alfred
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
## 2017-12-17 ENCOUNTER — Ambulatory Visit: Payer: Medicaid Other | Admitting: Internal Medicine

## 2017-12-23 ENCOUNTER — Other Ambulatory Visit: Payer: Self-pay

## 2017-12-23 ENCOUNTER — Telehealth: Payer: Self-pay | Admitting: Neurology

## 2017-12-23 MED ORDER — TOPIRAMATE 100 MG PO TABS: 300.0000 mg | ORAL_TABLET | Freq: Every day | ORAL | 6 refills | Status: DC

## 2017-12-23 MED ORDER — OXCARBAZEPINE 300 MG PO TABS: ORAL_TABLET | ORAL | 6 refills | Status: DC

## 2017-12-23 MED FILL — OXcarbazepine 300 MG TABS: 300 | 30 days supply | Qty: 60 | Fill #0

## 2017-12-23 MED FILL — TOPIRAMATE 100 MG TABS: 100 | 30 days supply | Qty: 90 | Fill #0

## 2017-12-23 NOTE — Telephone Encounter (Signed)
Rx's sent to [facility]

## 2017-12-23 NOTE — Telephone Encounter (Signed)
Patient states that she needs her [facility] the [facility]. She could only tell me one of the medications  And it was Topamax but she states Karel Jarvisquino gave her another medication as well that needs to be called in

## 2018-01-13 ENCOUNTER — Ambulatory Visit: Payer: Medicaid Other | Admitting: Internal Medicine

## 2018-01-18 ENCOUNTER — Other Ambulatory Visit: Payer: Self-pay

## 2018-01-18 ENCOUNTER — Emergency Department (HOSPITAL_COMMUNITY)
Admission: EM | Admit: 2018-01-18 | Discharge: 2018-01-18 | Disposition: A | Discharge status: Home / Self Care | Payer: No Typology Code available for payment source | Attending: Emergency Medicine | Admitting: Emergency Medicine

## 2018-01-18 ENCOUNTER — Encounter (HOSPITAL_COMMUNITY): Payer: Self-pay | Admitting: *Deleted

## 2018-01-18 DIAGNOSIS — Y9389 Activity, other specified: Secondary | ICD-10-CM | POA: Insufficient documentation

## 2018-01-18 DIAGNOSIS — Y999 Unspecified external cause status: Secondary | ICD-10-CM | POA: Insufficient documentation

## 2018-01-18 DIAGNOSIS — S0990XA Unspecified injury of head, initial encounter: Secondary | ICD-10-CM | POA: Insufficient documentation

## 2018-01-18 DIAGNOSIS — Z79899 Other long term (current) drug therapy: Secondary | ICD-10-CM | POA: Insufficient documentation

## 2018-01-18 DIAGNOSIS — F17210 Nicotine dependence, cigarettes, uncomplicated: Secondary | ICD-10-CM | POA: Insufficient documentation

## 2018-01-18 DIAGNOSIS — Y92018 Other place in single-family (private) house as the place of occurrence of the external cause: Secondary | ICD-10-CM | POA: Insufficient documentation

## 2018-01-18 MED ORDER — IBUPROFEN 800 MG PO TABS
800.0000 mg | ORAL_TABLET | Freq: Once | ORAL | Status: AC
Start: 2018-01-18 — End: 2018-01-18
  Administered 2018-01-18: 800 mg via ORAL
  Filled 2018-01-18: qty 1

## 2018-01-18 MED ORDER — DIPHENHYDRAMINE HCL 50 MG/ML IJ SOLN
25.0000 mg | Freq: Once | INTRAMUSCULAR | Status: AC
  Administered 2018-01-18: 25 mg via INTRAMUSCULAR
  Filled 2018-01-18: qty 1

## 2018-01-18 MED ORDER — PROCHLORPERAZINE EDISYLATE 10 MG/2ML IJ SOLN
10.0000 mg | Freq: Once | INTRAMUSCULAR | Status: AC
  Administered 2018-01-18: 10 mg via INTRAMUSCULAR
  Filled 2018-01-18: qty 2
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
## 2018-01-26 ENCOUNTER — Other Ambulatory Visit: Payer: Self-pay | Admitting: Diagnostic Neuroimaging

## 2018-01-26 MED FILL — TOPIRAMATE 100 MG TABS: 100 | 30 days supply | Qty: 90 | Fill #1

## 2018-02-11 ENCOUNTER — Ambulatory Visit: Payer: No Typology Code available for payment source | Admitting: Neurology

## 2018-02-11 ENCOUNTER — Encounter (HOSPITAL_COMMUNITY): Payer: Self-pay | Admitting: Emergency Medicine

## 2018-02-11 ENCOUNTER — Emergency Department (HOSPITAL_COMMUNITY)
Admission: EM | Admit: 2018-02-11 | Discharge: 2018-02-12 | Disposition: A | Discharge status: Other Acute Inpatient Hospital | Payer: Self-pay | Attending: Emergency Medicine | Admitting: Emergency Medicine

## 2018-02-11 ENCOUNTER — Other Ambulatory Visit: Payer: Self-pay

## 2018-02-11 DIAGNOSIS — F322 Major depressive disorder, single episode, severe without psychotic features: Secondary | ICD-10-CM | POA: Diagnosis present

## 2018-02-11 DIAGNOSIS — F332 Major depressive disorder, recurrent severe without psychotic features: Secondary | ICD-10-CM | POA: Insufficient documentation

## 2018-02-11 DIAGNOSIS — F329 Major depressive disorder, single episode, unspecified: Secondary | ICD-10-CM

## 2018-02-11 DIAGNOSIS — F17210 Nicotine dependence, cigarettes, uncomplicated: Secondary | ICD-10-CM | POA: Insufficient documentation

## 2018-02-11 DIAGNOSIS — Z79899 Other long term (current) drug therapy: Secondary | ICD-10-CM | POA: Insufficient documentation

## 2018-02-11 LAB — BASIC METABOLIC PANEL
Anion gap: 9 (ref 5–15)
BUN: 11 mg/dL (ref 6–20)
CO2: 20 mmol/L — AB (ref 22–32)
CREATININE: 0.72 mg/dL (ref 0.44–1.00)
Calcium: 9.7 mg/dL (ref 8.9–10.3)
Chloride: 109 mmol/L (ref 98–111)
GFR calc Af Amer: >60 mL/min (ref >60)
GFR calc non Af Amer: >60 mL/min (ref >60)
Glucose, Bld: 99 mg/dL (ref 70–99)
POTASSIUM: 3 mmol/L — AB (ref 3.5–5.1)
SODIUM: 138 mmol/L (ref 135–145)

## 2018-02-11 LAB — URINALYSIS, ROUTINE W REFLEX MICROSCOPIC
Bilirubin Urine: NEGATIVE
Glucose, UA: NEGATIVE mg/dL
Hgb urine dipstick: NEGATIVE
KETONES UR: 5 mg/dL — AB
LEUKOCYTES UA: NEGATIVE
NITRITE: NEGATIVE
PH: 6 (ref 5.0–8.0)
Protein, ur: 30 mg/dL — AB
SPECIFIC GRAVITY, URINE: 1.009 (ref 1.005–1.030)

## 2018-02-11 LAB — CBC WITH DIFFERENTIAL/PLATELET
Basophils Absolute: 0.1 10*3/uL (ref 0.0–0.1)
Basophils Relative: 1 %
Eosinophils Absolute: 0 10*3/uL (ref 0.0–0.7)
Eosinophils Relative: 0 %
HCT: 43.8 % (ref 36.0–46.0)
HEMOGLOBIN: 14.8 g/dL (ref 12.0–15.0)
LYMPHS ABS: 2.8 10*3/uL (ref 0.7–4.0)
LYMPHS PCT: 39 %
MCH: 33 pg (ref 26.0–34.0)
MCHC: 33.8 g/dL (ref 30.0–36.0)
MCV: 97.8 fL (ref 78.0–100.0)
MONOS PCT: 6 %
Monocytes Absolute: 0.4 10*3/uL (ref 0.1–1.0)
NEUTROS PCT: 54 %
Neutro Abs: 3.9 10*3/uL (ref 1.7–7.7)
Platelets: 311 10*3/uL (ref 150–400)
RBC: 4.48 MIL/uL (ref 3.87–5.11)
RDW: 12 % (ref 11.5–15.5)
WBC: 7.2 10*3/uL (ref 4.0–10.5)

## 2018-02-11 LAB — SALICYLATE LEVEL

## 2018-02-11 LAB — ETHANOL: Alcohol, Ethyl (B): <10 mg/dL (ref <10)

## 2018-02-11 LAB — ACETAMINOPHEN LEVEL: Acetaminophen (Tylenol), Serum: 31 ug/mL — ABNORMAL HIGH (ref 10–30)

## 2018-02-11 LAB — RAPID URINE DRUG SCREEN, HOSP PERFORMED
Amphetamines: NOT DETECTED
BARBITURATES: NOT DETECTED
BENZODIAZEPINES: NOT DETECTED
COCAINE: NOT DETECTED
Opiates: NOT DETECTED
Tetrahydrocannabinol: POSITIVE — AB

## 2018-02-11 LAB — PREGNANCY, URINE: Preg Test, Ur: NEGATIVE

## 2018-02-11 MED ORDER — POTASSIUM CHLORIDE CRYS ER 10 MEQ PO TBCR
10.0000 meq | EXTENDED_RELEASE_TABLET | Freq: Every day | ORAL | Status: DC
  Administered 2018-02-12 (×2): 10 meq via ORAL
  Filled 2018-02-11 (×2): qty 1

## 2018-02-11 NOTE — ED Notes (Signed)
Bed: WLPT3 Expected date:  Expected time:  Means of arrival:  Comments: 
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
## 2018-02-11 NOTE — ED Notes (Signed)
Bed: WBH36 Expected date:  Expected time:  Means of arrival:  Comments: 
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
## 2018-02-11 NOTE — BH Assessment (Addendum)
Assessment Note  Lisa Shaw is an 27 y.o. female, who presents voluntary and unaccompanied to [facility]. Clinician asked the pt, "what brought you to the hospital?" Pt reported, she was at her grandfather house when the sheriff served her 50B papers from her mother. Pt reported, after she was served, she was sitting on the porch at her grandfathers' house chain smoking as her mind racied and decided to come to [facility]. Pt reported, earlier today she was suicidal with a plan of overdosing on her medication. Pt reported, her mother feel she (the pt) is a threat to her and her household however, she can get her personal property out of her mothers' home. Pt reported, she feels her mother is trying to get way from her and her mother is all she has. Pt reported, her body and taking a toll on her, she is confused and lost. Pt has a history of epilepsy. Pt reported the following stressors: she has not had a job in two years, she can't afford health insurance, conflict with her mother, no support from her siblings, dad in jail, can't afford cigarettes, can't drive, homeless. Pt reported, her grandfather told her she can stay with but she does not want to pick up after him and others at his house. Pt denies, SI, HI, AVH, self-injurious behaviors and access to weapons.    Pt denies abuse. Pt reported, smoking a blunt/half blunt, three days ago. Pt reported, smoking a half pack of cigarettes, daily. Pt's UDS is positive for marijuana. Pt denies, being linked to OPT resources (medication management and/or counseling.) Pt reported, her PCP prescribed her medications however she had to cancel her appointment because she does not have insurance. Pt denies, previous inpatient admissions.    Pt presents quiet/awake in scrubs with logical/coherent speech. Pt's eye contact was fair. Pt's mood was depressed, anxious, helpless, despair. Pt's mood is congruent with mood. Pt's thought process was coherent/relevant. Pt was oriented  x4. Pt's concentration was normal. Pt's insight and impulse control are fair. Pt reported, if discharged from [facility] she could not contract for safety. Pt reported, if inpatient treatment is recommended she would sign-in voluntarily.   Diagnosis: F33.2 Major Depressive Disorder, recurrent, severe without psychotic features.   Past Medical History:  Past Medical History:  Diagnosis Date  . Depression 04/29/2017  . Epilepsy (HCC)   . Migraine   . Seizures (HCC) 07/18/2016  . Sleep disturbance 04/29/2017    History reviewed. No pertinent surgical history.  Family History:  Family History  Problem Relation Age of Onset  . Seizures Other     Social History:  reports that she has been smoking cigarettes.  She has never used smokeless tobacco. She reports that she has current or past drug history. Drug: Marijuana. She reports that she does not drink alcohol.  Additional Social History:  Alcohol / Drug Use Pain Medications: See MAR Prescriptions: See MAR Over the Counter: See MAR History of alcohol / drug use?: Yes Substance #1 Name of Substance 1: Marijuana.  1 - Age of First Use: UTA 1 - Amount (size/oz): Pt reported, smoking a blunt/half blunt, three days ago.  1 - Frequency: UTA 1 - Duration: UTA 1 - Last Use / Amount: Pt reported, three days ago.  Substance #2 Name of Substance 2: Cigarettes.  2 - Age of First Use: UTA 2 - Amount (size/oz): Pt reported, smoking a half pack of cigarettes, daily.  2 - Frequency: Daily.  2 - Duration: Ongoing.  2 -  Last Use / Amount: Pt reported, daily.   CIWA: CIWA-Ar BP: (!) 144/90 Pulse Rate: (!) 112 COWS:    Allergies:  Allergies  Allergen Reactions  . Keppra [Levetiracetam] Other (See Comments)    Reaction:  Headaches     Home Medications:  (Not in a hospital admission)  OB/GYN Status:  No LMP recorded.  General Assessment Data Location of Assessment: [facility] ED TTS Assessment: In system Is this a Tele or Face-to-Face  Assessment?: Face-to-Face Is this an Initial Assessment or a Re-assessment for this encounter?: Initial Assessment Marital status: Single Is patient pregnant?: No Pregnancy Status: No Living Arrangements: Other (Comment)(but can stay at grandfather house. ) Can pt return to current living arrangement?: Yes Admission Status: Voluntary Is patient capable of signing voluntary admission?: Yes Referral Source: Self/Family/Friend Insurance type: [employer].      Crisis Care Plan Living Arrangements: Other (Comment)(but can stay at grandfather house. ) Legal Guardian: Other:(Self. ) Name of Psychiatrist: NA Name of Therapist: NA  Education Status Is patient currently in school?: No Is the patient employed, unemployed or receiving disability?: Unemployed  Risk to self with the past 6 months Suicidal Ideation: No-Not Currently/Within Last 6 Months Has patient been a risk to self within the past 6 months prior to admission? : Yes Suicidal Intent: No-Not Currently/Within Last 6 Months Has patient had any suicidal intent within the past 6 months prior to admission? : No Is patient at risk for suicide?: Yes Suicidal Plan?: No-Not Currently/Within Last 6 Months Has patient had any suicidal plan within the past 6 months prior to admission? : Yes Access to Means: Yes Specify Access to Suicidal Means: Pt has access to medications.  What has been your use of drugs/alcohol within the last 12 months?: Marijuana.  Previous Attempts/Gestures: No How many times?: 0 Other Self Harm Risks: Pt denies.  Triggers for Past Attempts: None known Intentional Self Injurious Behavior: None(Pt denies. ) Family Suicide History: No Recent stressful life event(s): Conflict (Comment), Loss (Comment), Other (Comment)(homless, conflict with mother, unemployed, dad in jail, ) Persecutory voices/beliefs?: No Depression: Yes Depression Symptoms: Feeling angry/irritable, Feeling worthless/self pity, Loss of  interest in usual pleasures, Guilt, Fatigue, Isolating, Tearfulness, Insomnia, Despondent Substance abuse history and/or treatment for substance abuse?: No Suicide prevention information given to non-admitted patients: Not applicable  Risk to Others within the past 6 months Homicidal Ideation: No(Pt denies. ) Does patient have any lifetime risk of violence toward others beyond the six months prior to admission? : No(Pt denies. ) Thoughts of Harm to Others: No Current Homicidal Intent: No Current Homicidal Plan: No Access to Homicidal Means: No Identified Victim: NA History of harm to others?: No Assessment of Violence: None Noted Violent Behavior Description: NA Does patient have access to weapons?: No(Pt denies. ) Criminal Charges Pending?: Yes Describe Pending Criminal Charges: 50B Does patient have a court date: Yes Court Date: 02/19/18 Is patient on probation?: No  Psychosis Hallucinations: None noted Delusions: None noted  Mental Status Report Appearance/Hygiene: In scrubs Eye Contact: Fair Motor Activity: Unremarkable Speech: Logical/coherent Level of Consciousness: Quiet/awake Mood: Depressed, Anxious, Helpless, Despair Affect: Other (Comment)(congruent with mood. ) Anxiety Level: Panic Attacks Panic attack frequency: UTA Most recent panic attack: Pt reported, last April.  Thought Processes: Coherent, Relevant Judgement: Partial Orientation: Person, Place, Time, Situation Obsessive Compulsive Thoughts/Behaviors: None  Cognitive Functioning Concentration: Normal Memory: Recent Intact Is patient IDD: No Is patient DD?: No Insight: Fair Impulse Control: Fair Appetite: Fair Sleep: Decreased Total Hours of Sleep: (  Pt reported, 4-5 hours. ) Vegetative Symptoms: None  ADLScreening [facility] Assessment Services) Patient's cognitive ability adequate to safely complete daily activities?: Yes Patient able to express need for assistance with ADLs?: Yes Independently  performs ADLs?: Yes (appropriate for developmental age)  Prior Inpatient Therapy Prior Inpatient Therapy: No  Prior Outpatient Therapy Prior Outpatient Therapy: No Does patient have an ACCT team?: No Does patient have Intensive In-House Services?  : No Does patient have [facility] services? : No Does patient have P4CC services?: No  ADL Screening (condition at time of admission) Patient's cognitive ability adequate to safely complete daily activities?: Yes Is the patient deaf or have difficulty hearing?: No Does the patient have difficulty seeing, even when wearing glasses/contacts?: No Does the patient have difficulty concentrating, remembering, or making decisions?: Yes Patient able to express need for assistance with ADLs?: Yes Does the patient have difficulty dressing or bathing?: No Independently performs ADLs?: Yes (appropriate for developmental age) Does the patient have difficulty walking or climbing stairs?: No Weakness of Legs: None Weakness of Arms/Hands: None  Home Assistive Devices/Equipment Home Assistive Devices/Equipment: None    Abuse/Neglect Assessment (Assessment to be complete while patient is alone) Abuse/Neglect Assessment Can Be Completed: Yes Physical Abuse: Denies(Pt denies. ) Verbal Abuse: Denies(Pt denies. ) Sexual Abuse: Denies(Pt denies. ) Exploitation of patient/patient's resources: Denies(Pt denies. ) Self-Neglect: Denies(Pt denies. )     Advance Directives (For Healthcare) Does Patient Have a Medical Advance Directive?: No         Disposition: Nira Conn, NP recommends overnight observation for safety/stabilization and re-evaluation. Disposition discussed with Jerrel Ivory, PA and Joanie Coddington, RN.   Disposition Initial Assessment Completed for this Encounter: Yes  On Site Evaluation by: Redmond Pulling, MS, LPC, CRC. Reviewed with Physician: Jerrel Ivory, [place] and Nira Conn, NP.  Redmond Pulling 02/11/2018 11:47 PM   Redmond Pulling,  MS, [facility], [facility] [place] Triage Specialist [phone]

## 2018-02-12 ENCOUNTER — Encounter (HOSPITAL_COMMUNITY): Payer: Self-pay

## 2018-02-12 ENCOUNTER — Inpatient Hospital Stay (HOSPITAL_COMMUNITY)
Admission: AD | Admit: 2018-02-12 | Discharge: 2018-02-16 | DRG: 885 | Disposition: A | Discharge status: Home / Self Care | Payer: Medicare Other | Source: Intra-hospital | Attending: Emergency Medicine | Admitting: Emergency Medicine

## 2018-02-12 ENCOUNTER — Other Ambulatory Visit: Payer: Self-pay

## 2018-02-12 DIAGNOSIS — R45851 Suicidal ideations: Secondary | ICD-10-CM | POA: Diagnosis present

## 2018-02-12 DIAGNOSIS — F1220 Cannabis dependence, uncomplicated: Secondary | ICD-10-CM

## 2018-02-12 DIAGNOSIS — F428 Other obsessive-compulsive disorder: Secondary | ICD-10-CM | POA: Diagnosis present

## 2018-02-12 DIAGNOSIS — F3342 Major depressive disorder, recurrent, in full remission: Secondary | ICD-10-CM | POA: Diagnosis present

## 2018-02-12 DIAGNOSIS — F3341 Major depressive disorder, recurrent, in partial remission: Secondary | ICD-10-CM | POA: Diagnosis present

## 2018-02-12 DIAGNOSIS — G4700 Insomnia, unspecified: Secondary | ICD-10-CM | POA: Diagnosis present

## 2018-02-12 DIAGNOSIS — Z7951 Long term (current) use of inhaled steroids: Secondary | ICD-10-CM

## 2018-02-12 DIAGNOSIS — Z888 Allergy status to other drugs, medicaments and biological substances status: Secondary | ICD-10-CM

## 2018-02-12 DIAGNOSIS — F17210 Nicotine dependence, cigarettes, uncomplicated: Secondary | ICD-10-CM | POA: Diagnosis present

## 2018-02-12 DIAGNOSIS — L0291 Cutaneous abscess, unspecified: Secondary | ICD-10-CM

## 2018-02-12 DIAGNOSIS — F332 Major depressive disorder, recurrent severe without psychotic features: Secondary | ICD-10-CM

## 2018-02-12 DIAGNOSIS — L02411 Cutaneous abscess of right axilla: Secondary | ICD-10-CM | POA: Diagnosis present

## 2018-02-12 DIAGNOSIS — G40109 Localization-related (focal) (partial) symptomatic epilepsy and epileptic syndromes with simple partial seizures, not intractable, without status epilepticus: Secondary | ICD-10-CM | POA: Diagnosis present

## 2018-02-12 MED ORDER — ONDANSETRON 4 MG PO TBDP
4.0000 mg | ORAL_TABLET | Freq: Once | ORAL | Status: AC
  Administered 2018-02-12: 4 mg via ORAL
  Filled 2018-02-12: qty 1

## 2018-02-12 MED ORDER — HYDROXYZINE HCL 25 MG PO TABS
25.0000 mg | ORAL_TABLET | Freq: Three times a day (TID) | ORAL | Status: DC | PRN
  Administered 2018-02-12 – 2018-02-15 (×6): 25 mg via ORAL
  Filled 2018-02-12: qty 10
  Filled 2018-02-12 (×6): qty 1

## 2018-02-12 MED ORDER — OXCARBAZEPINE 300 MG PO TABS
300.0000 mg | ORAL_TABLET | Freq: Two times a day (BID) | ORAL | Status: DC
  Administered 2018-02-12: 300 mg via ORAL
  Filled 2018-02-12: qty 1

## 2018-02-12 MED ORDER — TOPIRAMATE 100 MG PO TABS: 300.0000 mg | ORAL_TABLET | Freq: Every day | ORAL | Status: DC

## 2018-02-12 MED ORDER — ACETAMINOPHEN 325 MG PO TABS
650.0000 mg | ORAL_TABLET | Freq: Four times a day (QID) | ORAL | Status: DC | PRN
  Administered 2018-02-13 – 2018-02-16 (×5): 650 mg via ORAL
  Filled 2018-02-12 (×5): qty 2

## 2018-02-12 MED ORDER — OXCARBAZEPINE 300 MG PO TABS
300.0000 mg | ORAL_TABLET | Freq: Two times a day (BID) | ORAL | Status: DC
  Administered 2018-02-12 – 2018-02-16 (×8): 300 mg via ORAL
  Filled 2018-02-12: qty 14
  Filled 2018-02-12 (×5): qty 1
  Filled 2018-02-12: qty 14
  Filled 2018-02-12 (×6): qty 1

## 2018-02-12 MED ORDER — TRAZODONE HCL 50 MG PO TABS
50.0000 mg | ORAL_TABLET | Freq: Every evening | ORAL | Status: DC | PRN
  Administered 2018-02-12 – 2018-02-13 (×2): 50 mg via ORAL
  Filled 2018-02-12 (×2): qty 1

## 2018-02-12 MED ORDER — MAGNESIUM HYDROXIDE 400 MG/5ML PO SUSP: 30.0000 mL | Freq: Every day | ORAL | Status: DC | PRN

## 2018-02-12 MED ORDER — ONDANSETRON HCL 4 MG PO TABS: 4.0000 mg | ORAL_TABLET | Freq: Once | ORAL | Status: DC

## 2018-02-12 MED ORDER — TOPIRAMATE 100 MG PO TABS
300.0000 mg | ORAL_TABLET | Freq: Every day | ORAL | Status: DC
  Administered 2018-02-12 – 2018-02-15 (×4): 300 mg via ORAL
  Filled 2018-02-12 (×6): qty 3

## 2018-02-12 MED ORDER — ALUM & MAG HYDROXIDE-SIMETH 200-200-20 MG/5ML PO SUSP: 30.0000 mL | ORAL | Status: DC | PRN

## 2018-02-12 MED ORDER — POTASSIUM CHLORIDE CRYS ER 10 MEQ PO TBCR
10.0000 meq | EXTENDED_RELEASE_TABLET | Freq: Every day | ORAL | Status: DC
Start: 2018-02-13 — End: 2018-02-16
  Administered 2018-02-13 – 2018-02-16 (×4): 10 meq via ORAL
  Filled 2018-02-12: qty 7
  Filled 2018-02-12 (×5): qty 1

## 2018-02-12 NOTE — ED Notes (Signed)
Pt has been pleasant and cooperative all day.  She has been tearful at times.  Pt denies S/I, H/I, and AVH.  She contracts for safety.  15 minute checks and video monitoring continue.

## 2018-02-12 NOTE — ED Notes (Signed)
[phone]  Pending [facility] transport.

## 2018-02-12 NOTE — Consult Note (Addendum)
[place] Psychiatry Consult   Reason for Consult:  Suicidal ideation with a plan Referring Physician:  EDP Patient Identification: Lisa Shaw MRN:  [id number] Principal Diagnosis: Depression Diagnosis:   Patient Active Problem List   Diagnosis Date Noted  . Depression [F32.9] 04/29/2017  . Sleep disturbance [G47.9] 04/29/2017  . Abdominal pain [R10.9] 10/29/2016  . Seizures ([place]) [R56.9] 10/25/2016    Total Time spent with patient: 45 minutes  Subjective:   Lisa Shaw is a 27 y.o. female patient admitted with suicidal ideation with a plan to overdose.   HPI:  Pt was seen and chart reviewed with treatment team and Dr Mariea Clonts. Patient admitted with suicidal ideation with a plan to overdose on her medications. Pt was extremely tearful and depressed during the assessment today. She stated she had been living with her mother and sharing rent and utilities half and half. Pt stated her mother recently moved into a different home and left her without any support and she is now homeless. Pt stated she does everything she can for her family but they just keep using her. Pt has never been hospitalized before but is willing to come into the psychiatric hospital to be stabilized. Pt's UDS positive for THC, BAL negative. Pt stated she has been diagnosed with anxiety in the past but does not take any medications for anxiety. Pt denies auditory/visual hallucinations and denies homicidal ideation. Pt does not appear to be responding to internal stimuli. Pt's lab work is unremarkable, EKG unremarkable. No other diagnostic tests were ordered during this admission. Pt would benefit from an inpatient psychiatric admission for crisis stabilization and medication management.   Past Psychiatric History: As above  Risk to Self: Suicidal Ideation: No-Not Currently/Within Last 6 Months Suicidal Intent: No-Not Currently/Within Last 6 Months Is patient at risk for suicide?: Yes Suicidal Plan?:  No-Not Currently/Within Last 6 Months Access to Means: Yes Specify Access to Suicidal Means: Pt has access to medications.  What has been your use of drugs/alcohol within the last 12 months?: Marijuana.  How many times?: 0 Other Self Harm Risks: Pt denies.  Triggers for Past Attempts: None known Intentional Self Injurious Behavior: None(Pt denies. ) Risk to Others: Homicidal Ideation: No(Pt denies. ) Thoughts of Harm to Others: No Current Homicidal Intent: No Current Homicidal Plan: No Access to Homicidal Means: No Identified Victim: NA History of harm to others?: No Assessment of Violence: None Noted Violent Behavior Description: NA Does patient have access to weapons?: No(Pt denies. ) Criminal Charges Pending?: Yes Describe Pending Criminal Charges: 50B Does patient have a court date: Yes Court Date: 02/19/18 Prior Inpatient Therapy: Prior Inpatient Therapy: No Prior Outpatient Therapy: Prior Outpatient Therapy: No Does patient have an ACCT team?: No Does patient have Intensive In-House Services?  : No Does patient have [facility] services? : No Does patient have P4CC services?: No  Past Medical History:  Past Medical History:  Diagnosis Date  . Depression 04/29/2017  . Epilepsy ([place])   . Migraine   . Seizures ([place]) 07/18/2016  . Sleep disturbance 04/29/2017   History reviewed. No pertinent surgical history. Family History:  Family History  Problem Relation Age of Onset  . Seizures Other    Family Psychiatric  History: Unknown Social History:  Social History   Substance and Sexual Activity  Alcohol Use No     Social History   Substance and Sexual Activity  Drug Use Yes  . Types: Marijuana   Comment: 1/30 avg 1-2 x daily  Social History   Socioeconomic History  . Marital status: Single    Spouse name: Not on file  . Number of children: 0  . Years of education: 12th  . Highest education level: Not on file  Occupational History    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[employer]  . Financial resource strain: Not on file  . Food insecurity:    Worry: Not on file    Inability: Not on file  . Transportation needs:    Medical: Not on file    Non-medical: Not on file  Tobacco Use  . Smoking status: Current Every Day Smoker    Types: Cigarettes  . Smokeless tobacco: Never Used  . Tobacco comment: 1/30 avg 2/day  Substance and Sexual Activity  . Alcohol use: No  . Drug use: Yes    Types: Marijuana    Comment: 1/30 avg 1-2 x daily  . Sexual activity: Yes  Lifestyle  . Physical activity:    Days per week: Not on file    Minutes per session: Not on file  . Stress: Not on file  Relationships  . Social connections:    Talks on phone: Not on file    Gets together: Not on file    Attends religious service: Not on file    Active member of club or organization: Not on file    Attends meetings of clubs or organizations: Not on file    Relationship status: Not on file  Other Topics Concern  . Not on file  Social History Narrative   Patient lives in 2 story home with her mother.   High school education   Additional Social History: N/A    Allergies:   Allergies  Allergen Reactions  . Keppra [Levetiracetam] Other (See Comments)    Reaction:  Headaches     Labs:  Results for orders placed or performed during the hospital encounter of 02/11/18 (from the past 48 hour(s))  CBC with Differential     Status: None   Collection Time: 02/11/18  8:37 PM  Result Value Ref Range   WBC 7.2 4.0 - 10.5 K/uL   RBC 4.48 3.87 - 5.11 MIL/uL   Hemoglobin 14.8 12.0 - 15.0 g/dL   HCT 43.8 36.0 - 46.0 %   MCV 97.8 78.0 - 100.0 fL   MCH 33.0 26.0 - 34.0 pg   MCHC 33.8 30.0 - 36.0 g/dL   RDW 12.0 11.5 - 15.5 %   Platelets 311 150 - 400 K/uL   Neutrophils Relative % 54 %   Neutro Abs 3.9 1.7 - 7.7 K/uL   Lymphocytes Relative 39 %   Lymphs Abs 2.8 0.7 - 4.0 K/uL   Monocytes Relative 6 %   Monocytes Absolute 0.4 0.1 - 1.0 K/uL   Eosinophils  Relative 0 %   Eosinophils Absolute 0.0 0.0 - 0.7 K/uL   Basophils Relative 1 %   Basophils Absolute 0.1 0.0 - 0.1 K/uL    Comment: Performed at [facility], [place] [address] [place], [place] [address]  Basic metabolic panel     Status: Abnormal   Collection Time: 02/11/18  8:37 PM  Result Value Ref Range   Sodium 138 135 - 145 mmol/L   Potassium 3.0 (L) 3.5 - 5.1 mmol/L   Chloride 109 98 - 111 mmol/L   CO2 20 (L) 22 - 32 mmol/L   Glucose, Bld 99 70 - 99 mg/dL   BUN 11 6 - 20 mg/dL  Creatinine, Ser 0.72 0.44 - 1.00 mg/dL   Calcium 9.7 8.9 - 10.3 mg/dL   GFR calc non Af Amer >60 >60 mL/min   GFR calc Af Amer >60 >60 mL/min    Comment: (NOTE) The eGFR has been calculated using the CKD EPI equation. This calculation has not been validated in all clinical situations. eGFR's persistently <60 mL/min signify possible Chronic Kidney Disease.    Anion gap 9 5 - 15    Comment: Performed at [facility], [place] [address], [place], [place] [address]  Salicylate level     Status: None   Collection Time: 02/11/18  8:37 PM  Result Value Ref Range   Salicylate Lvl <6.2 2.8 - 30.0 mg/dL    Comment: Performed at [facility], [place] [address], [place], [place] [address]  Acetaminophen level     Status: Abnormal   Collection Time: 02/11/18  8:37 PM  Result Value Ref Range   Acetaminophen (Tylenol), Serum 31 (H) 10 - 30 ug/mL    Comment: (NOTE) Therapeutic concentrations vary significantly. A range of 10-30 ug/mL  may be an effective concentration for many patients. However, some  are best treated at concentrations outside of this range. Acetaminophen concentrations >150 ug/mL at 4 hours after ingestion  and >50 ug/mL at 12 hours after ingestion are often associated with  toxic reactions. Performed at [facility], [place] [address] [place], [place] [address]   Urinalysis, Routine w reflex microscopic     Status: Abnormal    Collection Time: 02/11/18  8:37 PM  Result Value Ref Range   Color, Urine YELLOW YELLOW   APPearance CLEAR CLEAR   Specific Gravity, Urine 1.009 1.005 - 1.030   pH 6.0 5.0 - 8.0   Glucose, UA NEGATIVE NEGATIVE mg/dL   Hgb urine dipstick NEGATIVE NEGATIVE   Bilirubin Urine NEGATIVE NEGATIVE   Ketones, ur 5 (A) NEGATIVE mg/dL   Protein, ur 30 (A) NEGATIVE mg/dL   Nitrite NEGATIVE NEGATIVE   Leukocytes, UA NEGATIVE NEGATIVE   RBC / HPF 0-5 0 - 5 RBC/hpf   WBC, UA 0-5 0 - 5 WBC/hpf   Bacteria, UA RARE (A) NONE SEEN   Squamous Epithelial / LPF 11-20 0 - 5   Mucus PRESENT     Comment: Performed at [facility], [place] [address], [place], [place] [address]  Rapid urine drug screen (hospital performed)     Status: Abnormal   Collection Time: 02/11/18  8:37 PM  Result Value Ref Range   Opiates NONE DETECTED NONE DETECTED   Cocaine NONE DETECTED NONE DETECTED   Benzodiazepines NONE DETECTED NONE DETECTED   Amphetamines NONE DETECTED NONE DETECTED   Tetrahydrocannabinol POSITIVE (A) NONE DETECTED   Barbiturates NONE DETECTED NONE DETECTED    Comment: (NOTE) DRUG SCREEN FOR MEDICAL PURPOSES ONLY.  IF CONFIRMATION IS NEEDED FOR ANY PURPOSE, NOTIFY LAB WITHIN 5 DAYS. LOWEST DETECTABLE LIMITS FOR URINE DRUG SCREEN Drug Class                     Cutoff (ng/mL) Amphetamine and metabolites    1000 Barbiturate and metabolites    200 Benzodiazepine                 157 Tricyclics and metabolites     300 Opiates and metabolites        300 Cocaine and metabolites        300 THC  50 Performed at [facility], [place] [address] [place], [place] [address]   Ethanol     Status: None   Collection Time: 02/11/18  8:37 PM  Result Value Ref Range   Alcohol, Ethyl (B) <10 <10 mg/dL    Comment: (NOTE) Lowest detectable limit for serum alcohol is 10 mg/dL. For medical purposes only. Performed at [facility], [place]  [address], [place], [place] [address]   Pregnancy, urine     Status: None   Collection Time: 02/11/18  8:37 PM  Result Value Ref Range   Preg Test, Ur NEGATIVE NEGATIVE    Comment:        THE SENSITIVITY OF THIS METHODOLOGY IS >20 mIU/mL. Performed at [facility], [place] [address], [place], [place] [address]     Current Facility-Administered Medications  Medication Dose Route Frequency Provider Last Rate Last Dose  . potassium chloride (K-DUR,KLOR-CON) CR tablet 10 mEq  10 mEq Oral Daily Mortis, Gabrielle I, PA-C   10 mEq at 02/12/18 1103   Current Outpatient Medications  Medication Sig Dispense Refill  . Oxcarbazepine (TRILEPTAL) 300 MG tablet Take 1/2 tablet twice a day for 1 week, then increase to 1 tablet twice a day for 1 week, then increase to 1 tablet twice a day and continue (Patient taking differently: Take 300 mg by mouth 2 (two) times daily. Take 1/2 tablet twice a day for 1 week, then increase to 1 tablet twice a day for 1 week, then increase to 1 tablet twice a day and continue) 120 tablet 6  . topiramate (TOPAMAX) 100 MG tablet Take 3 tablets (300 mg total) by mouth at bedtime. 90 tablet 6  . benzonatate (TESSALON) 100 MG capsule Take 1 capsule (100 mg total) by mouth 3 (three) times daily as needed for cough. (Patient not taking: Reported on 02/11/2018) 21 capsule 0  . fluticasone (FLONASE) 50 MCG/ACT nasal spray Place 1 spray into both nostrils daily. (Patient not taking: Reported on 02/11/2018) 16 g 2  . ibuprofen (ADVIL,MOTRIN) 800 MG tablet Take 1 tablet (800 mg total) by mouth 3 (three) times daily. (Patient not taking: Reported on 02/11/2018) 21 tablet 0  . ondansetron (ZOFRAN) 4 MG tablet 2 tabs by mouth every 8 hours as needed for nausea and vomiting. (Patient not taking: Reported on 02/11/2018) 30 tablet 0    Musculoskeletal: Strength & Muscle Tone: within normal limits Gait & Station: normal Patient leans: N/A  Psychiatric Specialty  Exam: Physical Exam  Nursing note and vitals reviewed. Constitutional: She is oriented to person, place, and time. She appears well-developed and well-nourished.  HENT:  Head: Normocephalic and atraumatic.  Neck: Normal range of motion.  Respiratory: Effort normal.  Musculoskeletal: Normal range of motion.  Neurological: She is alert and oriented to person, place, and time.  Psychiatric: Her speech is normal and behavior is normal. Thought content normal. Her mood appears anxious. Cognition and memory are normal. She expresses impulsivity.    Review of Systems  Psychiatric/Behavioral: Positive for depression. Negative for hallucinations, memory loss, substance abuse and suicidal ideas. The patient is nervous/anxious. The patient does not have insomnia.   All other systems reviewed and are negative.   Blood pressure 111/68, pulse (!) 101, temperature 99.2 F (37.3 C), temperature source Oral, resp. rate 16, SpO2 100 %.There is no height or weight on file to calculate BMI.  General Appearance: Casual  Eye Contact:  Good  Speech:  Clear and Coherent and Normal Rate  Volume:  Normal  Mood:  Depressed  Affect:  Congruent and Depressed  Thought Process:  Coherent and Linear  Orientation:  Full (Time, Place, and Person)  Thought Content:  Rumination  Suicidal Thoughts:  Yes.  with intent/plan  Homicidal Thoughts:  No  Memory:  Immediate;   Good Recent;   Good Remote;   Fair  Judgement:  Fair  Insight:  Fair  Psychomotor Activity:  Normal  Concentration:  Concentration: Good and Attention Span: Good  Recall:  Good  Fund of Knowledge:  Good  Language:  Good  Akathisia:  No  Handed:  Right  AIMS (if indicated):   N/A  Assets:  Communication Skills Desire for Improvement Physical Health Vocational/Educational  ADL's:  Intact  Cognition:  WNL  Sleep:   N/A     Treatment Plan Summary: Daily contact with patient to assess and evaluate symptoms and progress in treatment and  Medication management (see MAR)  Disposition: Recommend psychiatric Inpatient admission when medically cleared. TTS to seek placement.  Ethelene Hal, NP 02/12/2018 11:57 AM   Patient seen face-to-face for psychiatric evaluation, chart reviewed and case discussed with the physician extender and developed treatment plan. Reviewed the information documented and agree with the treatment plan.  Buford Dresser, DO 02/12/18 3:55 PM

## 2018-02-12 NOTE — ED Notes (Signed)
EKG given to EDP,Wickline,MD., for review. 

## 2018-02-12 NOTE — ED Notes (Signed)
Pt A&O x 3, no distress noted, calm & cooperative.  Sleeping at present.  Monitoring for safety, Q 15 min checks in effect.  Pending report to [facility] and [facility] transport. 

## 2018-02-12 NOTE — BH Assessment (Signed)
[facility]  Per Juanetta BeetsJacqueline Norman, DO, this pt requires psychiatric hospitalization at this time.  Berneice Heinrichina [name], RN, [facility]; [facility] will be ready to receive pt at 20:00.  Pt has signed Voluntary Admission and Consent for Treatment, as well as Consent to Release Information to no one, and signed forms [facility].  Pt's nurse, Kendal Hymendie, has been notified, and agrees to send original paperwork along with pt via [facility], and to call report to [phone].  Doylene Canninghomas Manual Navarra, [place] Behavioral Health Coordinator [phone]

## 2018-02-13 DIAGNOSIS — F17210 Nicotine dependence, cigarettes, uncomplicated: Secondary | ICD-10-CM | POA: Diagnosis not present

## 2018-02-13 DIAGNOSIS — F1220 Cannabis dependence, uncomplicated: Secondary | ICD-10-CM | POA: Diagnosis not present

## 2018-02-13 DIAGNOSIS — Z590 Homelessness: Secondary | ICD-10-CM

## 2018-02-13 DIAGNOSIS — F332 Major depressive disorder, recurrent severe without psychotic features: Principal | ICD-10-CM

## 2018-02-13 MED ORDER — FLUOXETINE HCL 20 MG PO CAPS
20.0000 mg | ORAL_CAPSULE | Freq: Every day | ORAL | Status: DC
  Administered 2018-02-13 – 2018-02-16 (×4): 20 mg via ORAL
  Filled 2018-02-13 (×2): qty 1
  Filled 2018-02-13: qty 7
  Filled 2018-02-13 (×3): qty 1

## 2018-02-13 MED ORDER — ADULT MULTIVITAMIN W/MINERALS CH
1.0000 | ORAL_TABLET | Freq: Every day | ORAL | Status: DC
  Administered 2018-02-13 – 2018-02-16 (×4): 1 via ORAL
  Filled 2018-02-13 (×5): qty 1

## 2018-02-13 MED ORDER — BOOST / RESOURCE BREEZE PO LIQD CUSTOM
1.0000 | ORAL | Status: DC
  Administered 2018-02-13 – 2018-02-14 (×2): via ORAL
  Administered 2018-02-15: 1 via ORAL
  Filled 2018-02-13 (×4): qty 1

## 2018-02-13 MED ORDER — ENSURE ENLIVE PO LIQD
237.0000 mL | ORAL | Status: DC
  Administered 2018-02-13 – 2018-02-16 (×2): 237 mL via ORAL
  Filled 2018-02-13: qty 237

## 2018-02-13 MED ORDER — PNEUMOCOCCAL VAC POLYVALENT 25 MCG/0.5ML IJ INJ: 0.5000 mL | INJECTION | INTRAMUSCULAR | Status: DC

## 2018-02-13 MED ORDER — NICOTINE POLACRILEX 2 MG MT GUM
2.0000 mg | CHEWING_GUM | OROMUCOSAL | Status: DC | PRN
  Filled 2018-02-13: qty 1
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
## 2018-02-13 NOTE — Tx Team (Signed)
Interdisciplinary Treatment and Diagnostic Plan Update  02/13/2018 Time of Session: 8:25am Lisa Shaw MRN: [id number]  Principal Diagnosis: <principal problem not specified>  Secondary Diagnoses: Active Problems:   MDD (major depressive disorder), recurrent severe, without psychosis ([place])   Current Medications:  Current Facility-Administered Medications  Medication Dose Route Frequency Provider Last Rate Last Dose  . acetaminophen (TYLENOL) tablet 650 mg  650 mg Oral Q6H PRN Ethelene Hal, NP      . alum & mag hydroxide-simeth (MAALOX/MYLANTA) 200-200-20 MG/5ML suspension 30 mL  30 mL Oral Q4H PRN Ethelene Hal, NP      . hydrOXYzine (ATARAX/VISTARIL) tablet 25 mg  25 mg Oral TID PRN Ethelene Hal, NP   25 mg at 02/12/18 2158  . magnesium hydroxide (MILK OF MAGNESIA) suspension 30 mL  30 mL Oral Daily PRN Ethelene Hal, NP      . Oxcarbazepine (TRILEPTAL) tablet 300 mg  300 mg Oral BID Ethelene Hal, NP   300 mg at 02/13/18 4076  . [START ON 02/14/2018] pneumococcal 23 valent vaccine (PNU-IMMUNE) injection 0.5 mL  0.5 mL Intramuscular Tomorrow-1000 Sharma Covert, MD      . potassium chloride (K-DUR,KLOR-CON) CR tablet 10 mEq  10 mEq Oral Daily Ethelene Hal, NP   10 mEq at 02/13/18 0819  . topiramate (TOPAMAX) tablet 300 mg  300 mg Oral QHS Ethelene Hal, NP   300 mg at 02/12/18 2158  . traZODone (DESYREL) tablet 50 mg  50 mg Oral QHS PRN Ethelene Hal, NP   50 mg at 02/12/18 2158   PTA Medications: Medications Prior to Admission  Medication Sig Dispense Refill Last Dose  . benzonatate (TESSALON) 100 MG capsule Take 1 capsule (100 mg total) by mouth 3 (three) times daily as needed for cough. (Patient not taking: Reported on 02/11/2018) 21 capsule 0 Completed Course at Unknown time  . fluticasone (FLONASE) 50 MCG/ACT nasal spray Place 1 spray into both nostrils daily. (Patient not taking: Reported on 02/11/2018) 16 g  2 Completed Course at Unknown time  . ibuprofen (ADVIL,MOTRIN) 800 MG tablet Take 1 tablet (800 mg total) by mouth 3 (three) times daily. (Patient not taking: Reported on 02/11/2018) 21 tablet 0 Completed Course at Unknown time  . ondansetron (ZOFRAN) 4 MG tablet 2 tabs by mouth every 8 hours as needed for nausea and vomiting. (Patient not taking: Reported on 02/11/2018) 30 tablet 0 Completed Course at Unknown time  . Oxcarbazepine (TRILEPTAL) 300 MG tablet Take 1/2 tablet twice a day for 1 week, then increase to 1 tablet twice a day for 1 week, then increase to 1 tablet twice a day and continue (Patient taking differently: Take 300 mg by mouth 2 (two) times daily. Take 1/2 tablet twice a day for 1 week, then increase to 1 tablet twice a day for 1 week, then increase to 1 tablet twice a day and continue) 120 tablet 6 Past Month at Unknown time  . topiramate (TOPAMAX) 100 MG tablet Take 3 tablets (300 mg total) by mouth at bedtime. 90 tablet 6 02/10/2018 at 2200    Patient Stressors: Financial difficulties Occupational concerns Substance abuse  Patient Strengths: Armed forces logistics/support/administrative officer Physical Health Supportive family/friends  Treatment Modalities: Medication Management, Group therapy, Case management,  1 to 1 session with clinician, Psychoeducation, Recreational therapy.   Physician Treatment Plan for Primary Diagnosis: <principal problem not specified> Long Term Goal(s):     Short Term Goals:    Medication Management: Evaluate  patient's response, side effects, and tolerance of medication regimen.  Therapeutic Interventions: 1 to 1 sessions, Unit Group sessions and Medication administration.  Evaluation of Outcomes: Not Met  Physician Treatment Plan for Secondary Diagnosis: Active Problems:   MDD (major depressive disorder), recurrent severe, without psychosis ([place])  Long Term Goal(s):     Short Term Goals:       Medication Management: Evaluate patient's response, side effects, and  tolerance of medication regimen.  Therapeutic Interventions: 1 to 1 sessions, Unit Group sessions and Medication administration.  Evaluation of Outcomes: Not Met   RN Treatment Plan for Primary Diagnosis: <principal problem not specified> Long Term Goal(s): Knowledge of disease and therapeutic regimen to maintain health will improve  Short Term Goals: Ability to disclose and discuss suicidal ideas, Ability to identify and develop effective coping behaviors will improve and Compliance with prescribed medications will improve  Medication Management: RN will administer medications as ordered by provider, will assess and evaluate patient's response and provide education to patient for prescribed medication. RN will report any adverse and/or side effects to prescribing provider.  Therapeutic Interventions: 1 on 1 counseling sessions, Psychoeducation, Medication administration, Evaluate responses to treatment, Monitor vital signs and CBGs as ordered, Perform/monitor CIWA, COWS, AIMS and Fall Risk screenings as ordered, Perform wound care treatments as ordered.  Evaluation of Outcomes: Not Met   LCSW Treatment Plan for Primary Diagnosis: <principal problem not specified> Long Term Goal(s): Safe transition to appropriate next level of care at discharge, Engage patient in therapeutic group addressing interpersonal concerns.  Short Term Goals: Engage patient in aftercare planning with referrals and resources  Therapeutic Interventions: Assess for all discharge needs, 1 to 1 time with Social worker, Explore available resources and support systems, Assess for adequacy in community support network, Educate family and significant other(s) on suicide prevention, Complete Psychosocial Assessment, Interpersonal group therapy.  Evaluation of Outcomes: Not Met   Progress in Treatment: Attending groups: No. New to unit Participating in groups: No. Taking medication as prescribed: Yes. Toleration  medication: Yes. Family/Significant other contact made: No, will contact:  if patient consents Patient understands diagnosis: Yes. Discussing patient identified problems/goals with staff: Yes. Medical problems stabilized or resolved: Yes. Denies suicidal/homicidal ideation: Yes. Issues/concerns per patient self-inventory: No. Other:   New problem(s) identified: None   New Short Term/Long Term Goal(s):medication stabilization, elimination of SI thoughts, development of comprehensive mental wellness plan.   Patient Goals:  I just want to be able to live a normal life.  Discharge Plan or Barriers: CSW will assess for appropriate referrals and discharge planning  Reason for Continuation of Hospitalization: Depression Medication stabilization Suicidal ideation  Estimated Length of Stay: 3-5 days   Attendees: Patient: 02/13/2018 8:52 AM  Physician: Dr. Myles Lipps, MD 02/13/2018 8:52 AM  Nursing: Yetta Flock, RN 02/13/2018 8:52 AM  RN Care Manager:X 02/13/2018 8:52 AM  Social Worker: Radonna Ricker, [place] 02/13/2018 8:52 AM  Recreational Therapist: Rhunette Croft 02/13/2018 8:52 AM  Other: X 02/13/2018 8:52 AM  Other: X 02/13/2018 8:52 AM  Other:X 02/13/2018 8:52 AM    Scribe for Treatment Team: Marylee Floras, [place] 02/13/2018 8:52 AM

## 2018-02-13 NOTE — Progress Notes (Signed)
Recreation Therapy Notes  Date: 8.2.19 Time: 0930 Location: [address] Dayroom  Group Topic: Stress Management  Goal Area(s) Addresses:  Patient will verbalize importance of using healthy stress management.  Patient will identify positive emotions associated with healthy stress management.   Intervention: Stress Management  Activity : Meditation.  LRT introduced the stress management technique of meditation.  LRT played a meditation on gratitude.  Patients were to follow along as meditation played to engage in activity.  Education:  Stress Management, Discharge Planning.   Education Outcome: Acknowledges edcuation/In group clarification offered/Needs additional education  Clinical Observations/Feedback: Pt did not attend group.     Caroll RancherMarjette Tomy Khim, LRT/CTRS         Lillia AbedLindsay, Theoplis Garciagarcia A 02/13/2018 11:06 AM

## 2018-02-13 NOTE — [facility] Group Notes (Signed)
Adult Psychoeducational Group Note  Date:  02/13/2018  Time: 4:00 PM  Group Topic/Focus: Music as Lisa Coping Skill Patients choose Lisa song of significance to and explain to the group how the song has helped with their recovery.  Participation Level:  Active  Participation Quality:  Appropriate and Attentive  Affect:  Appropriate  Cognitive:  Alert and Oriented  Insight: Improving  Engagement in Group:  Developing/Improving  Modes of Intervention:  Discussion, Activity, and Education  Additional Comments:  Patient was attentive in group, supported of peers and contributed to the discussion.  Lisa Shaw Lisa Shaw 02/13/2018 5:00 PM  

## 2018-02-13 NOTE — Progress Notes (Signed)
Patient ID: Lisa Shaw, female   DOB: 05/27/1991, 27 y.o.   MRN: [id number] Admission Note   Pt is a 27 y/o female admitted onto the [facility] adult unit on a voluntary basis. On admission, Pt appear to be depressed however; cooperative, "my family don't understand me; they are making me go through things I should have to go through." Pt admitted that she has been feeling increasingly depressed and sad for about a week but the conflict with mom triggered the self-harm feeling; "I don't know why she does some of these things to me; we were very good friends." Pt endorsed moderate anxiety, depression and passive SI at this time; "I don't I will harm myself if I have an opportunity right now." Pt denied HI, AVH or pain at this time. Pt also denied recent alcohol use. Support, encouragement, and safe environment provided.  15-minute safety checks initiated and continued. Pt remained cooperative through the admission process. Pt remained alert and oriented through the admission process. Pt searched and no contrabands found. Pt went through several crying episodes during the admission process.

## 2018-02-13 NOTE — Tx Team (Signed)
Initial Treatment Plan 02/13/2018 1:38 AM Lisa Shaw ZOX:[id number]    PATIENT STRESSORS: Financial difficulties Occupational concerns Substance abuse   PATIENT STRENGTHS: Manufacturing systems engineerCommunication skills Physical Health Supportive family/friends   PATIENT IDENTIFIED PROBLEMS: "My family don't understand me"  "I just want to be able to live a normal life"  Depression  Anxiety  Risk for Suicide  Substance Abuse           DISCHARGE CRITERIA:  Ability to meet basic life and health needs Motivation to continue treatment in a less acute level of care Verbal commitment to aftercare and medication compliance  PRELIMINARY DISCHARGE PLAN: Outpatient therapy Return to previous living arrangement  PATIENT/FAMILY INVOLVEMENT: This treatment plan has been presented to and reviewed with the patient, Lisa ChaJasmine D Shaw, and/or family member.  The patient and family have been given the opportunity to ask questions and make suggestions.  Lisa KetoAdediran T Calan Doren, RN 02/13/2018, 1:38 AM
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
## 2018-02-13 NOTE — Plan of Care (Signed)
  Problem: Education: Goal: Knowledge of the prescribed therapeutic regimen will improve Outcome: Progressing   Problem: Activity: Goal: Imbalance in normal sleep/wake cycle will improve Outcome: Progressing   Problem: Safety: Goal: Periods of time without injury will increase Outcome: Progressing   Problem: Education: Goal: Ability to state activities that reduce stress will improve Outcome: Not Progressing

## 2018-02-13 NOTE — Progress Notes (Signed)
D: Pt was in bed in her room upon initial approach.  Pt presents with anxious, depressed affect and mood.  She describes her day as "okay" and reports goal is "trying to get my depression and anxiety down."  She reports feeling "about the same, maybe a little bit better than yesterday."  Pt denies SI/HI, denies hallucinations, reports chronic back pain of 5/10.  Pt has been isolative to her room for the majority of the night.     A: Introduced self to pt.  Actively listened to pt and offered support and encouragement. Medications administered per order.  PRN pain medication offered, pt declined.  Heat packs provided for pain.  PRN medication administered for anxiety and sleep.  Q15 minute safety checks maintained.  R: Pt is safe on the unit.  Pt is compliant with medications.  Pt verbally contracts for safety.  Will continue to monitor and assess.

## 2018-02-13 NOTE — [facility] Counselor (Signed)
Adult Comprehensive Assessment  Patient ID: Lisa Shaw, female   DOB: 08/13/1990, 27 y.o.   MRN: [id number]  Information Source: Information source: Patient  Current Stressors:  Patient states their primary concerns and needs for treatment are:: "Trying to maintain my living and my medical situation" Patient states their goals for this hospitilization and ongoing recovery are:: "Get my depression down" Educational / Learning stressors: Patient denies  Employment / Job issues: Unemployed; Patient reports not having a job is currently a stressor Family Relationships: Patient reports having a strained relationship with her family. Patient reports her family is not supportive. She states that she does not trust her family currently.  Financial / Lack of resources (include bankruptcy): No income; No insurance  Housing / Lack of housing: Patient reports she was living with her mother prior to coming to the hospital. Patient reports her mother recently took out a 50-B order on her.  Physical health (include injuries & life threatening diseases): Patient reports she has a seizure disorder.  Social relationships: Patient reports she does not have any social relationships at this time.  Substance abuse: Patient reports she smoked cannabis 3 days ago. Patient denies any other substance use.  Bereavement / Loss: Patient denies   Living/Environment/Situation:  Living Arrangements: Parent Living conditions (as described by patient or guardian): Good Who else lives in the home?: Parent and sibling  How long has patient lived in current situation?: "All my life" What is atmosphere in current home: Chaotic, Other (Comment)("It is alot, me and my mother do not get along")  Family History:  Marital status: Single Are you sexually active?: No What is your sexual orientation?: Heterosexual  Has your sexual activity been affected by drugs, alcohol, medication, or emotional stress?: No  Does patient  have children?: No  Childhood History:  By whom was/is the patient raised?: Mother Additional childhood history information: Patient reports her father has been in and outof prison majority of her life; Patient reports she does not have a relationship with her father  Description of patient's relationship with caregiver when they were a child: Patient reports having a good and close relationship with her mother during her childhood Patient's description of current relationship with people who raised him/her: Patient reports she currently has a strained relationship with her mother currently.  How were you disciplined when you got in trouble as a child/adolescent?: Spankings, punishment and restrictions Does patient have siblings?: Yes Number of Siblings: 2 Description of patient's current relationship with siblings: Patient reports she currently has a strained relationship with her two older siblings (brother and sister) due to lack of support and jealousy.  Did patient suffer any verbal/emotional/physical/sexual abuse as a child?: No Did patient suffer from severe childhood neglect?: No Has patient ever been sexually abused/assaulted/raped as an adolescent or adult?: No Was the patient ever a victim of a crime or a disaster?: No Witnessed domestic violence?: No Has patient been effected by domestic violence as an adult?: No  Education:  Highest grade of school patient has completed: 12th grade  Currently a student?: No Learning disability?: No  Employment/Work Situation:   Employment situation: Unemployed Patient's job has been impacted by current illness: No What is the longest time patient has a held a job?: 1 year 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?: No Are There Guns or Other Weapons in Your Home?: No  Financial Resources:  Financial resources: No  income Does patient have a Lawyer or guardian?: No  Alcohol/Substance Abuse:   What has been your use of drugs/alcohol within the last 12 months?: Patient reports she smoked cannabis three days ago, however she denies any other substance use If attempted suicide, did drugs/alcohol play a role in this?: No Alcohol/Substance Abuse Treatment Hx: Denies past history Has alcohol/substance abuse ever caused legal problems?: No  Social Support System:   Conservation officer, nature Support System: Poor Describe Community Support System: "I dont have anyone" Type of faith/religion: Christianity  How does patient's faith help to cope with current illness?: Prayer   Leisure/Recreation:   Leisure and Hobbies: "I just go with the flow, I like to clean but thats about it"  Strengths/Needs:   What is the patient's perception of their strengths?: "I'm caring, I'll do anything for anyone, I'm lovable" Patient states they can use these personal strengths during their treatment to contribute to their recovery: Yes  Patient states these barriers may affect/interfere with their treatment: No  Patient states these barriers may affect their return to the community: Current relationship with her mother Other important information patient would like considered in planning for their treatment: No   Discharge Plan:   Currently receiving community mental health services: No Patient states concerns and preferences for aftercare planning are: Outpatient medication management and therapy services Patient states they will know when they are safe and ready for discharge when: Yes  Does patient have access to transportation?: No Does patient have financial barriers related to discharge medications?: Yes Patient description of barriers related to discharge medications: No income, no insurance and lack of support  Plan for no access to transportation at discharge: CSW will asssess for possible options Will  patient be returning to same living situation after discharge?: (To be determined)  Summary/Recommendations:   Summary and Recommendations (to be completed by the evaluator): Lisa Shaw is a 27 year old female who is diagnosed with Major Depressive Disorder, recurrent, severe without psychotic features. She presented to the hospital seeking treatment for worsening depression. Lisa Shaw was pleasant and cooperative with providing information. Lisa Shaw reports she came to the hospital because she has experienced an increase in depressive symtpoms that include suicidal ideation. Lisa Shaw states that her strained relationship with her mother has contributed to her depressive symptoms and that she does not know where she will go moving forward. Lisa Shaw reports that she would like to be referred to an outpatient provider who can help her manage her medications and provide therapy services. Lisa Shaw states that she does not know where she will stay at discharge. Lisa Shaw can benefit from crisis stabilization, medication management, therapeutic milieu and referral services.   Lisa Shaw. 02/13/2018

## 2018-02-14 MED ORDER — TRAZODONE HCL 50 MG PO TABS
100.0000 mg | ORAL_TABLET | Freq: Every evening | ORAL | Status: DC | PRN
  Administered 2018-02-14 – 2018-02-15 (×2): 100 mg via ORAL
  Filled 2018-02-14 (×2): qty 1
  Filled 2018-02-14: qty 7

## 2018-02-14 MED ORDER — DOXYCYCLINE HYCLATE 50 MG PO CAPS: 50.0000 mg | ORAL_CAPSULE | Freq: Two times a day (BID) | ORAL | Status: DC

## 2018-02-14 MED ORDER — CLINDAMYCIN PHOSPHATE 1 % EX SOLN
Freq: Two times a day (BID) | CUTANEOUS | Status: DC
  Administered 2018-02-14 – 2018-02-15 (×3): via TOPICAL
  Administered 2018-02-16: 1 via TOPICAL
  Filled 2018-02-14 (×2): qty 30

## 2018-02-14 MED ORDER — DOXYCYCLINE HYCLATE 50 MG PO CAPS: 50.0000 mg | ORAL_CAPSULE | Freq: Every day | ORAL | Status: DC

## 2018-02-14 NOTE — [facility] Group Notes (Signed)
[facility] Group Notes:  (Nursing/MHT/Case Management/Adjunct)  Date:  02/14/2018  Time:  2:45 PM  Type of Therapy:  Psychoeducational Skills  Participation Level:  Active  Participation Quality:  Appropriate  Affect:  Appropriate  Cognitive:  Appropriate  Insight:  Appropriate  Engagement in Group:  Engaged  Modes of Intervention:  Problem-solving  Summary of Progress/Problems: Pt attended Psychoeducational group with top topic anger management.    Lisa Shaw 02/14/2018, 2:45 PM 

## 2018-02-14 NOTE — [facility] Group Notes (Signed)
LCSW Group Therapy Note  02/14/2018   10:00--11:00am   Type of Therapy and Topic:  Group Therapy: Anger Cues and Responses  Participation Level:  Did Not Attend   Description of Group:   In this group, patients learned how to recognize the physical, cognitive, emotional, and behavioral responses they have to anger-provoking situations.  They identified a recent time they became angry and how they reacted.  They analyzed how their reaction was possibly beneficial and how it was possibly unhelpful.  The group discussed a variety of healthier coping skills that could help with such a situation in the future.  Deep breathing was practiced briefly.  Therapeutic Goals: 1. Patients will remember their last incident of anger and how they felt emotionally and physically, what their thoughts were at the time, and how they behaved. 2. Patients will identify how their behavior at that time worked for them, as well as how it worked against them. 3. Patients will explore possible new behaviors to use in future anger situations. 4. Patients will learn that anger itself is normal and cannot be eliminated, and that healthier reactions can assist with resolving conflict rather than worsening situations.  Summary of Patient Progress:  Did not attend  Therapeutic Modalities:   Cognitive Behavioral Therapy  Zurisadai Helminiak D. Raffi Milstein LCSW   

## 2018-02-14 NOTE — Progress Notes (Signed)
[facility] MD Progress Note  02/14/2018 5:04 PM Lisa Shaw  MRN:  [id number] Subjective: Patient is seen and examined.  Patient is a 27 year old female with a past psychiatric history significant for major depression who was seen in follow-up.  She stated she feels worse than she did on admission.  She feels like the population that in the hospital is causing her problems.  She feels like they are childish.  She stated that during the day she was fatigued all day, but when it got to be nighttime she was unable to sleep.  We discussed increasing her trazodone.  She is thinking about wanting to leave to be discharged.  She stated her mother is out of town working this weekend.  She has discussed things with her over the phone, and things are better with her.  She has several somatic complaints as well.  She is worried she is getting acne.  She denied any suicidal ideation. Principal Problem: <principal problem not specified> Diagnosis:   Patient Active Problem List   Diagnosis Date Noted  . MDD (major depressive disorder), recurrent severe, without psychosis (HCC) [F33.2] 02/12/2018  . Depression [F32.9] 04/29/2017  . Sleep disturbance [G47.9] 04/29/2017  . Abdominal pain [R10.9] 10/29/2016  . Seizures (HCC) [R56.9] 10/25/2016   Total Time spent with patient: 15 minutes  Past Psychiatric History: See admission H&P  Past Medical History:  Past Medical History:  Diagnosis Date  . Depression 04/29/2017  . Epilepsy (HCC)   . Migraine   . Seizures (HCC) 07/18/2016  . Sleep disturbance 04/29/2017   History reviewed. No pertinent surgical history. Family History:  Family History  Problem Relation Age of Onset  . Seizures Other    Family Psychiatric  History: See admission H&P Social History:  Social History   Substance and Sexual Activity  Alcohol Use No     Social History   Substance and Sexual Activity  Drug Use Yes  . Types: Marijuana   Comment: 1/30 avg 1-2 x daily    Social  History   Socioeconomic History  . Marital status: Single    Spouse name: Not on file  . Number of children: 0  . Years of education: 12th  . Highest education level: Not on file  Occupational History    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[employer]  Social Needs  . Financial resource strain: Not on file  . Food insecurity:    Worry: Not on file    Inability: Not on file  . Transportation needs:    Medical: Not on file    Non-medical: Not on file  Tobacco Use  . Smoking status: Current Every Day Smoker    Types: Cigarettes  . Smokeless tobacco: Never Used  . Tobacco comment: 1/30 avg 2/day  Substance and Sexual Activity  . Alcohol use: No  . Drug use: Yes    Types: Marijuana    Comment: 1/30 avg 1-2 x daily  . Sexual activity: Yes  Lifestyle  . Physical activity:    Days per week: Not on file    Minutes per session: Not on file  . Stress: Not on file  Relationships  . Social connections:    Talks on phone: Not on file    Gets together: Not on file    Attends religious service: Not on file    Active member of club or organization: Not on file    Attends meetings of clubs or organizations: Not on file    Relationship status:  Not on file  Other Topics Concern  . Not on file  Social History Narrative   Patient lives in 2 story home with her mother.   High school education   Additional Social History:    Pain Medications: See MAR Prescriptions: See MAR Over the Counter: See MAR History of alcohol / drug use?: Yes Negative Consequences of Use: Financial, Work / School Name of Substance 1: Marijuana.  1 - Age of First Use: UTA 1 - Amount (size/oz): Pt reported, smoking a blunt/half blunt, three days ago.  1 - Frequency: UTA 1 - Duration: UTA 1 - Last Use / Amount: Pt reported, three days ago.                   Sleep: Poor  Appetite:  Fair  Current Medications: Current Facility-Administered Medications  Medication Dose Route Frequency Provider Last Rate Last  Dose  . acetaminophen (TYLENOL) tablet 650 mg  650 mg Oral Q6H PRN Laveda Abbe, NP   650 mg at 02/13/18 1606  . alum & mag hydroxide-simeth (MAALOX/MYLANTA) 200-200-20 MG/5ML suspension 30 mL  30 mL Oral Q4H PRN Laveda Abbe, NP      . doxycycline (VIBRAMYCIN) 50 MG capsule 50 mg  50 mg Oral Q12H Clary, Marlane Mingle, MD      . feeding supplement (BOOST / RESOURCE BREEZE) liquid 1 Container  1 Container Oral Q24H Antonieta Pert, MD      . feeding supplement (ENSURE ENLIVE) (ENSURE ENLIVE) liquid 237 mL  237 mL Oral Q24H Antonieta Pert, MD   237 mL at 02/13/18 1053  . FLUoxetine (PROZAC) capsule 20 mg  20 mg Oral Daily Antonieta Pert, MD   20 mg at 02/14/18 0844  . hydrOXYzine (ATARAX/VISTARIL) tablet 25 mg  25 mg Oral TID PRN Laveda Abbe, NP   25 mg at 02/13/18 2112  . magnesium hydroxide (MILK OF MAGNESIA) suspension 30 mL  30 mL Oral Daily PRN Laveda Abbe, NP      . multivitamin with minerals tablet 1 tablet  1 tablet Oral Daily Antonieta Pert, MD   1 tablet at 02/14/18 0844  . nicotine polacrilex (NICORETTE) gum 2 mg  2 mg Oral PRN Antonieta Pert, MD      . Oxcarbazepine (TRILEPTAL) tablet 300 mg  300 mg Oral BID Laveda Abbe, NP   300 mg at 02/14/18 0844  . pneumococcal 23 valent vaccine (PNU-IMMUNE) injection 0.5 mL  0.5 mL Intramuscular Tomorrow-1000 Antonieta Pert, MD      . potassium chloride (K-DUR,KLOR-CON) CR tablet 10 mEq  10 mEq Oral Daily Laveda Abbe, NP   10 mEq at 02/14/18 0844  . topiramate (TOPAMAX) tablet 300 mg  300 mg Oral QHS Laveda Abbe, NP   300 mg at 02/13/18 2112  . traZODone (DESYREL) tablet 100 mg  100 mg Oral QHS PRN Antonieta Pert, MD        Lab Results: No results found for this or any previous visit (from the past 48 hour(s)).  Blood Alcohol level:  Lab Results  Component Value Date   ETH <10 02/11/2018   ETH <11 07/24/2012    Metabolic Disorder Labs: Lab Results   Component Value Date   HGBA1C 4.5 (L) 01/23/2017   No results found for: PROLACTIN No results found for: CHOL, TRIG, HDL, CHOLHDL, VLDL, LDLCALC  Physical Findings: AIMS: Facial and Oral Movements Muscles of Facial Expression: None, normal Lips and  Perioral Area: None, normal Jaw: None, normal Tongue: None, normal,Extremity Movements Upper (arms, wrists, hands, fingers): None, normal Lower (legs, knees, ankles, toes): None, normal, Trunk Movements Neck, shoulders, hips: None, normal, Overall Severity Severity of abnormal movements (highest score from questions above): None, normal Incapacitation due to abnormal movements: None, normal Patient's awareness of abnormal movements (rate only patient's report): No Awareness, Dental Status Current problems with teeth and/or dentures?: No Does patient usually wear dentures?: No  CIWA:    COWS:     Musculoskeletal: Strength & Muscle Tone: within normal limits Gait & Station: normal Patient leans: N/A  Psychiatric Specialty Exam: Physical Exam  Nursing note and vitals reviewed. Constitutional: She appears well-developed and well-nourished.  HENT:  Head: Normocephalic and atraumatic.  Respiratory: Effort normal.  Neurological: She is alert.    ROS  Blood pressure (!) 135/95, pulse (!) 115, temperature 98.7 F (37.1 C), temperature source Oral, resp. rate 20, height 5' 4.5" (1.638 m), weight 61.2 kg (135 lb), SpO2 98 %.Body mass index is 22.81 kg/m.  General Appearance: Casual  Eye Contact:  Fair  Speech:  Normal Rate  Volume:  Decreased  Mood:  Anxious  Affect:  Congruent  Thought Process:  Coherent  Orientation:  Full (Time, Place, and Person)  Thought Content:  Logical  Suicidal Thoughts:  No  Homicidal Thoughts:  No  Memory:  Immediate;   Fair Recent;   Fair Remote;   Fair  Judgement:  Intact  Insight:  Lacking  Psychomotor Activity:  Normal  Concentration:  Concentration: Fair and Attention Span: Fair  Recall:   [employer]  Fund of Knowledge:  Fair  Language:  Fair  Akathisia:  Negative  Handed:  Right  AIMS (if indicated):     Assets:  Desire for Improvement Resilience  ADL's:  Intact  Cognition:  WNL  Sleep:  Number of Hours: 6.75     Treatment Plan Summary: Daily contact with patient to assess and evaluate symptoms and progress in treatment, Medication management and Plan : Patient is seen and examined.  Patient is a 27 year old female with the above-stated past psychiatric history seen in follow-up.  She has more somatic complaints and other issues.  She is asking to be discharged from the hospital because she feels like the environment is making her worse.  She did not sleep well last night.  She has several somatic complaints including worried about acne.  She has really only been on the fluoxetine for 1 day.  I have suggested increasing the trazodone 200 mg p.o. nightly.  She is in agreement with that.  I tried to find adapalene on the formulary as well as benzoyl peroxide, clindamycin gel as well as topical erythromycin but none of those are on the formulary.  I will give her 50 mg of doxycycline p.o.daily to deal with her acne issues.  I anticipate we will probably end up discharging her tomorrow or Monday.  Antonieta PertGreg Lawson Clary, MD 02/14/2018, 5:04 PM

## 2018-02-14 NOTE — Progress Notes (Signed)
D Pt is observed OOB UAL on the 400 hall to0day. She toelrates this well. She is observed socializing with her peers, watching TV in the 400 hall dayroom. Her affect is flat. She is cooperative.     A She completed her daily assessment and on this she wrote she denied having SI today and she rated her depression, hopelessness and anxiety " 5/5/5/", respectively. She shared persnal feelings in [employer] today.    R Safety is in place.

## 2018-02-14 NOTE — Progress Notes (Signed)
Adult Psychoeducational Group Note  Date:  02/14/2018 Time:  3:24 AM  Group Topic/Focus:  Wrap-Up Group:   The focus of this group is to help patients review their daily goal of treatment and discuss progress on daily workbooks.  Participation Level:  Active  Participation Quality:  Appropriate  Affect:  Appropriate  Cognitive:  Appropriate  Insight: Appropriate  Engagement in Group:  Engaged  Modes of Intervention:  Discussion  Additional Comments:  Pt met her goals of not being isolated and went outside for rec time.Pt rated the day at a 7/10.  Lisa Shaw 02/14/2018, 3:24 AM

## 2018-02-14 NOTE — Plan of Care (Signed)
  Problem: Education: Goal: Ability to state activities that reduce stress will improve Outcome: Progressing   Problem: Coping: Goal: Ability to identify and develop effective coping behavior will improve Outcome: Progressing   

## 2018-02-14 NOTE — Progress Notes (Signed)
D: Pt was in bed in her room upon initial approach.  Pt presents with depressed, anxious affect and mood.  She describes her day as "better."  Goal is "still try to let some of my depression down, I'm working on me."  Pt denies SI/HI, denies hallucinations, reports bilateral hand pain of 7/10.  Pt has been visible in milieu interacting with peers and staff cautiously.    A: Introduced self to pt.  Actively listened to pt and offered support and encouragement. Medications administered per order.  PRN medication administered for pain, anxiety, and sleep.  Heat packs provided for pain.  Q15 minute safety checks maintained.  R: Pt is safe on the unit.  Pt is compliant with medications.  Pt verbally contracts for safety.  Will continue to monitor and assess.

## 2018-02-14 NOTE — Plan of Care (Signed)
  Problem: Coping: Goal: Ability to identify and develop effective coping behavior will improve Outcome: Progressing   

## 2018-02-15 DIAGNOSIS — F332 Major depressive disorder, recurrent severe without psychotic features: Secondary | ICD-10-CM | POA: Diagnosis not present

## 2018-02-15 DIAGNOSIS — F17210 Nicotine dependence, cigarettes, uncomplicated: Secondary | ICD-10-CM | POA: Diagnosis not present

## 2018-02-15 DIAGNOSIS — Z590 Homelessness: Secondary | ICD-10-CM | POA: Diagnosis not present

## 2018-02-15 DIAGNOSIS — F1220 Cannabis dependence, uncomplicated: Secondary | ICD-10-CM | POA: Diagnosis not present

## 2018-02-15 MED ORDER — TRAZODONE HCL 100 MG PO TABS: 100.0000 mg | ORAL_TABLET | Freq: Every evening | ORAL | 0 refills | Status: DC | PRN

## 2018-02-15 MED ORDER — POTASSIUM CHLORIDE CRYS ER 20 MEQ PO TBCR
20.0000 meq | EXTENDED_RELEASE_TABLET | Freq: Once | ORAL | Status: AC
  Administered 2018-02-15: 20 meq via ORAL
  Filled 2018-02-15: qty 1

## 2018-02-15 MED ORDER — FLUOXETINE HCL 20 MG PO CAPS: 20.0000 mg | ORAL_CAPSULE | Freq: Every day | ORAL | 0 refills | Status: DC

## 2018-02-15 MED ORDER — POTASSIUM CHLORIDE CRYS ER 10 MEQ PO TBCR: 10.0000 meq | EXTENDED_RELEASE_TABLET | Freq: Every day | ORAL | 0 refills | Status: DC

## 2018-02-15 MED ORDER — HYDROXYZINE HCL 25 MG PO TABS: 25.0000 mg | ORAL_TABLET | Freq: Three times a day (TID) | ORAL | 0 refills | Status: DC | PRN

## 2018-02-15 MED ORDER — OXCARBAZEPINE 300 MG PO TABS: 300.0000 mg | ORAL_TABLET | Freq: Two times a day (BID) | ORAL | 0 refills | Status: DC

## 2018-02-15 MED ORDER — NICOTINE POLACRILEX 2 MG MT GUM: 2.0000 mg | CHEWING_GUM | OROMUCOSAL | 0 refills | Status: DC | PRN

## 2018-02-15 NOTE — Plan of Care (Signed)
  Problem: Coping: Goal: Ability to identify and develop effective coping behavior will improve Outcome: Progressing   
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
## 2018-02-15 NOTE — [facility] Group Notes (Signed)
[facility]  Date/Time:  02/15/2018  10:00-11:00AM  Type of Therapy and Topic:  Group Therapy:  Healthy and Unhealthy Supports  Participation Level:  Active   Description of Group:  Patients in this group were introduced to the idea of adding a variety of healthy supports to address the various needs in their lives.Patients discussed what additional healthy supports could be helpful in their recovery and wellness after discharge in order to prevent future hospitalizations.   An emphasis was placed on using counselor, doctor, therapy groups, 12-step groups, and problem-specific support groups to expand supports.  They also worked as a group on developing a specific plan for several patients to deal with unhealthy supports through boundary-setting, psychoeducation with loved ones, and even termination of relationships.   Therapeutic Goals:   1)  discuss importance of adding supports to stay well once out of the hospital  2)  compare healthy versus unhealthy supports and identify some examples of each  3)  generate ideas and descriptions of healthy supports that can be added  4)  offer mutual support about how to address unhealthy supports  5)  encourage active participation in and adherence to discharge plan    Summary of Patient Progress:  The patient stated that current healthy supports in her  life is God while current unhealthy supports include the devil.  The patient expressed reluctance to work with a therapist because "that telling her business". The group gave positive examples of building trusting relationships with therapist as well as how these relationships were valuable sources of support.   Therapeutic Modalities:   Motivational Interviewing Brief Solution-Focused Therapy  Evorn Gongonnie D Estela Vinal

## 2018-02-15 NOTE — Progress Notes (Addendum)
D: Pt was in bed in her room upon initial approach.  Pt presents with depressed, anxious affect and mood.  She reports "I'm better but I have a boil under my armpit."  Her goal is to "try to get some sleep."  Pt denies SI/HI, denies hallucinations, reports R axilla pain of 9/10.  Somatically preoccupied with "boil" to R axilla.  Pt has isolated to her room for the majority of the night.  Her interactions with staff have been appropriate.   A: Introduced self to pt.  Actively listened to pt and offered support and encouragement. Medications administered per order.  PRN medication administered for pain, sleep, anxiety.  Q15 minute safety checks maintained.  R: Pt is safe on the unit.  Pt is compliant with medications.  Pt verbally contracts for safety.  Will continue to monitor and assess.

## 2018-02-15 NOTE — [facility] Group Notes (Signed)
[facility] Group Notes:  (Nursing/MHT/Case Management/Adjunct)  Date:  02/15/2018  Time:  1:44 PM  Type of Therapy:  Psychoeducational Skills  Participation Level:  Active  Participation Quality:  Appropriate  Affect:  Appropriate  Cognitive:  Appropriate  Insight:  Appropriate  Engagement in Group:  Engaged  Modes of Intervention:  Problem-solving  Summary of Progress/Problems: Pt attended Psychoeducational group with top topic healthy support systems.  Lisa BalintForrest, Lisa Shaw 02/15/2018, 1:44 PM

## 2018-02-15 NOTE — Progress Notes (Signed)
D Pt is observed OOB UAL on the 400 hall today. She wears hospital-issue red patient scrubs as well as a shower cap over her hair. Her affect remians flat, shy-like and anxious. She speaks so softly her words are barely audible to this Clinical research associatewriter. She makes brief eye contact,appears to be paying attention to what is being said and then turns around and repeats the directions writer gave her.      A SHe is hesitant. She requests this writer go with her to her room-to watch her technique applying Clindagel to her face. This is done. She completed her daily assessment and on this she wrote she deneid SI today and she rated her depression ,hopelessness and anxiety " 3/1/2", respectively. She is offered encouragement by this Clinical research associatewriter, she is praised for attending her groups and therapeutic relationship is fostered.     R Safety is in place.

## 2018-02-15 NOTE — Progress Notes (Signed)
[facility] MD Progress Note  02/15/2018 10:38 AM Lisa Shaw  MRN:  [id number] Subjective: Patient is seen and examined.  Patient's 27 year old female with a past psychiatric history significant for major depression and generalized anxiety disorder seen in follow-up.  She states she is feeling better today.  She did sleep better last night with the trazodone.  She is less anxious.  She continues to be very somatically focused.  I think there may be some component of obsessive-compulsive disorder here.  She continues to point out physical ailments.  There is an old scarred abscess on her left thigh, she is concerned about her hands being stiff, and again the acne as well as a small pimple-like abscess under her left axilla.  She even discussed the fact that she gets frightened and sleeps with her mother, and she understands that "I know it drives her crazy".  We discussed the fact that fluoxetine is a good medication for this, and that with time they might be able to increase it to help her symptoms do better.  I think increasing her dosage at this time is a little bit early given that she is only been on it for a couple of days.  She stated she is going to talk to her mother today to see if she can reconcile so that she is able to return to her home after discharge.  Her potassium is a little low, and that may be causing some of the cramping in her hands.  I have ordered supplemental potassium as well as a potassium level in the a.m. Principal Problem: <principal problem not specified> Diagnosis:   Patient Active Problem List   Diagnosis Date Noted  . MDD (major depressive disorder), recurrent severe, without psychosis (HCC) [F33.2] 02/12/2018  . Depression [F32.9] 04/29/2017  . Sleep disturbance [G47.9] 04/29/2017  . Abdominal pain [R10.9] 10/29/2016  . Seizures (HCC) [R56.9] 10/25/2016   Total Time spent with patient: 15 minutes  Past Psychiatric History: See admission H&P  Past Medical History:   Past Medical History:  Diagnosis Date  . Depression 04/29/2017  . Epilepsy (HCC)   . Migraine   . Seizures (HCC) 07/18/2016  . Sleep disturbance 04/29/2017   History reviewed. No pertinent surgical history. Family History:  Family History  Problem Relation Age of Onset  . Seizures Other    Family Psychiatric  History: See admission H&P Social History:  Social History   Substance and Sexual Activity  Alcohol Use No     Social History   Substance and Sexual Activity  Drug Use Yes  . Types: Marijuana   Comment: 1/30 avg 1-2 x daily    Social History   Socioeconomic History  . Marital status: Single    Spouse name: Not on file  . Number of children: 0  . Years of education: 12th  . Highest education level: Not on file  Occupational History    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[employer]  Social Needs  . Financial resource strain: Not on file  . Food insecurity:    Worry: Not on file    Inability: Not on file  . Transportation needs:    Medical: Not on file    Non-medical: Not on file  Tobacco Use  . Smoking status: Current Every Day Smoker    Types: Cigarettes  . Smokeless tobacco: Never Used  . Tobacco comment: 1/30 avg 2/day  Substance and Sexual Activity  . Alcohol use: No  . Drug use: Yes  Types: Marijuana    Comment: 1/30 avg 1-2 x daily  . Sexual activity: Yes  Lifestyle  . Physical activity:    Days per week: Not on file    Minutes per session: Not on file  . Stress: Not on file  Relationships  . Social connections:    Talks on phone: Not on file    Gets together: Not on file    Attends religious service: Not on file    Active member of club or organization: Not on file    Attends meetings of clubs or organizations: Not on file    Relationship status: Not on file  Other Topics Concern  . Not on file  Social History Narrative   Patient lives in 2 story home with her mother.   High school education   Additional Social History:    Pain  Medications: See MAR Prescriptions: See MAR Over the Counter: See MAR History of alcohol / drug use?: Yes Negative Consequences of Use: Financial, Work / School Name of Substance 1: Marijuana.  1 - Age of First Use: UTA 1 - Amount (size/oz): Pt reported, smoking a blunt/half blunt, three days ago.  1 - Frequency: UTA 1 - Duration: UTA 1 - Last Use / Amount: Pt reported, three days ago.                   Sleep: Fair  Appetite:  Good  Current Medications: Current Facility-Administered Medications  Medication Dose Route Frequency Provider Last Rate Last Dose  . acetaminophen (TYLENOL) tablet 650 mg  650 mg Oral Q6H PRN Laveda Abbe, NP   650 mg at 02/15/18 0842  . alum & mag hydroxide-simeth (MAALOX/MYLANTA) 200-200-20 MG/5ML suspension 30 mL  30 mL Oral Q4H PRN Laveda Abbe, NP      . clindamycin (CLEOCIN T) 1 % external solution   Topical BID Jola Babinski Marlane Mingle, MD      . feeding supplement (BOOST / RESOURCE BREEZE) liquid 1 Container  1 Container Oral Q24H Antonieta Pert, MD      . feeding supplement (ENSURE ENLIVE) (ENSURE ENLIVE) liquid 237 mL  237 mL Oral Q24H Antonieta Pert, MD   237 mL at 02/13/18 1053  . FLUoxetine (PROZAC) capsule 20 mg  20 mg Oral Daily Antonieta Pert, MD   20 mg at 02/15/18 0840  . hydrOXYzine (ATARAX/VISTARIL) tablet 25 mg  25 mg Oral TID PRN Laveda Abbe, NP   25 mg at 02/15/18 0842  . magnesium hydroxide (MILK OF MAGNESIA) suspension 30 mL  30 mL Oral Daily PRN Laveda Abbe, NP      . multivitamin with minerals tablet 1 tablet  1 tablet Oral Daily Antonieta Pert, MD   1 tablet at 02/15/18 0840  . nicotine polacrilex (NICORETTE) gum 2 mg  2 mg Oral PRN Antonieta Pert, MD      . Oxcarbazepine (TRILEPTAL) tablet 300 mg  300 mg Oral BID Laveda Abbe, NP   300 mg at 02/15/18 0840  . pneumococcal 23 valent vaccine (PNU-IMMUNE) injection 0.5 mL  0.5 mL Intramuscular Tomorrow-1000 Antonieta Pert, MD      . potassium chloride (K-DUR,KLOR-CON) CR tablet 10 mEq  10 mEq Oral Daily Laveda Abbe, NP   10 mEq at 02/15/18 0840  . topiramate (TOPAMAX) tablet 300 mg  300 mg Oral QHS Laveda Abbe, NP   300 mg at 02/14/18 2109  . traZODone (DESYREL) tablet 100  mg  100 mg Oral QHS PRN Antonieta Pert, MD   100 mg at 02/14/18 2109    Lab Results: No results found for this or any previous visit (from the past 48 hour(s)).  Blood Alcohol level:  Lab Results  Component Value Date   ETH <10 02/11/2018   ETH <11 07/24/2012    Metabolic Disorder Labs: Lab Results  Component Value Date   HGBA1C 4.5 (L) 01/23/2017   No results found for: PROLACTIN No results found for: CHOL, TRIG, HDL, CHOLHDL, VLDL, LDLCALC  Physical Findings: AIMS: Facial and Oral Movements Muscles of Facial Expression: None, normal Lips and Perioral Area: None, normal Jaw: None, normal Tongue: None, normal,Extremity Movements Upper (arms, wrists, hands, fingers): None, normal Lower (legs, knees, ankles, toes): None, normal, Trunk Movements Neck, shoulders, hips: None, normal, Overall Severity Severity of abnormal movements (highest score from questions above): None, normal Incapacitation due to abnormal movements: None, normal Patient's awareness of abnormal movements (rate only patient's report): No Awareness, Dental Status Current problems with teeth and/or dentures?: No Does patient usually wear dentures?: No  CIWA:    COWS:     Musculoskeletal: Strength & Muscle Tone: within normal limits Gait & Station: normal Patient leans: N/A  Psychiatric Specialty Exam: Physical Exam  Nursing note and vitals reviewed. Constitutional: She is oriented to person, place, and time. She appears well-developed and well-nourished.  HENT:  Head: Normocephalic and atraumatic.  Respiratory: Effort normal.  Neurological: She is alert and oriented to person, place, and time.    ROS  Blood pressure  116/90, pulse (!) 105, temperature 98.4 F (36.9 C), resp. rate 20, height 5' 4.5" (1.638 m), weight 61.2 kg (135 lb), SpO2 98 %.Body mass index is 22.81 kg/m.  General Appearance: Casual  Eye Contact:  Fair  Speech:  Normal Rate  Volume:  Normal  Mood:  Anxious  Affect:  Congruent  Thought Process:  Coherent  Orientation:  Full (Time, Place, and Person)  Thought Content:  Obsessions  Suicidal Thoughts:  No  Homicidal Thoughts:  No  Memory:  Immediate;   Fair Recent;   Fair Remote;   Fair  Judgement:  Intact  Insight:  Fair  Psychomotor Activity:  Increased  Concentration:  Concentration: Good and Attention Span: Good  Recall:  Good  Fund of Knowledge:  Fair  Language:  Good  Akathisia:  Negative  Handed:  Right  AIMS (if indicated):     Assets:  Communication Skills Desire for Improvement Housing Resilience Social Support  ADL's:  Intact  Cognition:  WNL  Sleep:  Number of Hours: 6.75     Treatment Plan Summary: Daily contact with patient to assess and evaluate symptoms and progress in treatment, Medication management and Plan : Patient is seen and examined.  Patient is a 27 year old female with the above-stated past psychiatric history is seen in follow-up.  She is slowly improving.  I do think she has some component of the obsessive-compulsive disorder.  She is very somatically oriented.  Her potassium was slightly low, and were going to supplement that, and recheck her potassium levels.  She does have a small pimple/abscess under her right axilla, and I told nursing that they could use the clindamycin external solution on that as well as her acne.  She continues to be very somatically focused.  She shows me an old scarred abscess on her left hip, and she continues to be fearful about the acne as well as her underlying seizure disorder and pain  in her joints.  I explained that I think this is a component of her anxiety.  She understands that.  Her sleep was improved with the  trazodone, no change there.  She is going to contact her mother to see if she is able to return home.  She also disclosed that she sleeps with her mother a great deal because of her anxiety.  Hopefully the addition of the trazodone and Prozac will improve that situation for her.  If everything goes well we will consider possible discharge on Monday.  Antonieta PertGreg Lawson Golden Gilreath, MD 02/15/2018, 10:38 AM

## 2018-02-16 ENCOUNTER — Encounter (HOSPITAL_COMMUNITY): Payer: Self-pay | Admitting: Emergency Medicine

## 2018-02-16 ENCOUNTER — Other Ambulatory Visit: Payer: Self-pay

## 2018-02-16 DIAGNOSIS — F332 Major depressive disorder, recurrent severe without psychotic features: Secondary | ICD-10-CM | POA: Diagnosis not present

## 2018-02-16 LAB — BASIC METABOLIC PANEL
Anion gap: 8 (ref 5–15)
BUN: 10 mg/dL (ref 6–20)
CHLORIDE: 107 mmol/L (ref 98–111)
CO2: 23 mmol/L (ref 22–32)
Calcium: 9.8 mg/dL (ref 8.9–10.3)
Creatinine, Ser: 0.78 mg/dL (ref 0.44–1.00)
GFR calc Af Amer: >60 mL/min (ref >60)
GFR calc non Af Amer: >60 mL/min (ref >60)
GLUCOSE: 98 mg/dL (ref 70–99)
POTASSIUM: 4.3 mmol/L (ref 3.5–5.1)
Sodium: 138 mmol/L (ref 135–145)

## 2018-02-16 MED ORDER — SULFAMETHOXAZOLE-TRIMETHOPRIM 800-160 MG PO TABS: 1.0000 | ORAL_TABLET | Freq: Two times a day (BID) | ORAL | 0 refills | Status: AC

## 2018-02-16 MED ORDER — LIDOCAINE HCL 2 % IJ SOLN
20.0000 mL | Freq: Once | INTRAMUSCULAR | Status: AC
  Administered 2018-02-16: 400 mg
  Filled 2018-02-16: qty 20

## 2018-02-16 MED ORDER — ACETAMINOPHEN 325 MG PO TABS
650.0000 mg | ORAL_TABLET | Freq: Once | ORAL | Status: AC
  Administered 2018-02-16: 650 mg via ORAL
  Filled 2018-02-16: qty 2

## 2018-02-16 MED FILL — OXcarbazepine 300 MG TABS: 300 | 30 days supply | Qty: 60 | Fill #0

## 2018-02-16 MED FILL — SULFAMETHOXAZOLE-TMP DS TAB: 800-160 | 7 days supply | Qty: 14 | Fill #0

## 2018-02-16 NOTE — Progress Notes (Addendum)
Pt continues to be somatically preoccupied.  She approached writer this morning and asked "do you think I can go across the street?  If I was at home, I would have been done went."  Pt was referring to the "boil" to R axilla and wanting to have it lanced.  Reassurance provided.  Pt encouraged to show axilla to provider today and she was reminded that provider saw pt on 02/15/18 and ordered clindamycin 1% topical BID.  She stated "okay," rolled her eyes, and walked away.  Pt is safe on the unit.  Will continue to monitor and assess.

## 2018-02-16 NOTE — ED Notes (Signed)
Declined W/C at D/C and was escorted to lobby by RN. 

## 2018-02-16 NOTE — ED Triage Notes (Signed)
Pt. Stated, I have several boils that I need to get rid of. Under my rt. Arm , my buttocks. They have been there for a week.

## 2018-02-16 NOTE — Progress Notes (Signed)
Pt received both written and verbal discharge instructions. Pt verbalized understanding of discharge instructions. Pt agreed to f/u appt and med regimen. Pt received d/c packet, sample meds and prescriptions. Pt gathered belongings from room and locker. Pt safely discharged to the lobby.

## 2018-02-16 NOTE — Progress Notes (Signed)
Recreation Therapy Notes  Date: 8.5.19 Time: 0930 Location: 300 [address] Dayroom  Group Topic: Stress Management  Goal Area(s) Addresses:  Patient will verbalize importance of using healthy stress management.  Patient will identify positive emotions associated with healthy stress management.   Intervention: Stress Management  Activity : Guided Imagery.  LRT introduced the stress management technique of guided imagery.  LRT read a script to lead patients on a journey through a meadow.  Patients were to follow along as script was read.  Education: Stress Management, Discharge Planning.   Education Outcome: Acknowledges edcuation/In group clarification offered/Needs additional education  Clinical Observations/Feedback: Pt did not attend group.    Sonyia Muro, LRT/CTRS         Kileigh Ortmann A 02/16/2018 11:30 AM 

## 2018-02-16 NOTE — Progress Notes (Signed)
CSW spoke with pt at bedside. Pt expressed needing help with getting Medicaid as well as transportation. Pt expressed that pt has applied for Medicaid on several occassions and got denied. CSW was informed that pt recently applied again for Medicaid and has a court hearing for it this week. Pt reports that pt has been in need of Medicaid and feels that this will also assist with transportation issues. CSW did inform pt that if Medicaid is received then pt could apply for SCAT through Medicaid. Pt expressed verbal understanding and expressed that pt would go to the DSS Medicaid office in the morning for further needs in regards to Medicaid. There are no further CSW needs at this time. CSW will sign off.   Claude MangesKierra S. Dellene Mcgroarty, MSW, LCSW-A Emergency Department Clinical Social Worker [phone]

## 2018-02-16 NOTE — Progress Notes (Signed)
CSW consulted for transportation needs. CSW went ot speak with pt at bedside however EDP at bedside at this time. CSW to try back later.   Claude MangesKierra S. Rodriguez Aguinaldo, MSW, LCSW-A Emergency Department Clinical Social Worker [phone]

## 2018-02-16 NOTE — [facility] Suicide Risk Assessment (Signed)
[facility] INPATIENT:  Family/Significant Other Suicide Prevention Education  Suicide Prevention Education:  Education Completed; Lisa Shaw, mother, [phone], has been identified by the patient as the family member/significant other with whom the patient will be residing, and identified as the person(s) who will aid the patient in the event of a mental health crisis (suicidal ideations/suicide attempt).  With written consent from the patient, the family member/significant other has been provided the following suicide prevention education, prior to the and/or following the discharge of the patient.  The suicide prevention education provided includes the following:  Suicide risk factors  Suicide prevention and interventions  National Suicide Hotline telephone number  [facility] assessment telephone number  [facility] Assistance 911  [facility] and/or [facility]idential Mobile Crisis [facility] telephone number  Request made of family/significant other to:  Remove weapons (e.g., guns, rifles, knives), all items previously/currently identified as safety concern.  No guns in Grandfather's home, per Lisa Shaw.  Remove drugs/medications (over-the-counter, prescriptions, illicit drugs), all items previously/currently identified as a safety concern.  The family member/significant other verbalizes understanding of the suicide prevention education information provided.  The family member/significant other agrees to remove the items of safety concern listed above.  Pt will be staying with her grandfather.  Lisa Shaw has regular contact with him and will continue to do so.  Wants pt to do mental health follow up because she believes this is part of the current problem.  Lisa Shaw, Lisa Shaw Jon, LCSW 02/16/2018, 8:42 AM

## 2018-02-16 NOTE — Progress Notes (Deleted)
Pt received both written and verbal discharge instructions. Pt verbalized understanding of discharge instructions. Pt agreed to f/u appt and med regimen. Pt received prescriptions, sample meds and d/c packet. Pt gathered belongings from room and locker. Pt safely discharged to the lobby.

## 2018-02-16 NOTE — [facility] Suicide Risk Assessment (Signed)
[facility]   Principal Problem: MDD (major depressive disorder), recurrent severe, without psychosis (HCC) Discharge Diagnoses:  Patient Active Problem List   Diagnosis Date Noted  . MDD (major depressive disorder), recurrent severe, without psychosis (HCC) [F33.2] 02/12/2018  . Depression [F32.9] 04/29/2017  . Sleep disturbance [G47.9] 04/29/2017  . Abdominal pain [R10.9] 10/29/2016  . Seizures (HCC) [R56.9] 10/25/2016    Total Time spent with patient: 15 minutes  Musculoskeletal: Strength & Muscle Tone: within normal limits Gait & Station: normal Patient leans: N/A  Psychiatric Specialty Exam: Review of Systems  All other systems reviewed and are negative.   Blood pressure (!) 123/97, pulse (!) 126, temperature 98.5 F (36.9 C), temperature source Oral, resp. rate 20, height 5' 4.5" (1.638 m), weight 61.2 kg (135 lb), SpO2 100 %.Body mass index is 22.81 kg/m.  General Appearance: Casual  Eye Contact::  Good  Speech:  Normal Rate409  Volume:  Decreased  Mood:  Anxious  Affect:  Congruent  Thought Process:  Coherent  Orientation:  Full (Time, Place, and Person)  Thought Content:  Logical  Suicidal Thoughts:  No  Homicidal Thoughts:  No  Memory:  Immediate;   Fair Recent;   Fair Remote;   Fair  Judgement:  Intact  Insight:  Fair  Psychomotor Activity:  Normal  Concentration:  Good  Recall:  Good  Fund of Knowledge:Fair  Language: Good  Akathisia:  Negative  Handed:  Right  AIMS (if indicated):     Assets:  Desire for Improvement Resilience Social Support  Sleep:  Number of Hours: 6.25  Cognition: WNL  ADL's:  Intact   Mental Status Per Nursing Assessment::   On Admission:  Suicidal ideation indicated by patient, Suicidal ideation indicated by others  Demographic Factors:  Adolescent or young adult, Low socioeconomic status and Unemployed  Loss Factors: NA  Historical Factors: Impulsivity  Risk Reduction Factors:    Living with another person, especially a relative  Continued Clinical Symptoms:  Depression:   Impulsivity  Cognitive Features That Contribute To Risk:  None    Suicide Risk:  Minimal: No identifiable suicidal ideation.  Patients presenting with no risk factors but with morbid ruminations; may be classified as minimal risk based on the severity of the depressive symptoms  Follow-up Information    [facility] Follow up on 02/20/2018.   Specialty:  Behavioral Health Why:  Hospital follow-up on Friday 8/9 at 8:15AM. Please bring the following if you have them: Photo ID, social security card, any proof of income, and hospital discharge paperwork. Thank you.  Contact information: [address] [place] [address] [phone]           Plan Of Care/Follow-up recommendations:  Activity:  ad lib  Antonieta PertGreg Lawson Lucille Witts, MD 02/16/2018, 8:12 AM

## 2018-02-16 NOTE — Discharge Instructions (Addendum)
Please read attached information. If you experience any new or worsening signs or symptoms please return to the emergency room for evaluation. Please follow-up with your primary care provider or specialist as discussed. Please use medication prescribed only as directed and discontinue taking if you have any concerning signs or symptoms.   °

## 2018-02-16 NOTE — ED Provider Notes (Signed)
[facility] EMERGENCY DEPARTMENT Provider Note   CSN: [id number] Arrival date & time: 02/16/18  1156     History   Chief Complaint Chief Complaint  Patient presents with  . Abscess    HPI Lisa Shaw is a 27 y.o. female.  HPI   53 -year-old female presents today with abscess to right axilla. She notes this has been progressing over the last week. She notes a history of the same the  Usually resolve on their own without medical intervention. She denies any fever or chills nausea or vomiting.     Past Medical History:  Diagnosis Date  . Depression 04/29/2017  . Epilepsy (HCC)   . Migraine   . Seizures (HCC) 07/18/2016  . Sleep disturbance 04/29/2017    Patient Active Problem List   Diagnosis Date Noted  . MDD (major depressive disorder), recurrent severe, without psychosis (HCC) 02/12/2018  . Depression 04/29/2017  . Sleep disturbance 04/29/2017  . Abdominal pain 10/29/2016  . Seizures (HCC) 10/25/2016    History reviewed. No pertinent surgical history.   OB History   None      Home Medications    Prior to Admission medications   Medication Sig Start Date End Date Taking? Authorizing Provider  benzonatate (TESSALON) 100 MG capsule Take 1 capsule (100 mg total) by mouth 3 (three) times daily as needed for cough. Patient not taking: Reported on 02/11/2018 10/30/17   Petrucelli, Pleas Koch, PA-C  FLUoxetine (PROZAC) 20 MG capsule Take 1 capsule (20 mg total) by mouth daily. 02/16/18   Oneta Rack, NP  fluticasone (FLONASE) 50 MCG/ACT nasal spray Place 1 spray into both nostrils daily. Patient not taking: Reported on 02/11/2018 10/30/17   Petrucelli, Lelon Mast R, PA-C  hydrOXYzine (ATARAX/VISTARIL) 25 MG tablet Take 1 tablet (25 mg total) by mouth 3 (three) times daily as needed for anxiety. 02/15/18   Oneta Rack, NP  ibuprofen (ADVIL,MOTRIN) 800 MG tablet Take 1 tablet (800 mg total) by mouth 3 (three) times daily. Patient not taking:  Reported on 02/11/2018 10/30/17   Petrucelli, Pleas Koch, PA-C  nicotine polacrilex (NICORETTE) 2 MG gum Take 1 each (2 mg total) by mouth as needed for smoking cessation. 02/15/18   Oneta Rack, NP  ondansetron (ZOFRAN) 4 MG tablet 2 tabs by mouth every 8 hours as needed for nausea and vomiting. Patient not taking: Reported on 02/11/2018 09/17/17   Julieanne Manson, MD  Oxcarbazepine (TRILEPTAL) 300 MG tablet Take 1/2 tablet twice a day for 1 week, then increase to 1 tablet twice a day for 1 week, then increase to 1 tablet twice a day and continue Patient taking differently: Take 300 mg by mouth 2 (two) times daily. Take 1/2 tablet twice a day for 1 week, then increase to 1 tablet twice a day for 1 week, then increase to 1 tablet twice a day and continue 12/23/17   Van Clines, MD  Oxcarbazepine (TRILEPTAL) 300 MG tablet Take 1 tablet (300 mg total) by mouth 2 (two) times daily. 02/15/18   Oneta Rack, NP  potassium chloride (K-DUR,KLOR-CON) 10 MEQ tablet Take 1 tablet (10 mEq total) by mouth daily. 02/16/18   Oneta Rack, NP  sulfamethoxazole-trimethoprim (BACTRIM DS,SEPTRA DS) 800-160 MG tablet Take 1 tablet by mouth 2 (two) times daily for 7 days. 02/16/18 02/23/18  Macguire Holsinger, Tinnie Gens, PA-C  topiramate (TOPAMAX) 100 MG tablet Take 3 tablets (300 mg total) by mouth at bedtime. 12/23/17   Van Clines,  MD  traZODone (DESYREL) 100 MG tablet Take 1 tablet (100 mg total) by mouth at bedtime as needed for sleep. 02/15/18   Oneta Rack, NP    Family History Family History  Problem Relation Age of Onset  . Seizures Other     Social History Social History   Tobacco Use  . Smoking status: Current Every Day Smoker    Types: Cigarettes  . Smokeless tobacco: Never Used  . Tobacco comment: 1/30 avg 2/day  Substance Use Topics  . Alcohol use: No  . Drug use: Yes    Types: Marijuana    Comment: 1/30 avg 1-2 x daily     Allergies   Keppra [levetiracetam]   Review of Systems Review of  Systems  All other systems reviewed and are negative.    Physical Exam Updated Vital Signs BP (!) 123/97 (BP Location: Right Arm)   Pulse (!) 126   Temp 98.5 F (36.9 C) (Oral)   Resp 20   Ht 5' 4.5" (1.638 m)   Wt 61.2 kg (135 lb)   LMP 01/28/2018 Comment: Can't remember LMP  SpO2 100%   BMI 22.81 kg/m    Physical Exam  Constitutional: She is oriented to person, place, and time. She appears well-developed and well-nourished.  HENT:  Head: Normocephalic and atraumatic.  Eyes: Pupils are equal, round, and reactive to light. Conjunctivae are normal. Right eye exhibits no discharge. Left eye exhibits no discharge. No scleral icterus.  Neck: Normal range of motion. No JVD present. No tracheal deviation present.  Pulmonary/Chest: Effort normal. No stridor.  Musculoskeletal:  2 cm abscess right axilla, central fluctuance noted   Neurological: She is alert and oriented to person, place, and time. Coordination normal.  Psychiatric: She has a normal mood and affect. Her behavior is normal. Judgment and thought content normal.  Nursing note and vitals reviewed.   ED Treatments / Results  Labs (all labs ordered are listed, but only abnormal results are displayed) Labs Reviewed  BASIC METABOLIC PANEL    EKG None  Radiology No results found.   EMERGENCY DEPARTMENT US SOFT TISSUE INTERPRETATION "Study: Limited Soft Tissue Ultrasound"  INDICATIONS: Pain and Soft tissue infection Multiple views of the body part were obtained in real-time with a multi-frequency linear probe  PERFORMED BY: Myself IMAGES ARCHIVED?: Yes SIDE:Right  BODY PART:Axilla INTERPRETATION:  Abcess present    Procedures .Marland KitchenIncision and Drainage Date/Time: 02/16/2018 3:43 PM Performed by: Eyvonne Mechanic, PA-C Authorized by: Eyvonne Mechanic, PA-C   Consent:    Consent obtained:  Verbal   Consent given by:  Patient   Risks discussed:  Bleeding, damage to other organs, infection, incomplete drainage  and pain   Alternatives discussed:  Alternative treatment, delayed treatment and no treatment Location:    Type:  Abscess   Size:  2   Location: right axilla  Pre-procedure details:    Skin preparation:  Chloraprep Anesthesia (see MAR for exact dosages):    Anesthesia method:  Local infiltration   Local anesthetic:  Lidocaine 2% w/o epi Procedure type:    Complexity:  Simple Procedure details:    Incision types:  Single straight   Incision depth:  Dermal   Scalpel blade:  11   Wound management:  Probed and deloculated   Drainage:  Purulent   Drainage amount:  Moderate   Wound treatment:  Wound left open   Packing materials:  None Post-procedure details:    Patient tolerance of procedure:  Tolerated well, no immediate complications   (  including critical care time)  Medications Ordered in ED Medications  acetaminophen (TYLENOL) tablet 650 mg (650 mg Oral Given 02/16/18 0235)  alum & mag hydroxide-simeth (MAALOX/MYLANTA) 200-200-20 MG/5ML suspension 30 mL (has no administration in time range)  hydrOXYzine (ATARAX/VISTARIL) tablet 25 mg (25 mg Oral Given 02/15/18 1924)  magnesium hydroxide (MILK OF MAGNESIA) suspension 30 mL (has no administration in time range)  Oxcarbazepine (TRILEPTAL) tablet 300 mg (300 mg Oral Given 02/16/18 0923)  potassium chloride (K-DUR,KLOR-CON) CR tablet 10 mEq (10 mEq Oral Given 02/16/18 0923)  topiramate (TOPAMAX) tablet 300 mg (300 mg Oral Given 02/15/18 2108)  pneumococcal 23 valent vaccine (PNU-IMMUNE) injection 0.5 mL (0.5 mLs Intramuscular Not Given 02/16/18 0950)  FLUoxetine (PROZAC) capsule 20 mg (20 mg Oral Given 02/16/18 0922)  feeding supplement (ENSURE ENLIVE) (ENSURE ENLIVE) liquid 237 mL (237 mLs Oral Given 02/16/18 0926)  feeding supplement (BOOST / RESOURCE BREEZE) liquid 1 Container (1 Container Oral Given 02/15/18 1947)  multivitamin with minerals tablet 1 tablet (1 tablet Oral Given 02/16/18 1610)  nicotine polacrilex (NICORETTE) gum 2 mg (has no  administration in time range)  traZODone (DESYREL) tablet 100 mg (100 mg Oral Given 02/15/18 2108)  clindamycin (CLEOCIN T) 1 % external solution (1 application Topical Given 02/16/18 0925)  potassium chloride SA (K-DUR,KLOR-CON) CR tablet 20 mEq (20 mEq Oral Given 02/15/18 1800)  lidocaine (XYLOCAINE) 2 % (with pres) injection 400 mg (400 mg Infiltration Given 02/16/18 1427)     Initial Impression / Assessment and Plan / ED Course  I have reviewed the triage vital signs and the nursing notes.  Pertinent labs & imaging results that were available during my care of the patient were reviewed by me and considered in my medical decision making (see chart for details).     Labs:   Imaging: BMP  Consults:  Therapeutics:  Discharge Meds:   Assessment/Plan:   27 YO female presents today with abscess uncomplicated I be successful. Patient requesting antibiotics of fundus reasonable she'll be placed on antibiotics wound care instructions given return partial given. She verbalized understanding and agreement to today's plan.   Final Clinical Impressions(s) / ED Diagnoses   Final diagnoses:  Abscess    These medication were not given during my encounter  ED Discharge Orders        Ordered    FLUoxetine (PROZAC) 20 MG capsule  Daily     02/15/18 1510    potassium chloride (K-DUR,KLOR-CON) 10 MEQ tablet  Daily     02/15/18 1510    sulfamethoxazole-trimethoprim (BACTRIM DS,SEPTRA DS) 800-160 MG tablet  2 times daily     02/16/18 1546    hydrOXYzine (ATARAX/VISTARIL) 25 MG tablet  3 times daily PRN     02/15/18 1510    nicotine polacrilex (NICORETTE) 2 MG gum  As needed     02/15/18 1510    traZODone (DESYREL) 100 MG tablet  At bedtime PRN     02/15/18 1510    Oxcarbazepine (TRILEPTAL) 300 MG tablet  2 times daily     02/15/18 1510    Increase activity slowly     02/15/18 1510    Diet - low sodium heart healthy     02/15/18 1510    Discharge instructions    Comments:  Take all  medications as prescribed. Keep all follow-up appointments as scheduled.  Do not consume alcohol or use illegal drugs while on prescription medications. Report any adverse effects from your medications to your primary care provider promptly.  In the event of recurrent symptoms or worsening symptoms, call 911, a crisis hotline, or go to the nearest emergency department for evaluation.   02/15/18 1510       Eyvonne MechanicHedges, Brystol Wasilewski, PA-C 02/16/18 1548    Sabas SousBero, Michael M, MD 02/16/18 [phone]

## 2018-02-16 NOTE — Progress Notes (Signed)
  [facility] Discharge [facility] :  Will you be returning to the same living situation after discharge:  Yes,  with grandfather At discharge, do you have transportation home?: Yes,  brother Do you have the ability to pay for your medications: No. Will work with [facility].  Release of information consent forms completed and in the chart;  Patient's signature needed at discharge.  Patient to Follow up at: Follow-up Information    [facility] Follow up on 02/20/2018.   Specialty:  Behavioral Health Why:  Hospital follow-up on Friday 8/9 at 8:15AM. Please bring the following if you have them: Photo ID, social security card, any proof of income, and hospital discharge paperwork. Thank you.  Contact information: [address] [place] [address] [phone]           Next level of care provider [facility] Link:no  Safety Planning and Suicide Prevention discussed: Yes,  with mother  Have you used any form of tobacco in the last 30 days? (Cigarettes, Smokeless Tobacco, Cigars, and/or Pipes): Yes  Has patient been referred to the Quitline?: Patient refused referral  Patient has been referred for addiction treatment: Yes  [name], Allon Costlow Jon, LCSW 02/16/2018, 10:59 AM

## 2018-02-18 ENCOUNTER — Telehealth: Payer: Self-pay | Admitting: Neurology

## 2018-02-18 NOTE — Telephone Encounter (Signed)
She is taking Trileptal 300mg  BID.  It looks like there are 2 providers prescribing this (Dr. Karel JarvisAquino and Hillery Jacksanika Lewis, NP)

## 2018-02-18 NOTE — Telephone Encounter (Signed)
Please see previous phone encounter.

## 2018-02-18 NOTE — Telephone Encounter (Signed)
Patient needs to talk to someone about her medication. Behavioral health told her to stop taking the Topamax

## 2018-02-18 NOTE — Telephone Encounter (Signed)
Spoke with pt.  [facility].  She does not feel comfortable doing this without Dr. Rosalyn GessAquino's OK.  States that she has continued taking.  Pt states that she woke up around 4am this morning and had urinated herself. Unsure if she had a seizure, no bites.  States that she had a very urgent need to urinate when she woke up as well.  Pt asked if she should go to the emergency department - I advised that incontinence is not an emergent matter.  Pt states that she will bring this up with PCP on Friday.     Dr. Karel JarvisAquino, please advise on Topamax.

## 2018-02-18 NOTE — Telephone Encounter (Signed)
Please ask who her psychiatrist is and request records so we can see what is going on. Is she taking the Trileptal 600mg  BID also? Thanks

## 2018-02-18 NOTE — Telephone Encounter (Signed)
Patient is calling back about this.

## 2018-02-19 NOTE — Telephone Encounter (Signed)
Spoke with pt for clarification [facility].  Pt placed me on hold while she got some papers - when she came back to the phone she stated that Dr. Landry MellowGreg Clary was.  Looking in chart, pt saw Dr. Jola Babinskilary in the Emergency Department for Abscess - no mention of stopping topamax, although it was not listed on pt's AVS medication list. I believe pt may be confused about her doctors, her medications and her appointments.    Dr. Karel JarvisAquino, would you like me to tell pt to continue Topamax?

## 2018-02-19 NOTE — Telephone Encounter (Signed)
Hillery Jacksanika Lewis is maybe her psychiatrist? Looks like med changes were done on 8/4, if we could get records that would be great before I make any changes to Trileptal. Thanks

## 2018-02-20 ENCOUNTER — Ambulatory Visit: Payer: Medicaid Other | Admitting: Internal Medicine

## 2018-02-20 NOTE — Telephone Encounter (Signed)
Yes, continue Topamax for now and let us know when she sees her psychiatrist and who this is. Thanks

## 2018-03-02 MED FILL — TOPIRAMATE 100 MG TABS: 100 | 30 days supply | Qty: 90 | Fill #2

## 2018-03-07 ENCOUNTER — Other Ambulatory Visit: Payer: Self-pay

## 2018-03-07 ENCOUNTER — Emergency Department (HOSPITAL_COMMUNITY): Payer: Medicaid Other

## 2018-03-07 ENCOUNTER — Emergency Department (HOSPITAL_COMMUNITY)
Admission: EM | Admit: 2018-03-07 | Discharge: 2018-03-07 | Disposition: A | Discharge status: Home / Self Care | Payer: Medicaid Other | Attending: Emergency Medicine | Admitting: Emergency Medicine

## 2018-03-07 ENCOUNTER — Encounter (HOSPITAL_COMMUNITY): Payer: Self-pay

## 2018-03-07 DIAGNOSIS — Z9119 Patient's noncompliance with other medical treatment and regimen: Secondary | ICD-10-CM | POA: Insufficient documentation

## 2018-03-07 DIAGNOSIS — R569 Unspecified convulsions: Secondary | ICD-10-CM | POA: Diagnosis not present

## 2018-03-07 DIAGNOSIS — F17210 Nicotine dependence, cigarettes, uncomplicated: Secondary | ICD-10-CM | POA: Insufficient documentation

## 2018-03-07 DIAGNOSIS — Z79899 Other long term (current) drug therapy: Secondary | ICD-10-CM | POA: Insufficient documentation

## 2018-03-07 DIAGNOSIS — G40909 Epilepsy, unspecified, not intractable, without status epilepticus: Secondary | ICD-10-CM | POA: Insufficient documentation

## 2018-03-07 LAB — CBC WITH DIFFERENTIAL/PLATELET
BASOS PCT: 0 %
Basophils Absolute: 0 10*3/uL (ref 0.0–0.1)
EOS ABS: 0 10*3/uL (ref 0.0–0.7)
Eosinophils Relative: 0 %
HCT: 42.9 % (ref 36.0–46.0)
HEMOGLOBIN: 14 g/dL (ref 12.0–15.0)
Lymphocytes Relative: 15 %
Lymphs Abs: 1.3 10*3/uL (ref 0.7–4.0)
MCH: 33 pg (ref 26.0–34.0)
MCHC: 32.6 g/dL (ref 30.0–36.0)
MCV: 101.2 fL — ABNORMAL HIGH (ref 78.0–100.0)
Monocytes Absolute: 0.3 10*3/uL (ref 0.1–1.0)
Monocytes Relative: 3 %
Neutro Abs: 7.4 10*3/uL (ref 1.7–7.7)
Neutrophils Relative %: 82 %
PLATELETS: 315 10*3/uL (ref 150–400)
RBC: 4.24 MIL/uL (ref 3.87–5.11)
RDW: 12.4 % (ref 11.5–15.5)
WBC: 9 10*3/uL (ref 4.0–10.5)

## 2018-03-07 LAB — BASIC METABOLIC PANEL
Anion gap: 8 (ref 5–15)
BUN: 9 mg/dL (ref 6–20)
CHLORIDE: 109 mmol/L (ref 98–111)
CO2: 25 mmol/L (ref 22–32)
CREATININE: 0.62 mg/dL (ref 0.44–1.00)
Calcium: 9.3 mg/dL (ref 8.9–10.3)
Glucose, Bld: 94 mg/dL (ref 70–99)
POTASSIUM: 3.5 mmol/L (ref 3.5–5.1)
SODIUM: 142 mmol/L (ref 135–145)

## 2018-03-07 LAB — RAPID URINE DRUG SCREEN, HOSP PERFORMED
AMPHETAMINES: NOT DETECTED
Barbiturates: NOT DETECTED
Benzodiazepines: NOT DETECTED
COCAINE: NOT DETECTED
OPIATES: NOT DETECTED
Tetrahydrocannabinol: POSITIVE — AB

## 2018-03-07 LAB — ETHANOL: Alcohol, Ethyl (B): <10 mg/dL (ref <10)

## 2018-03-07 MED ORDER — FENTANYL CITRATE (PF) 100 MCG/2ML IJ SOLN
50.0000 ug | Freq: Once | INTRAMUSCULAR | Status: AC
  Administered 2018-03-07: 50 ug via INTRAVENOUS
  Filled 2018-03-07: qty 2

## 2018-03-07 MED ORDER — OXCARBAZEPINE 300 MG PO TABS: 300.0000 mg | ORAL_TABLET | Freq: Two times a day (BID) | ORAL | 1 refills | Status: DC

## 2018-03-07 MED ORDER — LORAZEPAM 2 MG/ML IJ SOLN
2.0000 mg | Freq: Once | INTRAMUSCULAR | Status: AC
  Administered 2018-03-07: 2 mg via INTRAVENOUS
  Filled 2018-03-07: qty 1

## 2018-03-07 MED ORDER — SODIUM CHLORIDE 0.9 % IV BOLUS
1000.0000 mL | Freq: Once | INTRAVENOUS | Status: AC
  Administered 2018-03-07: 1000 mL via INTRAVENOUS

## 2018-03-07 MED ORDER — TOPIRAMATE 100 MG PO TABS: 100.0000 mg | ORAL_TABLET | Freq: Every day | ORAL | 1 refills | Status: DC

## 2018-03-07 MED ORDER — OXCARBAZEPINE 300 MG PO TABS
300.0000 mg | ORAL_TABLET | Freq: Once | ORAL | Status: AC
  Administered 2018-03-07: 300 mg via ORAL
  Filled 2018-03-07: qty 1

## 2018-03-07 MED ORDER — SODIUM CHLORIDE 0.9 % IV SOLN
500.0000 mg | Freq: Once | INTRAVENOUS | Status: AC
  Administered 2018-03-07: 500 mg via INTRAVENOUS
  Filled 2018-03-07: qty 10

## 2018-03-07 NOTE — ED Notes (Signed)
Bed: [id number] Expected date:  Expected time:  Means of arrival:  Comments: 31s F seizure, hx of same

## 2018-03-07 NOTE — ED Notes (Signed)
Patient had seizure lasting approximately 1.5 to 2 minutes, patient turned to side. EDP at bedside.

## 2018-03-07 NOTE — ED Triage Notes (Signed)
Arrived via [facility] with c/o seizures. EMS report 3 seizures in the past 3 hr. Pt reports compliance with meds.

## 2018-03-07 NOTE — ED Notes (Signed)
Patient alert to self and place. C/o headache. EDP notified.

## 2018-03-07 NOTE — Discharge Instructions (Addendum)
Make sure a family member stays with you today.  Refills for your seizure medication.  Follow-up with [facility].  Phone number given.

## 2018-03-07 NOTE — ED Provider Notes (Signed)
[facility]-EMERGENCY DEPT Provider Note   CSN: [id number] Arrival date & time: 03/07/18  0701     History   Chief Complaint Chief Complaint  Patient presents with  . Seizures    HPI Lisa Shaw is a 27 y.o. female.  Level 5 caveat for postictal state secondary to seizures.  Patient has a known seizure disorder.  She ran out of her medication a couple of days ago.  She allegedly had a grand mal seizure just prior to emergency visit.  On my initial exam, the patient was sleepy but answered questions appropriately.  Soon after the initial exam, she had a tonic-clonic seizure.  No prodromal illnesses.     Past Medical History:  Diagnosis Date  . Depression 04/29/2017  . Epilepsy (HCC)   . Migraine   . Seizures (HCC) 07/18/2016  . Sleep disturbance 04/29/2017    Patient Active Problem List   Diagnosis Date Noted  . MDD (major depressive disorder), recurrent severe, without psychosis (HCC) 02/12/2018  . Depression 04/29/2017  . Sleep disturbance 04/29/2017  . Abdominal pain 10/29/2016  . Seizures (HCC) 10/25/2016    History reviewed. No pertinent surgical history.   OB History   None      Home Medications    Prior to Admission medications   Medication Sig Start Date End Date Taking? Authorizing Provider  citalopram (CELEXA) 20 MG tablet Take 10 mg by mouth daily. 02/11/17  Yes [provider]  FLUoxetine (PROZAC) 20 MG capsule Take 1 capsule (20 mg total) by mouth daily. 02/16/18  Yes Oneta Rack, NP  hydrOXYzine (ATARAX/VISTARIL) 25 MG tablet Take 1 tablet (25 mg total) by mouth 3 (three) times daily as needed for anxiety. 02/15/18  Yes Oneta Rack, NP  nicotine polacrilex (NICORETTE) 2 MG gum Take 1 each (2 mg total) by mouth as needed for smoking cessation. 02/15/18  Yes Oneta Rack, NP  Oxcarbazepine (TRILEPTAL) 300 MG tablet Take 1 tablet (300 mg total) by mouth 2 (two) times daily. 02/15/18  Yes Oneta Rack, NP    potassium chloride (K-DUR,KLOR-CON) 10 MEQ tablet Take 1 tablet (10 mEq total) by mouth daily. 02/16/18  Yes Oneta Rack, NP  topiramate (TOPAMAX) 100 MG tablet Take 3 tablets (300 mg total) by mouth at bedtime. 12/23/17  Yes Van Clines, MD  traZODone (DESYREL) 100 MG tablet Take 1 tablet (100 mg total) by mouth at bedtime as needed for sleep. 02/15/18  Yes Oneta Rack, NP  Oxcarbazepine (TRILEPTAL) 300 MG tablet Take 1 tablet (300 mg total) by mouth 2 (two) times daily. 03/07/18   Donnetta Hutching, MD  topiramate (TOPAMAX) 100 MG tablet Take 1 tablet (100 mg total) by mouth daily. 3 tablets daily at bedtime. 03/07/18   Donnetta Hutching, MD    Family History Family History  Problem Relation Age of Onset  . Seizures Other     Social History Social History   Tobacco Use  . Smoking status: Current Every Day Smoker    Types: Cigarettes  . Smokeless tobacco: Never Used  . Tobacco comment: 1/30 avg 2/day  Substance Use Topics  . Alcohol use: No  . Drug use: Yes    Types: Marijuana    Comment: 1/30 avg 1-2 x daily     Allergies   Keppra [levetiracetam]   Review of Systems Review of Systems  Unable to perform ROS: Acuity of condition     Physical Exam Updated Vital Signs BP Marland Kitchen)  95/56   Pulse 87   Temp 98.3 F (36.8 C) (Oral)   Resp (!) 23   Ht 5\' 7"  (1.702 m)   Wt 62.6 kg   SpO2 (!) 78%   BMI 21.61 kg/m   Physical Exam  Constitutional:  Sleepy, but arousable  HENT:  Head: Normocephalic and atraumatic.  Eyes: Conjunctivae are normal.  Neck: Neck supple.  Cardiovascular: Normal rate and regular rhythm.  Pulmonary/Chest: Effort normal and breath sounds normal.  Abdominal: Soft. Bowel sounds are normal.  Musculoskeletal: Normal range of motion.  Neurological: She is alert.  Skin: Skin is warm and dry.  Psychiatric: She has a normal mood and affect. Her behavior is normal.  Nursing note and vitals reviewed.    ED Treatments / Results  Labs (all labs ordered  are listed, but only abnormal results are displayed) Labs Reviewed  CBC WITH DIFFERENTIAL/PLATELET - Abnormal; Notable for the following components:      Result Value   MCV 101.2 (*)    All other components within normal limits  RAPID URINE DRUG SCREEN, HOSP PERFORMED - Abnormal; Notable for the following components:   Tetrahydrocannabinol POSITIVE (*)    All other components within normal limits  BASIC METABOLIC PANEL  ETHANOL    EKG None  Radiology Ct Head Wo Contrast  Result Date: 03/07/2018 CLINICAL DATA:  Seizure today EXAM: CT HEAD WITHOUT CONTRAST TECHNIQUE: Contiguous axial images were obtained from the base of the skull through the vertex without intravenous contrast. COMPARISON:  06/19/2012 FINDINGS: Despite efforts by the technologist and patient, motion artifact is present on today's exam and could not be eliminated. This reduces exam sensitivity and specificity. Brain: The brainstem, cerebellum, cerebral peduncles, thalami, basal ganglia, basilar cisterns, and ventricular system appear within normal limits. No intracranial hemorrhage, mass lesion, or acute CVA. Vascular: Unremarkable Skull: Unremarkable Sinuses/Orbits: Unremarkable where included. Other: No supplemental non-categorized findings. IMPRESSION: 1. No significant abnormality identified. Electronically Signed   By: Gaylyn Rong M.D.   On: 03/07/2018 10:11    Procedures Procedures (including critical care time)  Medications Ordered in ED Medications  sodium chloride 0.9 % bolus 1,000 mL ( Intravenous Stopped 03/07/18 0933)  LORazepam (ATIVAN) injection 2 mg (2 mg Intravenous Given 03/07/18 0837)  phenytoin (DILANTIN) 500 mg in sodium chloride 0.9 % 100 mL IVPB (0 mg Intravenous Stopped 03/07/18 1009)  fentaNYL (SUBLIMAZE) injection 50 mcg (50 mcg Intravenous Given 03/07/18 1012)  sodium chloride 0.9 % bolus 1,000 mL ( Intravenous Stopped 03/07/18 1132)  Oxcarbazepine (TRILEPTAL) tablet 300 mg (300 mg Oral Given  03/07/18 1401)     Initial Impression / Assessment and Plan / ED Course  I have reviewed the triage vital signs and the nursing notes.  Pertinent labs & imaging results that were available during my care of the patient were reviewed by me and considered in my medical decision making (see chart for details).     Patient who has a known history of a seizure disorder presents with a seizure today.  She has been noncompliant with her medications secondary to financial concerns.  She had another seizure in the emergency department and responded well to IV Ativan and IV Dilantin.  She is allergic to Keppra.  She was observed for several hours.  She was hemodynamically stable at discharge.  Discussed diagnosis with the patient and her family members.  Will restart her seizure medication Trileptal 300 mg twice a day and Topamax 100 mg 3 tablets at bedtime.  Referral to [facility]  Neurological    CRITICAL CARE Performed by: Donnetta HutchingBrian Tiyonna Sardinha Total critical care time: 35 minutes Critical care time was exclusive of separately billable procedures and treating other patients. Critical care was necessary to treat or prevent imminent or life-threatening deterioration. Critical care was time spent personally by me on the following activities: development of treatment plan with patient and/or surrogate as well as nursing, discussions with consultants, evaluation of patient's response to treatment, examination of patient, obtaining history from patient or surrogate, ordering and performing treatments and interventions, ordering and review of laboratory studies, ordering and review of radiographic studies, pulse oximetry and re-evaluation of patient's condition. Final Clinical Impressions(s) / ED Diagnoses   Final diagnoses:  Seizure [facility](HCC)    ED Discharge Orders         Ordered    Oxcarbazepine (TRILEPTAL) 300 MG tablet  2 times daily     03/07/18 1345    topiramate (TOPAMAX) 100 MG tablet  Daily     03/07/18  1345           Donnetta Hutchingook, Miette Molenda, MD 03/07/18 1408

## 2018-03-17 ENCOUNTER — Telehealth: Payer: Self-pay | Admitting: Neurology

## 2018-03-17 NOTE — Telephone Encounter (Signed)
Patient wants to talk to someone about her having four seizures and also about something in her medical record

## 2018-03-17 NOTE — Telephone Encounter (Signed)
Returned call to pt.  No answer.  LMOM asking for return call.  

## 2018-03-30 ENCOUNTER — Ambulatory Visit: Payer: Medicaid Other | Admitting: Internal Medicine

## 2018-04-08 MED FILL — TOPIRAMATE 100 MG TABS: 100 | 30 days supply | Qty: 90 | Fill #3

## 2018-05-11 MED FILL — TOPIRAMATE 100 MG TABS: 100 | 30 days supply | Qty: 90 | Fill #4

## 2018-05-14 ENCOUNTER — Encounter (HOSPITAL_COMMUNITY): Payer: Self-pay | Admitting: Emergency Medicine

## 2018-05-14 ENCOUNTER — Emergency Department (HOSPITAL_COMMUNITY)
Admission: EM | Admit: 2018-05-14 | Discharge: 2018-05-14 | Disposition: A | Discharge status: Home / Self Care | Payer: Medicaid Other | Attending: Emergency Medicine | Admitting: Emergency Medicine

## 2018-05-14 ENCOUNTER — Emergency Department (HOSPITAL_COMMUNITY): Payer: Medicaid Other

## 2018-05-14 DIAGNOSIS — N939 Abnormal uterine and vaginal bleeding, unspecified: Secondary | ICD-10-CM

## 2018-05-14 DIAGNOSIS — R109 Unspecified abdominal pain: Secondary | ICD-10-CM

## 2018-05-14 DIAGNOSIS — R112 Nausea with vomiting, unspecified: Secondary | ICD-10-CM | POA: Insufficient documentation

## 2018-05-14 DIAGNOSIS — K0889 Other specified disorders of teeth and supporting structures: Secondary | ICD-10-CM

## 2018-05-14 DIAGNOSIS — Z79899 Other long term (current) drug therapy: Secondary | ICD-10-CM | POA: Diagnosis not present

## 2018-05-14 DIAGNOSIS — F17210 Nicotine dependence, cigarettes, uncomplicated: Secondary | ICD-10-CM | POA: Diagnosis not present

## 2018-05-14 LAB — URINALYSIS, ROUTINE W REFLEX MICROSCOPIC
BACTERIA UA: NONE SEEN
Bilirubin Urine: NEGATIVE
Glucose, UA: NEGATIVE mg/dL
Ketones, ur: 20 mg/dL — AB
Leukocytes, UA: NEGATIVE
NITRITE: NEGATIVE
PROTEIN: 30 mg/dL — AB
Specific Gravity, Urine: >1.046 — ABNORMAL HIGH (ref 1.005–1.030)
pH: 7 (ref 5.0–8.0)

## 2018-05-14 LAB — WET PREP, GENITAL
CLUE CELLS WET PREP: NONE SEEN
Sperm: NONE SEEN
Trich, Wet Prep: NONE SEEN
Yeast Wet Prep HPF POC: NONE SEEN

## 2018-05-14 LAB — COMPREHENSIVE METABOLIC PANEL
ALK PHOS: 68 U/L (ref 38–126)
ALT: 17 U/L (ref 0–44)
AST: 21 U/L (ref 15–41)
Albumin: 4.6 g/dL (ref 3.5–5.0)
Anion gap: 9 (ref 5–15)
BILIRUBIN TOTAL: 0.5 mg/dL (ref 0.3–1.2)
BUN: 12 mg/dL (ref 6–20)
CALCIUM: 9.3 mg/dL (ref 8.9–10.3)
CHLORIDE: 112 mmol/L — AB (ref 98–111)
CO2: 18 mmol/L — ABNORMAL LOW (ref 22–32)
CREATININE: 0.8 mg/dL (ref 0.44–1.00)
Glucose, Bld: 102 mg/dL — ABNORMAL HIGH (ref 70–99)
Potassium: 4.1 mmol/L (ref 3.5–5.1)
Sodium: 139 mmol/L (ref 135–145)
TOTAL PROTEIN: 7.5 g/dL (ref 6.5–8.1)

## 2018-05-14 LAB — CBC
HCT: 40.6 % (ref 36.0–46.0)
Hemoglobin: 13.1 g/dL (ref 12.0–15.0)
MCH: 33.1 pg (ref 26.0–34.0)
MCHC: 32.3 g/dL (ref 30.0–36.0)
MCV: 102.5 fL — AB (ref 80.0–100.0)
NRBC: 0 % (ref 0.0–0.2)
Platelets: 299 10*3/uL (ref 150–400)
RBC: 3.96 MIL/uL (ref 3.87–5.11)
RDW: 12 % (ref 11.5–15.5)
WBC: 8.6 10*3/uL (ref 4.0–10.5)

## 2018-05-14 LAB — LIPASE, BLOOD: LIPASE: 26 U/L (ref 11–51)

## 2018-05-14 LAB — I-STAT BETA HCG BLOOD, ED (MC, WL, AP ONLY)

## 2018-05-14 MED ORDER — AMOXICILLIN-POT CLAVULANATE 875-125 MG PO TABS: 1.0000 | ORAL_TABLET | Freq: Two times a day (BID) | ORAL | 0 refills | Status: DC

## 2018-05-14 MED ORDER — SODIUM CHLORIDE 0.9 % IJ SOLN
INTRAMUSCULAR | Status: AC
  Filled 2018-05-14: qty 50

## 2018-05-14 MED ORDER — IOPAMIDOL (ISOVUE-300) INJECTION 61%
100.0000 mL | Freq: Once | INTRAVENOUS | Status: AC | PRN
  Administered 2018-05-14: 100 mL via INTRAVENOUS

## 2018-05-14 MED ORDER — SODIUM CHLORIDE 0.9 % IV BOLUS
1000.0000 mL | Freq: Once | INTRAVENOUS | Status: AC
  Administered 2018-05-14: 1000 mL via INTRAVENOUS

## 2018-05-14 MED ORDER — DICYCLOMINE HCL 10 MG PO CAPS
10.0000 mg | ORAL_CAPSULE | Freq: Once | ORAL | Status: AC
  Administered 2018-05-14: 10 mg via ORAL
  Filled 2018-05-14: qty 1

## 2018-05-14 MED ORDER — DICYCLOMINE HCL 20 MG PO TABS: 20.0000 mg | ORAL_TABLET | Freq: Two times a day (BID) | ORAL | 0 refills | Status: DC

## 2018-05-14 MED ORDER — ONDANSETRON HCL 4 MG PO TABS: 4.0000 mg | ORAL_TABLET | Freq: Three times a day (TID) | ORAL | 0 refills | Status: DC | PRN

## 2018-05-14 MED ORDER — IOPAMIDOL (ISOVUE-300) INJECTION 61%
INTRAVENOUS | Status: AC
  Administered 2018-05-14: 100 mL via INTRAVENOUS
  Filled 2018-05-14: qty 100

## 2018-05-14 MED ORDER — KETOROLAC TROMETHAMINE 15 MG/ML IJ SOLN
15.0000 mg | Freq: Once | INTRAMUSCULAR | Status: AC
  Administered 2018-05-14: 15 mg via INTRAVENOUS
  Filled 2018-05-14: qty 1

## 2018-05-14 MED ORDER — ONDANSETRON HCL 4 MG/2ML IJ SOLN
4.0000 mg | Freq: Once | INTRAMUSCULAR | Status: AC
  Administered 2018-05-14: 4 mg via INTRAVENOUS
  Filled 2018-05-14: qty 2

## 2018-05-14 NOTE — Discharge Instructions (Addendum)
Please take the medications as prescribed in your discharge paperwork.  You were given information for the women's outpatient clinic, please call the office to make an appointment for follow-up if you continue to have vaginal bleeding.  Please follow up with your primary doctor within the next 5-7 days.  If you do not have a primary care provider, information for a healthcare clinic has been provided for you to make arrangements for follow up care. Please return to the ER sooner if you have any new or worsening symptoms, or if you have any of the following symptoms:  Abdominal pain that does not go away.  You have a fever.  You keep throwing up (vomiting).  The pain is felt only in portions of the abdomen. Pain in the right side could possibly be appendicitis. In an adult, pain in the left lower portion of the abdomen could be colitis or diverticulitis.  You pass bloody or black tarry stools.  There is bright red blood in the stool.  The constipation stays for more than 4 days.  There is belly (abdominal) or rectal pain.  You do not seem to be getting better.  You have any questions or concerns.

## 2018-05-14 NOTE — ED Provider Notes (Addendum)
[facility]-EMERGENCY DEPT Provider Note   CSN: [id number] Arrival date & time: 05/14/18  1602     History   Chief Complaint Chief Complaint  Patient presents with  . Abdominal Pain  . Vaginal Bleeding    HPI Lisa Shaw is a 27 y.o. female.  HPI  Pt is a 27 y/o female with a h/o depression, epilepsy, migraine, who presents to the ED today c/o periumbilical abd pain that began when she woke this morning. States pain is constant and has worsened since onset. Pain described as cramping/stabbing. Rates pain 10/10. States pain radiates to the center of her back. She reports vaginal bleeding that began today as well. States she has used 6 pads today which is normal for her during her menses. Has h/o cramping with her menses. States that sxs feel somewhat similar to menstrual cramps, but pain is slightly worse today and is now associated with NV which is abnormal for her. She also reports nausea and vomiting (x5-6). States vomit is non-bloody and non-bilious. Tried taking tylenol but vomited. Reports urinary frequency and abnormal vaginal discharge. Denies dysuria, urgency, hematuria. No diarrhea, constipation.  States she has been sexually active in the past and has unprotected intercourse, but states she has been tested since then and is not concerned for STDs today.  LMP 04/15/18.  Past Medical History:  Diagnosis Date  . Depression 04/29/2017  . Epilepsy (HCC)   . Migraine   . Seizures (HCC) 07/18/2016  . Sleep disturbance 04/29/2017    Patient Active Problem List   Diagnosis Date Noted  . MDD (major depressive disorder), recurrent severe, without psychosis (HCC) 02/12/2018  . Depression 04/29/2017  . Sleep disturbance 04/29/2017  . Abdominal pain 10/29/2016  . Seizures (HCC) 10/25/2016    History reviewed. No pertinent surgical history.   OB History   None      Home Medications    Prior to Admission medications   Medication Sig Start  Date End Date Taking? Authorizing Provider  citalopram (CELEXA) 20 MG tablet Take 10 mg by mouth daily. 02/11/17   [provider]  dicyclomine (BENTYL) 20 MG tablet Take 1 tablet (20 mg total) by mouth 2 (two) times daily for 5 days. 05/14/18 05/19/18  Jesenia Spera S, PA-C  FLUoxetine (PROZAC) 20 MG capsule Take 1 capsule (20 mg total) by mouth daily. 02/16/18   Oneta Rack, NP  hydrOXYzine (ATARAX/VISTARIL) 25 MG tablet Take 1 tablet (25 mg total) by mouth 3 (three) times daily as needed for anxiety. 02/15/18   Oneta Rack, NP  nicotine polacrilex (NICORETTE) 2 MG gum Take 1 each (2 mg total) by mouth as needed for smoking cessation. 02/15/18   Oneta Rack, NP  ondansetron (ZOFRAN) 4 MG tablet Take 1 tablet (4 mg total) by mouth every 8 (eight) hours as needed for nausea or vomiting. 05/14/18   Janielle Mittelstadt S, PA-C  Oxcarbazepine (TRILEPTAL) 300 MG tablet Take 1 tablet (300 mg total) by mouth 2 (two) times daily. 02/15/18   Oneta Rack, NP  Oxcarbazepine (TRILEPTAL) 300 MG tablet Take 1 tablet (300 mg total) by mouth 2 (two) times daily. 03/07/18   Donnetta Hutching, MD  potassium chloride (K-DUR,KLOR-CON) 10 MEQ tablet Take 1 tablet (10 mEq total) by mouth daily. 02/16/18   Oneta Rack, NP  topiramate (TOPAMAX) 100 MG tablet Take 3 tablets (300 mg total) by mouth at bedtime. 12/23/17   Van Clines, MD  topiramate (TOPAMAX) 100 MG  tablet Take 1 tablet (100 mg total) by mouth daily. 3 tablets daily at bedtime. 03/07/18   Donnetta Hutching, MD  traZODone (DESYREL) 100 MG tablet Take 1 tablet (100 mg total) by mouth at bedtime as needed for sleep. 02/15/18   Oneta Rack, NP    Family History Family History  Problem Relation Age of Onset  . Seizures Other     Social History Social History   Tobacco Use  . Smoking status: Current Every Day Smoker    Types: Cigarettes  . Smokeless tobacco: Never Used  . Tobacco comment: 1/30 avg 2/day  Substance Use Topics  . Alcohol use: No    . Drug use: Yes    Types: Marijuana    Comment: 1/30 avg 1-2 x daily     Allergies   Keppra [levetiracetam]   Review of Systems Review of Systems  Constitutional: Negative for chills and fever.  HENT: Negative for congestion.   Eyes: Negative for visual disturbance.  Respiratory: Negative for cough and shortness of breath.   Gastrointestinal: Positive for abdominal pain, nausea and vomiting. Negative for constipation and diarrhea.  Genitourinary: Positive for flank pain and frequency. Negative for dysuria, hematuria and urgency.  Musculoskeletal: Negative for back pain.  Skin: Negative for rash.  Neurological: Negative for headaches.     Physical Exam Updated Vital Signs BP 115/71   Pulse 65   Temp 98.2 F (36.8 C) (Oral)   Resp 16   LMP 05/14/2018   SpO2 100%   Physical Exam  Constitutional: She appears well-developed and well-nourished. She appears distressed.  HENT:  Head: Normocephalic and atraumatic.  Mouth/Throat: Oropharynx is clear and moist.  Eyes: Conjunctivae are normal.  Neck: Neck supple.  Cardiovascular: Normal rate, regular rhythm, normal heart sounds and intact distal pulses.  No murmur heard. Pulmonary/Chest: Effort normal and breath sounds normal. No stridor. No respiratory distress. She has no wheezes. She has no rales.  Abdominal: Soft.  Normal BS. Generalized TTP, worse to epigastric area. No rigidity. Mild bilat CVA TTP.  Genitourinary:  Genitourinary Comments: Exam performed by Karrie Meres,  exam chaperoned Date: 05/14/2018 Pelvic exam: normal external genitalia without evidence of trauma. VULVA: normal appearing vulva with no masses, tenderness or lesion. VAGINA: normal appearing vagina with normal color and discharge, no lesions. Blood noted in vaginal vault CERVIX: Nabothian cyst noted on cervix, otherwise normal, no lesions, cervical motion tenderness absent, cervical os closed with out purulent discharge; vaginal discharge, Wet  prep and DNA probe for chlamydia and GC obtained.   ADNEXA: normal adnexa in size, nontender and no masses UTERUS: uterus is normal size, shape, consistency and nontender.   Musculoskeletal: She exhibits no edema.  Neurological: She is alert.  Skin: Skin is warm and dry. Capillary refill takes less than 2 seconds.  Psychiatric: She has a normal mood and affect.  Nursing note and vitals reviewed.  ED Treatments / Results  Labs (all labs ordered are listed, but only abnormal results are displayed) Labs Reviewed  WET PREP, GENITAL - Abnormal; Notable for the following components:      Result Value   WBC, Wet Prep HPF POC FEW (*)    All other components within normal limits  COMPREHENSIVE METABOLIC PANEL - Abnormal; Notable for the following components:   Chloride 112 (*)    CO2 18 (*)    Glucose, Bld 102 (*)    All other components within normal limits  URINALYSIS, ROUTINE W REFLEX MICROSCOPIC - Abnormal; Notable for  the following components:   Specific Gravity, Urine >1.046 (*)    Hgb urine dipstick MODERATE (*)    Ketones, ur 20 (*)    Protein, ur 30 (*)    All other components within normal limits  CBC - Abnormal; Notable for the following components:   MCV 102.5 (*)    All other components within normal limits  LIPASE, BLOOD  I-STAT BETA HCG BLOOD, ED ([facility], [facility], [facility] ONLY)  GC/CHLAMYDIA PROBE AMP ([place]) NOT AT [facility]    EKG None  Radiology Ct Abdomen Pelvis W Contrast  Result Date: 05/14/2018 CLINICAL DATA:  Epigastric pain with nausea and vomiting beginning this morning. EXAM: CT ABDOMEN AND PELVIS WITH CONTRAST TECHNIQUE: Multidetector CT imaging of the abdomen and pelvis was performed using the standard protocol following bolus administration of intravenous contrast. CONTRAST:  ISOVUE-300 IOPAMIDOL (ISOVUE-300) INJECTION 61% COMPARISON:  10/21/2016 FINDINGS: Lower chest: Lung bases are normal. Hepatobiliary: Liver, gallbladder and biliary tree are normal.  Pancreas: Normal. Spleen: Normal. Adrenals/Urinary Tract: Adrenal glands are normal. Kidneys normal size without hydronephrosis or nephrolithiasis. Ureters and bladder are normal. Stomach/Bowel: Stomach and small bowel are normal. Appendix is normal. Colon is normal. Vascular/Lymphatic: Normal. Reproductive: Normal. Other: No free fluid or focal inflammatory change. No free peritoneal air. Musculoskeletal: Normal. IMPRESSION: No acute findings in the abdomen/pelvis. Electronically Signed   By: Elberta Fortis M.D.   On: 05/14/2018 21:23    Procedures Procedures (including critical care time)  Medications Ordered in ED Medications  sodium chloride 0.9 % injection (has no administration in time range)  sodium chloride 0.9 % bolus 1,000 mL (1,000 mLs Intravenous New Bag/Given 05/14/18 2020)  ondansetron (ZOFRAN) injection 4 mg (4 mg Intravenous Given 05/14/18 2020)  dicyclomine (BENTYL) capsule 10 mg (10 mg Oral Given 05/14/18 2116)  ketorolac (TORADOL) 15 MG/ML injection 15 mg (15 mg Intravenous Given 05/14/18 2024)  iopamidol (ISOVUE-300) 61 % injection 100 mL (100 mLs Intravenous Contrast Given 05/14/18 2059)     Initial Impression / Assessment and Plan / ED Course  I have reviewed the triage vital signs and the nursing notes.  Pertinent labs & imaging results that were available during my care of the patient were reviewed by me and considered in my medical decision making (see chart for details).   7:34 PM re-evaluated pt. Nursing unable to get a line. IV team consulted.   Final Clinical Impressions(s) / ED Diagnoses   Final diagnoses:  Abdominal pain, unspecified abdominal location  Vaginal bleeding   Pt with periumbilical abd pain, NV, and vaginal bleeding.  CBC without leukocytosis. No anemia.  CMP with low Co2 at 18. No elevated anion gap. Normal kidney and liver function Lipase WNL.  UA without leukocytosis or anemia. Hematuria, ketonuria, and proteinuria present.   Pelvic  exam without CMT or discharge. No uterine or adnexal TTP to suggest acute intrapelvic pathology such as TOA, ovarian torsion, or PID. Gc/chlmaydia obtained. Wet prep without evidence of trichomonas or BV.  IVF, antiemetics and bentyl given in ED. After re-evaluation, sxs much improved. Still has some mild TTP, no rebound or guarding. nonsurgical abdomen. Has tolerated PO. CT of abd/pelvis negative for acute pathology. Given workup and imaging have been negative, suspect sxs secondary to menstrual cramps versus viral gastroenteritis given her NV. Advised motrin and will give rx for zofran. Advised po hydration and pcp f/u next week. Return if worse. She voices understanding of plan and reasons to return. All questions answered.  Upon discharge patient also  mentions that she is having pain to her tooth in her right upper mouth for the last several months.  Is requesting an antibiotic for this.  States she cannot afford to go to the dentist currently.  On exam tooth #4 is fractured, no periapical abscess. No concern for deep space infection. augmentin and dental referral given.   ED Discharge Orders         Ordered    ondansetron (ZOFRAN) 4 MG tablet  Every 8 hours PRN     05/14/18 2225    dicyclomine (BENTYL) 20 MG tablet  2 times daily     05/14/18 2225           Karrie Meres, PA-C 05/14/18 2236    Javelle Donigan S, PA-C 05/14/18 2249    Virgina Norfolk, DO 05/15/18 0121

## 2018-05-14 NOTE — ED Notes (Signed)
x2 unsuccessful IV attempts by this RN 

## 2018-05-14 NOTE — ED Triage Notes (Signed)
Per EMS- Patient c/o mid abdominal pain and nausea x 7-8 hours. Patient denies vomiting. Patient also c/o vaginal bleeding.

## 2018-05-15 LAB — GC/CHLAMYDIA PROBE AMP (~~LOC~~) NOT AT ARMC
Chlamydia: NEGATIVE
NEISSERIA GONORRHEA: NEGATIVE

## 2018-05-15 MED FILL — ONDANSETRON HCL 4 MG TABLET: 4 | 1 days supply | Qty: 5 | Fill #0

## 2018-05-15 MED FILL — DICYCLOMINE 10 MG CAPSULE: 10 | 5 days supply | Qty: 10 | Fill #0

## 2018-05-15 MED FILL — AMOX-CLAV 875-125 MG TABLET: 875-125 | 7 days supply | Qty: 14 | Fill #0

## 2018-06-02 ENCOUNTER — Ambulatory Visit (INDEPENDENT_AMBULATORY_CARE_PROVIDER_SITE_OTHER): Payer: No Typology Code available for payment source | Admitting: Neurology

## 2018-06-02 ENCOUNTER — Other Ambulatory Visit: Payer: Self-pay

## 2018-06-02 ENCOUNTER — Encounter: Payer: Self-pay | Admitting: Neurology

## 2018-06-02 VITALS — BP 110/72 | HR 100 | Ht 67.0 in | Wt 141.0 lb

## 2018-06-02 DIAGNOSIS — G40009 Localization-related (focal) (partial) idiopathic epilepsy and epileptic syndromes with seizures of localized onset, not intractable, without status epilepticus: Secondary | ICD-10-CM

## 2018-06-02 DIAGNOSIS — G43009 Migraine without aura, not intractable, without status migrainosus: Secondary | ICD-10-CM

## 2018-06-02 DIAGNOSIS — F321 Major depressive disorder, single episode, moderate: Secondary | ICD-10-CM

## 2018-06-02 MED ORDER — TOPIRAMATE 100 MG PO TABS: 300.0000 mg | ORAL_TABLET | Freq: Every day | ORAL | 11 refills | Status: DC

## 2018-06-02 MED ORDER — OXCARBAZEPINE 600 MG PO TABS: ORAL_TABLET | ORAL | 11 refills | Status: DC

## 2018-06-02 NOTE — Progress Notes (Signed)
NEUROLOGY FOLLOW UP OFFICE NOTE  HAYDN CUSH [id number] 1990-10-20  HISTORY OF PRESENT ILLNESS: I had the pleasure of seeing Marijah Larranaga in follow-up in the neurology clinic on 06/02/2018. She is alone in the office today. The patient was last seen 6 months ago for seizures. She is a poor historian and is significantly depressed with flat affect again today. She states she is "overly depressed," denies any suicidal ideation. On her last visit, we discussed adding on Oxcarbazepine to Topiramate. She is taking Topiramate 300mg  qhs for seizures and migraines, and was taking oxcarbazepine 600mg  BID up until 1 month ago when she ran out of refills. She denies any side effects. She thinks she was still having seizures on the medications, her mother would tell her she is staring off. She is mostly alone at home while her mother works. She is tired all the time and gets "iffy" sleep. She continues to have headaches on a daily basis and continues to take daily BC powders. She denies any falls.  History on Initial Assessment 12/02/2017: This is a pleasant 27 year old right-handed woman with a history of seizures since 2013. Records from [facility] and [facility] were reviewed and will be summarized here, she is depressed in the office today and is a poor historian. She initially had nocturnal seizures with described diffuse rhythmic jerking with eye rolling backwards. She had an EEG in 06/2012 reporting left frontal sharps. There is also note of episodes in 2012 where she would have staring spells lasting around 2 minutes with associated diaphoresis and post-ictal confusion with violence usually at night or early in the morning. Both seizure types were associated with tongue bite and urinary incontinence. There is report of deja vu, and she reports tingling in both hands during the "stare offs." She lives with her mother but is mostly by herself at home during the day, and feels she has the "stare off"  episodes around twice a week. She was initially started on Keppra which caused mood issues, then switched to Topamax. It appears she initially had mental slowing on Topamax, but has been taking 300mg  qhs since 2015. She had seen Dr. Marjory Lies at [facility] last year and was started on Vimpat which she stopped again due to side effects. She reports the last nocturnal convulsion was in June 2018 when she woke up feeling weird with her tongue sore and with a headache. She was given a headache cocktail but was also noted to be very anxious and depressed. She was admitted for involuntary commitment at [facility] in July 2018 for suicidal ideation. She currently denies any suicidal ideation but continues to report significant depression. She reports body twitches in her arms or legs. She has headaches on a daily basis, taking an average of 2 Tylenol or BC powder daily. She feels it is due to "overthinking or depression." She has sleep difficulties with interrupted 5-6 hours of sleep at night. She feels fatigued and drowsy during the day. She has dizziness when standing. Vision is occasionally blurred when watching TV. She has noticed swallowing difficulties. She notices her right foot gets numb frequently and her balance is off. No falls. 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   Epilepsy Risk Factors:  Her maternal great grandmother and great aunt had seizures. Otherwise she had a normal birth and early development.  There is no history of febrile convulsions, CNS infections such as meningitis/encephalitis,  significant traumatic brain injury, neurosurgical procedures.  Prior AEDs: Keppra, Vimpat Laboratory Data:  EEGs: EEG in 06/2012 reported as abnormal secondary to focal sharp wave activity in the left frontal region MRI: I personally reviewed MRI brain without contrast done 06/2012 which was degraded by motion. Lateral ventricles are mildly  prominent in size with a slightly rounded appearance, no transependymal CSF flow. Hippocampi appear symmetric with no abnormal signal seen.  PAST MEDICAL HISTORY: Past Medical History:  Diagnosis Date  . Depression 04/29/2017  . Epilepsy (HCC)   . Migraine   . Seizures (HCC) 07/18/2016  . Sleep disturbance 04/29/2017    MEDICATIONS: Current Outpatient Medications on File Prior to Visit  Medication Sig Dispense Refill  . amoxicillin-clavulanate (AUGMENTIN) 875-125 MG tablet Take 1 tablet by mouth every 12 (twelve) hours. 14 tablet 0  . citalopram (CELEXA) 20 MG tablet Take 10 mg by mouth daily.    Marland Kitchen dicyclomine (BENTYL) 20 MG tablet Take 1 tablet (20 mg total) by mouth 2 (two) times daily for 5 days. 10 tablet 0  . FLUoxetine (PROZAC) 20 MG capsule Take 1 capsule (20 mg total) by mouth daily. 30 capsule 0  . hydrOXYzine (ATARAX/VISTARIL) 25 MG tablet Take 1 tablet (25 mg total) by mouth 3 (three) times daily as needed for anxiety. 30 tablet 0  . nicotine polacrilex (NICORETTE) 2 MG gum Take 1 each (2 mg total) by mouth as needed for smoking cessation. 100 tablet 0  . ondansetron (ZOFRAN) 4 MG tablet Take 1 tablet (4 mg total) by mouth every 8 (eight) hours as needed for nausea or vomiting. 5 tablet 0  . Oxcarbazepine (TRILEPTAL) 300 MG tablet Take 1 tablet (300 mg total) by mouth 2 (two) times daily. 60 tablet 0  . Oxcarbazepine (TRILEPTAL) 300 MG tablet Take 1 tablet (300 mg total) by mouth 2 (two) times daily. 60 tablet 1  . potassium chloride (K-DUR,KLOR-CON) 10 MEQ tablet Take 1 tablet (10 mEq total) by mouth daily. 10 tablet 0  . topiramate (TOPAMAX) 100 MG tablet Take 3 tablets (300 mg total) by mouth at bedtime. 90 tablet 6  . topiramate (TOPAMAX) 100 MG tablet Take 1 tablet (100 mg total) by mouth daily. 3 tablets daily at bedtime. 90 tablet 1  . traZODone (DESYREL) 100 MG tablet Take 1 tablet (100 mg total) by mouth at bedtime as needed for sleep. 30 tablet 0   No current  facility-administered medications on file prior to visit.     ALLERGIES: Allergies  Allergen Reactions  . Keppra [Levetiracetam] Other (See Comments)    Reaction:  Headaches     FAMILY HISTORY: Family History  Problem Relation Age of Onset  . Seizures Other     SOCIAL HISTORY: Social History   Socioeconomic History  . Marital status: Single    Spouse name: Not on file  . Number of children: 0  . Years of education: 12th  . Highest education level: Not on file  Occupational History    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[employer]  Social Needs  . Financial resource strain: Not on file  . Food insecurity:    Worry: Not on file    Inability: Not on file  . Transportation needs:    Medical: Not on file    Non-medical: Not on file  Tobacco Use  . Smoking status: Current Every Day Smoker    Types: Cigarettes  . Smokeless tobacco: Never Used  . Tobacco comment: 1/30 avg 2/day  Substance and Sexual Activity  .  Alcohol use: No  . Drug use: Yes    Types: Marijuana    Comment: 1/30 avg 1-2 x daily  . Sexual activity: Yes  Lifestyle  . Physical activity:    Days per week: Not on file    Minutes per session: Not on file  . Stress: Not on file  Relationships  . Social connections:    Talks on phone: Not on file    Gets together: Not on file    Attends religious service: Not on file    Active member of club or organization: Not on file    Attends meetings of clubs or organizations: Not on file    Relationship status: Not on file  . Intimate partner violence:    Fear of current or ex partner: Not on file    Emotionally abused: Not on file    Physically abused: Not on file    Forced sexual activity: Not on file  Other Topics Concern  . Not on file  Social History Narrative   Patient lives in 2 story home with her mother.   High school education    REVIEW OF SYSTEMS: Constitutional: No fevers, chills, or sweats, no generalized fatigue, change in appetite Eyes: No visual  changes, double vision, eye pain Ear, nose and throat: No hearing loss, ear pain, nasal congestion, sore throat Cardiovascular: No chest pain, palpitations Respiratory:  No shortness of breath at rest or with exertion, wheezes GastrointestinaI: No nausea, vomiting, diarrhea, abdominal pain, fecal incontinence Genitourinary:  No dysuria, urinary retention or frequency Musculoskeletal:  + neck pain, back pain Integumentary: No rash, pruritus, skin lesions Neurological: as above Psychiatric: + depression, insomnia, anxiety Endocrine: No palpitations, fatigue, diaphoresis, mood swings, change in appetite, change in weight, increased thirst Hematologic/Lymphatic:  No anemia, purpura, petechiae. Allergic/Immunologic: no itchy/runny eyes, nasal congestion, recent allergic reactions, rashes  PHYSICAL EXAM: Vitals:   06/02/18 1411  BP: 110/72  Pulse: 100  SpO2: 100%   General: No acute distress, flat affect, poor eye contact Head:  Normocephalic/atraumatic Neck: supple, no paraspinal tenderness, full range of motion Heart:  Regular rate and rhythm Lungs:  Clear to auscultation bilaterally Back: No paraspinal tenderness Skin/Extremities: No rash, no edema Neurological Exam: alert and oriented to person, place, and time. No aphasia or dysarthria. Fund of knowledge is appropriate.  Recent and remote memory are intact.  Attention and concentration are normal.    Able to name objects and repeat phrases. Cranial nerves: Pupils equal, round, reactive to light.  Extraocular movements intact with no nystagmus. Visual fields full. Facial sensation intact. No facial asymmetry. Tongue, uvula, palate midline.  Motor: Bulk and tone normal, muscle strength 5/5 throughout with no pronator drift.  Sensation to light touch intact.  No extinction to double simultaneous stimulation. Finger to nose testing intact.  Gait narrow-based and steady, able to tandem walk adequately.  Romberg negative.  IMPRESSION: This is  a pleasant 27 yo RH woman with a history of depression, migraines, and seizures. 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 Prior EEG in 2013 reported left frontal sharp waves, MRI degraded by motion but no acute changes seen. She denies any nocturnal convulsions since Jun 2013 but continued to report staring spells around twice a week. She is a poor historian and continues to report seizures on oxcarbazepine and topiramate. Increase oxcarbazepine to 900mg  BID, continue Topiramate 300mg  qhs. A 48-hour EEG will be done to further classify her seizures. She continues  to have daily headaches but takes BC powder on a daily basis, likely medication overuse headaches, we again discussed minimizing over the counter pain medication to 2-3 times a week. She is also significantly depressed, which we discussed can cause a lot of somatic symptoms. [facility] was provided today. She was also provided with information for the [facility] [place] to help with medication assistance and questions regarding disability (she states she was denied Medicaid). She does not drive. She will follow-up in 4 months and knows to call for any changes.  Thank you for allowing me to participate in her care.  Please do not hesitate to call for any questions or concerns.  The duration of this appointment visit was 30 minutes of face-to-face time with the patient.  Greater than 50% of this time was spent in counseling, explanation of diagnosis, planning of further management, and coordination of care.   Patrcia DollyKaren Nahsir Venezia, M.D.   CC: Dr. Delrae AlfredMulberry
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
## 2018-06-03 MED FILL — OXcarbazepine 600 MG TABS: 600 | 30 days supply | Qty: 90 | Fill #0

## 2018-06-09 MED FILL — TOPIRAMATE 100 MG TABS: 100 | 30 days supply | Qty: 90 | Fill #5

## 2018-06-15 ENCOUNTER — Telehealth: Payer: Self-pay | Admitting: Neurology

## 2018-06-15 ENCOUNTER — Ambulatory Visit (INDEPENDENT_AMBULATORY_CARE_PROVIDER_SITE_OTHER): Payer: No Typology Code available for payment source | Admitting: Neurology

## 2018-06-15 DIAGNOSIS — G43009 Migraine without aura, not intractable, without status migrainosus: Secondary | ICD-10-CM

## 2018-06-15 DIAGNOSIS — G40009 Localization-related (focal) (partial) idiopathic epilepsy and epileptic syndromes with seizures of localized onset, not intractable, without status epilepticus: Secondary | ICD-10-CM

## 2018-06-15 DIAGNOSIS — F321 Major depressive disorder, single episode, moderate: Secondary | ICD-10-CM

## 2018-06-15 NOTE — Telephone Encounter (Signed)
Jerrel IvoryGabrielle from [employer] is calling on this patient for a service request. She left a vm to call her back at [phone] ext [phone]. Thanks!

## 2018-06-15 NOTE — Telephone Encounter (Signed)
Returned call to Mus[place]rielle.  She is with [employer] [facility].  Pt had submitted request with them.  Lisa Shaw guided me through their website, to assist with pt's request.  This is assistance for oncology only medications.  Pt does not have cancer, and is not taking any oncology medications.  Lisa Shaw states that she will close pt's case as they do not supply nor offer help for medications that pt does take.

## 2018-07-06 ENCOUNTER — Telehealth: Payer: Self-pay

## 2018-07-06 NOTE — Telephone Encounter (Signed)
Spoke with pt relaying message below.  Pt verbalized understanding.  

## 2018-07-06 NOTE — Telephone Encounter (Signed)
-----   Message from Van ClinesKaren M Aquino, MD sent at 07/06/2018  8:11 AM EST ----- Regarding: EEG results Pls let her know the EEG was normal, continue all meds, no changes. Thanks

## 2018-07-07 ENCOUNTER — Emergency Department (HOSPITAL_COMMUNITY)
Admission: EM | Admit: 2018-07-07 | Discharge: 2018-07-07 | Disposition: A | Discharge status: Home / Self Care | Payer: Medicaid Other | Attending: Emergency Medicine | Admitting: Emergency Medicine

## 2018-07-07 ENCOUNTER — Encounter (HOSPITAL_COMMUNITY): Payer: Self-pay | Admitting: Emergency Medicine

## 2018-07-07 ENCOUNTER — Other Ambulatory Visit: Payer: Self-pay

## 2018-07-07 ENCOUNTER — Emergency Department (HOSPITAL_COMMUNITY): Payer: Medicaid Other

## 2018-07-07 DIAGNOSIS — R079 Chest pain, unspecified: Secondary | ICD-10-CM | POA: Diagnosis present

## 2018-07-07 DIAGNOSIS — F17210 Nicotine dependence, cigarettes, uncomplicated: Secondary | ICD-10-CM | POA: Diagnosis not present

## 2018-07-07 DIAGNOSIS — Z79899 Other long term (current) drug therapy: Secondary | ICD-10-CM | POA: Insufficient documentation

## 2018-07-07 DIAGNOSIS — R058 Other specified cough: Secondary | ICD-10-CM

## 2018-07-07 DIAGNOSIS — R05 Cough: Secondary | ICD-10-CM

## 2018-07-07 DIAGNOSIS — F419 Anxiety disorder, unspecified: Secondary | ICD-10-CM | POA: Diagnosis not present

## 2018-07-07 DIAGNOSIS — R072 Precordial pain: Secondary | ICD-10-CM | POA: Insufficient documentation

## 2018-07-07 DIAGNOSIS — R0789 Other chest pain: Secondary | ICD-10-CM

## 2018-07-07 LAB — I-STAT TROPONIN, ED: Troponin i, poc: 0.01 ng/mL (ref 0.00–0.08)

## 2018-07-07 MED ORDER — ACETAMINOPHEN 500 MG PO TABS
1000.0000 mg | ORAL_TABLET | Freq: Once | ORAL | Status: AC
  Administered 2018-07-07: 1000 mg via ORAL
  Filled 2018-07-07: qty 2
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
## 2018-07-07 NOTE — ED Notes (Signed)
Patient verbalizes understanding of discharge instructions. Opportunity for questioning and answers were provided. Armband removed by staff, pt discharged from ED in wheelchair.  

## 2018-07-07 NOTE — ED Notes (Signed)
Patient transported to X-ray 

## 2018-07-07 NOTE — Discharge Instructions (Signed)
It was our pleasure to provide your ER care today - we hope that you feel better.  Take acetaminophen and/or ibuprofen as need.   Follow up with primary care doctor in the coming week.  Return to ER if worse, new symptoms, trouble breathing, high fevers, new or severe pain, other concern.

## 2018-07-13 NOTE — Procedures (Signed)
ELECTROENCEPHALOGRAM REPORT  Dates of Recording: 06/15/2018 8:05AM to 06/17/2018 8:07AM  Patient's Name: Lisa Shaw MRN: [id number] Date of Birth: May 19, 1991  Referring Provider: Dr. Patrcia DollyKaren Aquino  Procedure: 48-hour ambulatory EEG  History: This is a 27 year old woman with recurrent seizures/staring spells on 2 AEDs. EEG for classification  Medications:  TRILEPTAL 300 MG tablet  TOPAMAX 100 MG tablet  AUGMENTIN 875-125 MG tablet  CELEXA 20 MG tablet BENTYL 20 MG tablet  PROZAC 20 MG capsule  ATARAX/VISTARIL 25 MG tablet  NICORETTE 2 MG gum  ZOFRAN 4 MG tablet  K-DUR,KLOR-CON 10 MEQ tablet  DESYREL 100 MG tablet   Technical Summary: This is a 48-hour multichannel digital EEG recording measured by the international 10-20 system with electrodes applied with paste and impedances below 5000 ohms performed as portable with EKG monitoring.  The digital EEG was referentially recorded, reformatted, and digitally filtered in a variety of bipolar and referential montages for optimal display.    DESCRIPTION OF RECORDING: During maximal wakefulness, the background activity consisted of a symmetric 10 Hz posterior dominant rhythm which was reactive to eye opening.  There were no epileptiform discharges or focal slowing seen in wakefulness.  During the recording, the patient progresses through wakefulness, drowsiness, and Stage 2 sleep.  Again, there were no epileptiform discharges seen.  Events: On 12/2 around 1200 hours she had headache and nausea. Electrographically, there were no EEG or EKG changes seen.  There were no electrographic seizures seen.  EKG lead was unremarkable.  IMPRESSION: This 48-hour ambulatory EEG study is normal.    CLINICAL CORRELATION: A normal EEG does not exclude a clinical diagnosis of epilepsy. Typical events were not captured. If further clinical questions remain, inpatient video EEG monitoring may be helpful.   Patrcia DollyKaren Aquino, M.D.

## 2018-07-20 ENCOUNTER — Other Ambulatory Visit: Payer: Self-pay | Admitting: Neurology

## 2018-07-20 MED FILL — TOPIRAMATE 100 MG TABS: 100 | 30 days supply | Qty: 90 | Fill #0

## 2018-08-21 ENCOUNTER — Encounter

## 2018-08-21 ENCOUNTER — Encounter: Payer: Self-pay | Admitting: Neurology

## 2018-08-21 ENCOUNTER — Ambulatory Visit (INDEPENDENT_AMBULATORY_CARE_PROVIDER_SITE_OTHER): Payer: No Typology Code available for payment source | Admitting: Neurology

## 2018-08-21 VITALS — BP 108/66 | HR 98 | Ht 67.0 in | Wt 146.0 lb

## 2018-08-21 DIAGNOSIS — G40009 Localization-related (focal) (partial) idiopathic epilepsy and epileptic syndromes with seizures of localized onset, not intractable, without status epilepticus: Secondary | ICD-10-CM

## 2018-08-21 DIAGNOSIS — F321 Major depressive disorder, single episode, moderate: Secondary | ICD-10-CM

## 2018-08-21 MED ORDER — CITALOPRAM HYDROBROMIDE 20 MG PO TABS: 20.0000 mg | ORAL_TABLET | Freq: Every day | ORAL | 3 refills | Status: DC

## 2018-08-21 MED ORDER — TOPIRAMATE 100 MG PO TABS: 300.0000 mg | ORAL_TABLET | Freq: Every day | ORAL | 11 refills | Status: DC

## 2018-08-21 MED ORDER — OXCARBAZEPINE 600 MG PO TABS: ORAL_TABLET | ORAL | 11 refills | Status: DC

## 2018-08-21 MED FILL — TOPIRAMATE 100 MG TABS: 100 | 30 days supply | Qty: 90 | Fill #0

## 2018-08-21 MED FILL — OXcarbazepine 600 MG TABS: 600 | 30 days supply | Qty: 90 | Fill #0

## 2018-08-21 MED FILL — CITALOPRAM HBR 20 MG TABLET: 20 | 30 days supply | Qty: 30 | Fill #0
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
## 2018-08-21 NOTE — Progress Notes (Signed)
NEUROLOGY FOLLOW UP OFFICE NOTE  Lisa Shaw [id number] 01/31/1991  HISTORY OF PRESENT ILLNESS: I had the pleasure of seeing Lisa Shaw in follow-up in the neurology clinic on 08/21/2018. She is alone in the office today. The patient was last seen 3 months ago for seizures and headaches. She is a poor historian and is again noted to be significantly depressed with flat affect. She has not seen [facility]. She continues to report depression, reporting a recent breakup and losing her job. She denies any suicidal ideation. She was reporting continued episodes of staring off on oxcarbazepine 900mg  BID and Topiramate 300mg  qhs. A 48-hour EEG done in December 2019 was normal, typical events were not captured. She lives with her grandfather who has not told her of any seizures. She still feels herself staring off a little at times. Last nocturnal convulsion was in June 2018. She continues to have daily headaches and feels tired all the time. She continues to take daily BC powders. She reports her left ankle is sore, denies any falls.   History on Initial Assessment 12/02/2017: This is a pleasant 28 year old right-handed woman with a history of seizures since 2013. [facility] and will be summarized here, she is depressed in the office today and is a poor historian. She initially had nocturnal seizures with described diffuse rhythmic jerking with eye rolling backwards. She had an EEG in 06/2012 reporting left frontal sharps. There is also note of episodes in 2012 where she would have staring spells lasting around 2 minutes with associated diaphoresis and post-ictal confusion with violence usually at night or early in the morning. Both seizure types were associated with tongue bite and urinary incontinence. There is report of deja vu, and she reports tingling in both hands during the "stare offs." She lives with her mother but is mostly by herself at home  during the day, and feels she has the "stare off" episodes around twice a week. She was initially started on Keppra which caused mood issues, then switched to Topamax. It appears she initially had mental slowing on Topamax, but has been taking 300mg  qhs since 2015. She had seen Dr. Marjory LiesPenumalli at [facility] last year and was started on Vimpat which she stopped again due to side effects. She reports the last nocturnal convulsion was in June 2018 when she woke up feeling weird with her tongue sore and with a headache. She was given a headache cocktail but was also noted to be very anxious and depressed. [facility] for suicidal ideation. She currently denies any suicidal ideation but continues to report significant depression. She reports body twitches in her arms or legs. She has headaches on a daily basis, taking an average of 2 Tylenol or BC powder daily. She feels it is due to "overthinking or depression." She has sleep difficulties with interrupted 5-6 hours of sleep at night. She feels fatigued and drowsy during the day. She has dizziness when standing. Vision is occasionally blurred when watching TV. She has noticed swallowing difficulties. She notices her right foot gets numb frequently and her balance is off. No falls. 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   Epilepsy Risk Factors:  Her maternal great grandmother and great aunt had seizures. Otherwise she had a normal birth and early development.  There is no history of febrile  convulsions, CNS infections such as meningitis/encephalitis, significant traumatic brain injury, neurosurgical procedures.  Prior AEDs: Keppra, Vimpat Laboratory Data:  EEGs: EEG in 06/2012 reported as abnormal secondary to focal sharp wave activity in the left frontal region MRI: I personally reviewed MRI brain without contrast done 06/2012 which was  degraded by motion. Lateral ventricles are mildly prominent in size with a slightly rounded appearance, no transependymal CSF flow. Hippocampi appear symmetric with no abnormal signal seen.  PAST MEDICAL HISTORY: Past Medical History:  Diagnosis Date  . Depression 04/29/2017  . Epilepsy (HCC)   . Migraine   . Seizures (HCC) 07/18/2016  . Sleep disturbance 04/29/2017    MEDICATIONS: Current Outpatient Medications on File Prior to Visit  Medication Sig Dispense Refill  . citalopram (CELEXA) 20 MG tablet Take 10 mg by mouth daily.    Marland Kitchen FLUoxetine (PROZAC) 20 MG capsule Take 1 capsule (20 mg total) by mouth daily. 30 capsule 0  . hydrOXYzine (ATARAX/VISTARIL) 25 MG tablet Take 1 tablet (25 mg total) by mouth 3 (three) times daily as needed for anxiety. 30 tablet 0  . nicotine polacrilex (NICORETTE) 2 MG gum Take 1 each (2 mg total) by mouth as needed for smoking cessation. 100 tablet 0  . ondansetron (ZOFRAN) 4 MG tablet Take 1 tablet (4 mg total) by mouth every 8 (eight) hours as needed for nausea or vomiting. 5 tablet 0  . oxcarbazepine (TRILEPTAL) 600 MG tablet TAKE 1 & 1/2 TABLETS BY MOUTH TWICE A DAY 90 tablet 11  . potassium chloride (K-DUR,KLOR-CON) 10 MEQ tablet Take 1 tablet (10 mEq total) by mouth daily. 10 tablet 0  . topiramate (TOPAMAX) 100 MG tablet Take 3 tablets (300 mg total) by mouth at bedtime. 90 tablet 11  . traZODone (DESYREL) 100 MG tablet Take 1 tablet (100 mg total) by mouth at bedtime as needed for sleep. 30 tablet 0   No current facility-administered medications on file prior to visit.     ALLERGIES: Allergies  Allergen Reactions  . Keppra [Levetiracetam] Other (See Comments)    Reaction:  Headaches     FAMILY HISTORY: Family History  Problem Relation Age of Onset  . Seizures Other     SOCIAL HISTORY: Social History   Socioeconomic History  . Marital status: Single    Spouse name: Not on file  . Number of children: 0  . Years of education: 12th    . 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 Financial resource strain: Not on file  . Food insecurity:    Worry: Not on file    Inability: Not on file  . Transportation needs:    Medical: Not on file    Non-medical: Not on file  Tobacco Use  . Smoking status: Current Every Day Smoker    Types: Cigarettes  . Smokeless tobacco: Never Used  . Tobacco comment: 1/30 avg 2/day  Substance and Sexual Activity  . Alcohol use: No  . Drug use: Yes    Types: Marijuana    Comment: 1/30 avg 1-2 x daily  . Sexual activity: Yes  Lifestyle  . Physical activity:    Days per week: Not on file    Minutes per session: Not on file  . Stress: Not on file  Relationships  . Social connections:    Talks on phone: Not on file    Gets together: Not on file  Attends religious service: Not on file    Active member of club or organization: Not on file    Attends meetings of clubs or organizations: Not on file    Relationship status: Not on file  . Intimate partner violence:    Fear of current or ex partner: Not on file    Emotionally abused: Not on file    Physically abused: Not on file    Forced sexual activity: Not on file  Other Topics Concern  . Not on file  Social History Narrative   Patient lives in 2 story home with her mother.   High school education    REVIEW OF SYSTEMS: Constitutional: No fevers, chills, or sweats, no generalized fatigue, change in appetite Eyes: No visual changes, double vision, eye pain Ear, nose and throat: No hearing loss, ear pain, nasal congestion, sore throat Cardiovascular: No chest pain, palpitations Respiratory:  No shortness of breath at rest or with exertion, wheezes GastrointestinaI: No nausea, vomiting, diarrhea, abdominal pain, fecal incontinence Genitourinary:  No dysuria, urinary retention or frequency Musculoskeletal:  + neck pain, back pain Integumentary: No rash,  pruritus, skin lesions Neurological: as above Psychiatric: + depression, insomnia, anxiety Endocrine: No palpitations, fatigue, diaphoresis, mood swings, change in appetite, change in weight, increased thirst Hematologic/Lymphatic:  No anemia, purpura, petechiae. Allergic/Immunologic: no itchy/runny eyes, nasal congestion, recent allergic reactions, rashes  PHYSICAL EXAM: Vitals:   08/21/18 0810  BP: 108/66  Pulse: 98  SpO2: 99%   General: No acute distress, flat affect, poor eye contact (similar to prior) Head:  Normocephalic/atraumatic Neck: supple, no paraspinal tenderness, full range of motion Heart:  Regular rate and rhythm Lungs:  Clear to auscultation bilaterally Back: No paraspinal tenderness Skin/Extremities: No rash, no edema Neurological Exam: alert and oriented to person, place, and time. No aphasia or dysarthria. Fund of knowledge is appropriate.  Recent and remote memory are intact.  Attention and concentration are normal.    Able to name objects and repeat phrases. Cranial nerves: Pupils equal, round, reactive to light.  Extraocular movements intact with no nystagmus. Visual fields full. Facial sensation intact. No facial asymmetry. Tongue, uvula, palate midline.  Motor: Bulk and tone normal, muscle strength 5/5 throughout with no pronator drift.  Sensation to light touch intact.  No extinction to double simultaneous stimulation. Finger to nose testing intact.  Gait narrow-based and steady, able to tandem walk adequately.  Romberg negative.  IMPRESSION: This is a pleasant 28 yo RH woman with a history of depression, migraines, and seizures. 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 Prior EEG in 2013 reported left frontal sharp waves, MRI degraded by motion but no acute changes seen. She denies any nocturnal convulsions since June 2018 but continued to report staring spells around twice a week. Her 48-hour EEG  was normal, however typical events were not captured. We discussed different causes of staring off, she is significantly depressed which can also cause similar symptoms. We discussed continuation of current AEDs for now, refills for oxcarbazepine 900mg  BID and Topiramate 300mg  qhs were sent. She was encouraged to see Psychiatry, referral sent. She ran out of citalopram and asks for refills until follow-up with Psych, refills for citalopram 20mg  daily were sent. She does not drive. She will follow-up in 6 months and knows to call for any changes.  Thank you for allowing me to participate in her care.  Please do not hesitate to call for any questions or concerns.  The duration of this appointment visit was 20 minutes of face-to-face time with the patient.  Greater than 50% of this time was spent in counseling, explanation of diagnosis, planning of further management, and coordination of care.   Patrcia Dolly, M.D.   CC: Dr. Delrae Alfred

## 2018-09-05 ENCOUNTER — Encounter (HOSPITAL_COMMUNITY): Payer: Self-pay | Admitting: Psychiatry

## 2018-09-05 ENCOUNTER — Ambulatory Visit (INDEPENDENT_AMBULATORY_CARE_PROVIDER_SITE_OTHER): Payer: Self-pay | Admitting: Psychiatry

## 2018-09-05 VITALS — BP 122/76 | Ht 67.0 in | Wt 143.0 lb

## 2018-09-05 DIAGNOSIS — F1211 Cannabis abuse, in remission: Secondary | ICD-10-CM | POA: Insufficient documentation

## 2018-09-05 DIAGNOSIS — F411 Generalized anxiety disorder: Secondary | ICD-10-CM

## 2018-09-05 DIAGNOSIS — F332 Major depressive disorder, recurrent severe without psychotic features: Secondary | ICD-10-CM

## 2018-09-05 DIAGNOSIS — F1210 Cannabis abuse, uncomplicated: Secondary | ICD-10-CM

## 2018-09-05 MED ORDER — HYDROXYZINE HCL 25 MG PO TABS: 25.0000 mg | ORAL_TABLET | Freq: Three times a day (TID) | ORAL | 0 refills | Status: DC | PRN

## 2018-09-05 MED ORDER — TRAZODONE HCL 100 MG PO TABS: 100.0000 mg | ORAL_TABLET | Freq: Every evening | ORAL | 0 refills | Status: DC | PRN
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
## 2018-09-23 MED FILL — TOPIRAMATE 100 MG TABS: 100 | 30 days supply | Qty: 90 | Fill #1

## 2018-09-28 ENCOUNTER — Ambulatory Visit (HOSPITAL_COMMUNITY): Payer: Medicaid Other | Admitting: Psychiatry

## 2018-09-29 ENCOUNTER — Ambulatory Visit: Payer: No Typology Code available for payment source | Admitting: Neurology

## 2018-09-30 ENCOUNTER — Ambulatory Visit (HOSPITAL_COMMUNITY): Payer: Medicaid Other | Admitting: Psychiatry

## 2018-10-12 MED FILL — CITALOPRAM HBR 20 MG TABLET: 20 | 30 days supply | Qty: 30 | Fill #1

## 2018-10-19 ENCOUNTER — Ambulatory Visit (HOSPITAL_COMMUNITY): Payer: Medicaid Other | Admitting: Psychiatry

## 2018-10-19 ENCOUNTER — Ambulatory Visit (INDEPENDENT_AMBULATORY_CARE_PROVIDER_SITE_OTHER): Payer: Medicaid Other | Admitting: Psychiatry

## 2018-10-19 ENCOUNTER — Other Ambulatory Visit: Payer: Self-pay

## 2018-10-19 DIAGNOSIS — F331 Major depressive disorder, recurrent, moderate: Secondary | ICD-10-CM

## 2018-10-19 DIAGNOSIS — F411 Generalized anxiety disorder: Secondary | ICD-10-CM

## 2018-10-19 DIAGNOSIS — F1211 Cannabis abuse, in remission: Secondary | ICD-10-CM

## 2018-10-19 MED ORDER — TRAZODONE HCL 100 MG PO TABS: 100.0000 mg | ORAL_TABLET | Freq: Every evening | ORAL | 2 refills | Status: DC | PRN

## 2018-10-19 MED ORDER — HYDROXYZINE HCL 25 MG PO TABS: 25.0000 mg | ORAL_TABLET | Freq: Three times a day (TID) | ORAL | 2 refills | Status: DC | PRN

## 2018-10-19 MED ORDER — CITALOPRAM HYDROBROMIDE 20 MG PO TABS: 20.0000 mg | ORAL_TABLET | Freq: Every day | ORAL | 2 refills | Status: DC

## 2018-10-19 NOTE — Addendum Note (Signed)
Addended by: Louisa Second on: 10/19/2018 12:11 PM   Modules accepted: Level of Service

## 2018-10-19 NOTE — Progress Notes (Signed)
BH MD/PA/NP OP Progress Note  10/19/2018 12:03 PM Lisa Shaw  MRN:  [id number]  Interview was conducted using teleconferencing and I verified that I was speaking with the correct person using two identifiers. I discussed the limitations of evaluation and management by telemedicine and  the availability of in person appointments. Patient expressed understanding and agreed to proceed.  Chief Complaint: Some anxiety, disturbing dreams since running out of citalopram.  HPI: 28 yo female with seizure disorder and headaches as well as major depressive disorder and generalized anxiety disorder. She has started on citalopram 20 mg 7 weeks ago - good response and tolerability but run out of it few days ago. Sleep adequate with trazodone which she does not use every night. Hydroxyzine prn anxiety also is effective. She managed to refrain from smoking marijuana. She sound brighter, no SI. There is no hx of mania or psychosis. Overall, her mood has improved and she has no desire to change her current medications/doses.  Visit Diagnosis:    ICD-10-CM   1. GAD (generalized anxiety disorder) F41.1   2. Major depressive disorder, recurrent episode, moderate (HCC) F33.1   3. 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 Depression 04/29/2017  . Epilepsy (HCC)   . Migraine   . Seizures (HCC) 07/18/2016  . Sleep disturbance 04/29/2017   No past surgical history on file.  Family Psychiatric History: None known.  Family History:  Family History  Problem Relation Age of Onset  . Seizures Other     Social History:  Social History   Socioeconomic History  . Marital status: Single    Spouse name: Not on file  . Number of children: 0  . Years of education: 12th  . 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 Financial resource strain: Not on file  . Food insecurity:    Worry: Not on file    Inability: Not on file  . Transportation needs:    Medical: Not on file    Non-medical: Not on file  Tobacco Use  . Smoking status: Current Every Day Smoker    Packs/day: 0.50    Types: Cigarettes  . Smokeless tobacco: Never Used  . Tobacco comment: 1/30 avg 2/day  Substance and Sexual Activity  . Alcohol use: No  . Drug use: Yes    Types: Marijuana    Comment: trying to quit  . Sexual activity: Yes  Lifestyle  . Physical activity:    Days per week: Not on file    Minutes per session: Not on file  . Stress: Not on file  Relationships  . Social connections:    Talks on phone: Not on file    Gets together: Not on file    Attends religious service: Not on file    Active member of club or organization: Not on file    Attends meetings of clubs or organizations: Not on file    Relationship status: Not on file  Other Topics Concern  . Not on file  Social History Narrative   Patient lives in 2 story home with her mother.   High school education    Allergies:  Allergies  Allergen Reactions  . Keppra [Levetiracetam] Other (See Comments)    Reaction:  Headaches  Metabolic Disorder Labs: Lab Results  Component Value Date   HGBA1C 4.5 (L) 01/23/2017   No results found for: PROLACTIN No results found for: CHOL, TRIG, HDL, CHOLHDL, VLDL, LDLCALC Lab Results  Component Value Date   TSH 1.180 01/23/2017   TSH 1.51 10/25/2016    Therapeutic Level Labs: No results found for: LITHIUM No results found for: VALPROATE No components found for:  CBMZ  Current Medications: Current Outpatient Medications  Medication Sig Dispense Refill  . citalopram (CELEXA) 20 MG tablet Take 1 tablet (20 mg total) by mouth daily. 30 tablet 2  . hydrOXYzine (ATARAX/VISTARIL) 25 MG tablet Take 1 tablet (25 mg total) by mouth 3 (three) times daily as needed for anxiety. 90 tablet 2  .  nicotine polacrilex (NICORETTE) 2 MG gum Take 1 each (2 mg total) by mouth as needed for smoking cessation. (Patient not taking: Reported on 09/05/2018) 100 tablet 0  . ondansetron (ZOFRAN) 4 MG tablet Take 1 tablet (4 mg total) by mouth every 8 (eight) hours as needed for nausea or vomiting. (Patient not taking: Reported on 09/05/2018) 5 tablet 0  . oxcarbazepine (TRILEPTAL) 600 MG tablet TAKE 1 & 1/2 TABLETS BY MOUTH TWICE A DAY 90 tablet 11  . potassium chloride (K-DUR,KLOR-CON) 10 MEQ tablet Take 1 tablet (10 mEq total) by mouth daily. (Patient not taking: Reported on 09/05/2018) 10 tablet 0  . topiramate (TOPAMAX) 100 MG tablet Take 3 tablets (300 mg total) by mouth at bedtime. 90 tablet 11  . traZODone (DESYREL) 100 MG tablet Take 1 tablet (100 mg total) by mouth at bedtime as needed for sleep. 30 tablet 2   No current facility-administered medications for this visit.      Psychiatric Specialty Exam: Review of Systems  Gastrointestinal: Positive for heartburn.  Neurological: Positive for headaches.  Psychiatric/Behavioral: Positive for depression. The patient is nervous/anxious.   All other systems reviewed and are negative.   There were no vitals taken for this visit.There is no height or weight on file to calculate BMI.  General Appearance: NA  Eye Contact:  NA  Speech:  Clear and Coherent  Volume:  Normal  Mood:  Anxious  Affect:  NA  Thought Process:  Descriptions of Associations: Circumstantial  Orientation:  Full (Time, Place, and Person)  Thought Content: Logical   Suicidal Thoughts:  No  Homicidal Thoughts:  No  Memory:  Immediate;   Fair Recent;   Fair Remote;   Fair  Judgement:  Fair  Insight:  Fair  Psychomotor Activity:  NA  Concentration:  Concentration: Fair  Recall:  Fair  Fund of Knowledge: Fair  Language: Good  Akathisia:  NA  Handed:  Right  AIMS (if indicated): not done  Assets:  Desire for Improvement Housing Resilience Social Support  ADL's:   Intact  Cognition: WNL  Sleep:  Fair   Screenings: PHQ2-9     Office Visit from 04/15/2017 in [facility] Office Visit from 01/28/2017 in [facility] Office Visit from 12/12/2016 in [facility] Office Visit from 12/11/2016 in [facility] Office Visit from 10/30/2016 in [facility]  PHQ-2 Total Score  6  6  0  0  0  PHQ-9 Total Score  19  22  -  -  -       Assessment and Plan: 28 yo female with seizure disorder and headaches as well as major depressive disorder and generalized anxiety disorder.  She has started on citalopram 20 mg 7 weeks ago - good response and tolerability but run out of it few days ago. Sleep adequate with trazodone which she does not use every night. Hydroxyzine prn anxiety also is effective. She managed to refrain from smoking marijuana. She sound brighter, no SI. There is no hx of mania or psychosis. Overall, her mood has improved and she has no desire to change her current medications/doses.  Plan: Continue citalopram, hydroxyzine and trazodone unchanged. Next medication management visit in 4 weeks.    Magdalene Patricia, MD 10/19/2018, 12:03 PM

## 2018-10-21 ENCOUNTER — Telehealth: Payer: Self-pay | Admitting: Neurology

## 2018-10-21 ENCOUNTER — Telehealth (HOSPITAL_COMMUNITY): Payer: Self-pay

## 2018-10-21 MED ORDER — TOPIRAMATE 100 MG PO TABS: 300.0000 mg | ORAL_TABLET | Freq: Every day | ORAL | 11 refills | Status: DC

## 2018-10-21 MED ORDER — OXCARBAZEPINE 600 MG PO TABS: ORAL_TABLET | ORAL | 11 refills | Status: DC

## 2018-10-21 NOTE — Telephone Encounter (Signed)
Trileptal and Topiramate Rx's sent to [facility] on [address].  Pt made aware

## 2018-10-21 NOTE — Telephone Encounter (Signed)
Medication management - Telephone call with patient to inform Dr. Hinton Dyer had sent in her requested refills of Celexa, Vistaril and Trazodone to her [facility] at [facility] on 10/19/18 as requested.

## 2018-10-21 NOTE — Telephone Encounter (Signed)
Patient called and would like to have her medications switched from [facility] to [facility] at [address]. She would like you to please call her to let her know which medications. Please Call. Thanks

## 2018-11-12 NOTE — Telephone Encounter (Signed)
Message sent to provider 

## 2018-11-18 ENCOUNTER — Ambulatory Visit (HOSPITAL_COMMUNITY): Payer: Medicaid Other | Admitting: Psychiatry

## 2018-11-24 ENCOUNTER — Ambulatory Visit (INDEPENDENT_AMBULATORY_CARE_PROVIDER_SITE_OTHER): Payer: Medicare Other | Admitting: Psychiatry

## 2018-11-24 ENCOUNTER — Other Ambulatory Visit: Payer: Self-pay

## 2018-11-24 DIAGNOSIS — F330 Major depressive disorder, recurrent, mild: Secondary | ICD-10-CM

## 2018-11-24 DIAGNOSIS — F411 Generalized anxiety disorder: Secondary | ICD-10-CM

## 2018-11-24 DIAGNOSIS — F1210 Cannabis abuse, uncomplicated: Secondary | ICD-10-CM | POA: Diagnosis not present

## 2018-11-24 NOTE — Progress Notes (Signed)
BH MD/PA/NP OP Progress Note  11/24/2018 8:40 AM Lisa Shaw  MRN:  [id number] Interview was conducted by phone and I verified that I was speaking with the correct person using two identifiers. I discussed the limitations of evaluation and management by telemedicine and  the availability of in person appointments. Patient expressed understanding and agreed to proceed.  Chief Complaint: Anxiety  HPI: 28 yo female with seizure disorder and headaches as well as major depressive disorder and generalized anxiety disorder. She has started on citalopram 20 mg 7 weeks ago - good response and tolerability but run out of it few days ago. Sleep adequate with trazodone which she does not use every night. Hydroxyzine prn anxiety also is effective. She managed to refrain from smoking marijuana. She sounds brighter, no SI. There is no hx of mania or psychosis. Overall, her mood has improved and she has no desire to change her current medications/doses. She is mostly upset about denials of her SSI disability claims - has a Clinical research associate and is still pursuing disability.             Visit Diagnosis:    ICD-10-CM   1. GAD (generalized anxiety disorder) F41.1   2. MDD (major depressive disorder), recurrent episode, mild (HCC) F33.0   3. Cannabis use disorder, mild, abuse F12.10     Past Psychiatric History: Please refer to intake H&P.  Past Medical History:  Past Medical History:  Diagnosis Date  . Depression 04/29/2017  . Epilepsy (HCC)   . Migraine   . Seizures (HCC) 07/18/2016  . Sleep disturbance 04/29/2017   No past surgical history on file.  Family Psychiatric History: None  Family History:  Family History  Problem Relation Age of Onset  . Seizures Other     Social History:  Social History   Socioeconomic History  . Marital status: Single    Spouse name: Not on file  . Number of children: 0  . Years of education: 12th  . 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 Financial resource strain: Not on file  . Food insecurity:    Worry: Not on file    Inability: Not on file  . Transportation needs:    Medical: Not on file    Non-medical: Not on file  Tobacco Use  . Smoking status: Current Every Day Smoker    Packs/day: 0.50    Types: Cigarettes  . Smokeless tobacco: Never Used  . Tobacco comment: 1/30 avg 2/day  Substance and Sexual Activity  . Alcohol use: No  . Drug use: Yes    Types: Marijuana    Comment: trying to quit  . Sexual activity: Yes  Lifestyle  . Physical activity:    Days per week: Not on file    Minutes per session: Not on file  . Stress: Not on file  Relationships  . Social connections:    Talks on phone: Not on file    Gets together: Not on file    Attends religious service: Not on file    Active member of club or organization: Not on file    Attends meetings of clubs or organizations: Not on file    Relationship status: Not on file  Other Topics Concern  . Not on file  Social History Narrative   Patient lives in 2 story home with her mother.   High school education    Allergies:  Allergies  Allergen Reactions  . Keppra [Levetiracetam] Other (See Comments)    Reaction:  Headaches     Metabolic Disorder Labs: Lab Results  Component Value Date   HGBA1C 4.5 (L) 01/23/2017   No results found for: PROLACTIN No results found for: CHOL, TRIG, HDL, CHOLHDL, VLDL, LDLCALC Lab Results  Component Value Date   TSH 1.180 01/23/2017   TSH 1.51 10/25/2016    Therapeutic Level Labs: No results found for: LITHIUM No results found for: VALPROATE No components found for:  CBMZ  Current Medications: Current Outpatient Medications  Medication Sig Dispense Refill  . citalopram (CELEXA) 20 MG tablet Take 1 tablet (20 mg total) by mouth daily. 30 tablet 2  . hydrOXYzine (ATARAX/VISTARIL) 25 MG tablet Take 1 tablet (25 mg total) by mouth 3 (three) times daily as needed  for anxiety. 90 tablet 2  . nicotine polacrilex (NICORETTE) 2 MG gum Take 1 each (2 mg total) by mouth as needed for smoking cessation. (Patient not taking: Reported on 09/05/2018) 100 tablet 0  . ondansetron (ZOFRAN) 4 MG tablet Take 1 tablet (4 mg total) by mouth every 8 (eight) hours as needed for nausea or vomiting. (Patient not taking: Reported on 09/05/2018) 5 tablet 0  . oxcarbazepine (TRILEPTAL) 600 MG tablet TAKE 1 & 1/2 TABLETS BY MOUTH TWICE A DAY 90 tablet 11  . potassium chloride (K-DUR,KLOR-CON) 10 MEQ tablet Take 1 tablet (10 mEq total) by mouth daily. (Patient not taking: Reported on 09/05/2018) 10 tablet 0  . topiramate (TOPAMAX) 100 MG tablet Take 3 tablets (300 mg total) by mouth at bedtime. 90 tablet 11  . traZODone (DESYREL) 100 MG tablet Take 1 tablet (100 mg total) by mouth at bedtime as needed for sleep. 30 tablet 2   No current facility-administered medications for this visit.     Psychiatric Specialty Exam: Review of Systems  Psychiatric/Behavioral: The patient is nervous/anxious.   All other systems reviewed and are negative.   There were no vitals taken for this visit.There is no height or weight on file to calculate BMI.  General Appearance: NA  Eye Contact:  NA  Speech:  Clear and Coherent  Volume:  Normal  Mood:  Anxious  Affect:  NA  Thought Process:  Goal Directed  Orientation:  Full (Time, Place, and Person)  Thought Content: Logical   Suicidal Thoughts:  No  Homicidal Thoughts:  No  Memory:  Immediate;   Good Recent;   Good Remote;   Good  Judgement:  Intact  Insight:  Good  Psychomotor Activity:  NA  Concentration:  Concentration: Good  Recall:  Good  Fund of Knowledge: Good  Language: Good  Akathisia:  NA  Handed:  Right  AIMS (if indicated): not done  Assets:  Communication Skills Desire for Improvement Housing Resilience  ADL's:  Intact  Cognition: WNL  Sleep:  Good   Screenings: PHQ2-9     [facility] from [facility]2017 in [facility] Office Visit from 12/12/2016 in [facility] Visit from [facility]/11/2016 in [facility] Visit from 10/30/2016 in [facility]  PHQ-2 Total Score  6  6  0  0  0  PHQ-9 Total Score  19  22  -  -  -       Assessment and Plan: 28 yo female with seizure disorder and headaches as well as major depressive disorder and  generalized anxiety disorder. She has started on citalopram 20 mg 7 weeks ago - good response and tolerability but run out of it few days ago. Sleep adequate with trazodone which she does not use every night. Hydroxyzine prn anxiety also is effective. She managed to refrain from smoking marijuana. She sounds brighter, no SI. There is no hx of mania or psychosis. Overall, her mood has improved and she has no desire to change her current medications/doses. She is mostly upset about denials of her SSI disability claims - has a Clinical research associate and is still pursuing disability. Plan: Continue citalopram, hydroxyzine and trazodone unchanged. Next medication management visit in 8 weeks.  Magdalene Patricia, MD 11/24/2018, 8:40 AM

## 2018-12-30 ENCOUNTER — Emergency Department (HOSPITAL_COMMUNITY)
Admission: EM | Admit: 2018-12-30 | Discharge: 2018-12-30 | Disposition: A | Discharge status: Home / Self Care | Payer: No Typology Code available for payment source | Attending: Emergency Medicine | Admitting: Emergency Medicine

## 2018-12-30 ENCOUNTER — Other Ambulatory Visit: Payer: Self-pay

## 2018-12-30 ENCOUNTER — Encounter (HOSPITAL_COMMUNITY): Payer: Self-pay

## 2018-12-30 DIAGNOSIS — R569 Unspecified convulsions: Secondary | ICD-10-CM

## 2018-12-30 DIAGNOSIS — F17210 Nicotine dependence, cigarettes, uncomplicated: Secondary | ICD-10-CM | POA: Insufficient documentation

## 2018-12-30 DIAGNOSIS — Z9114 Patient's other noncompliance with medication regimen: Secondary | ICD-10-CM | POA: Insufficient documentation

## 2018-12-30 DIAGNOSIS — Z79899 Other long term (current) drug therapy: Secondary | ICD-10-CM | POA: Insufficient documentation

## 2018-12-30 DIAGNOSIS — G40909 Epilepsy, unspecified, not intractable, without status epilepticus: Secondary | ICD-10-CM

## 2018-12-30 DIAGNOSIS — R51 Headache: Secondary | ICD-10-CM | POA: Insufficient documentation

## 2018-12-30 MED ORDER — ACETAMINOPHEN 325 MG PO TABS
650.0000 mg | ORAL_TABLET | Freq: Once | ORAL | Status: AC
  Administered 2018-12-30: 09:00:00 650 mg via ORAL
  Filled 2018-12-30: qty 2

## 2018-12-30 MED ORDER — FENTANYL CITRATE (PF) 100 MCG/2ML IJ SOLN
50.0000 ug | Freq: Once | INTRAMUSCULAR | Status: AC
  Administered 2018-12-30: 10:00:00 50 ug via INTRAMUSCULAR
  Filled 2018-12-30: qty 2

## 2018-12-30 MED ORDER — OXCARBAZEPINE 300 MG PO TABS
900.0000 mg | ORAL_TABLET | Freq: Once | ORAL | Status: AC
  Administered 2018-12-30: 09:00:00 900 mg via ORAL
  Filled 2018-12-30: qty 3

## 2018-12-30 MED ORDER — TOPIRAMATE 100 MG PO TABS
300.0000 mg | ORAL_TABLET | Freq: Once | ORAL | Status: AC
  Administered 2018-12-30: 09:00:00 300 mg via ORAL
  Filled 2018-12-30: qty 3

## 2018-12-30 MED ORDER — IBUPROFEN 200 MG PO TABS
400.0000 mg | ORAL_TABLET | Freq: Once | ORAL | Status: AC
  Administered 2018-12-30: 10:00:00 400 mg via ORAL
  Filled 2018-12-30: qty 2

## 2018-12-30 NOTE — ED Triage Notes (Signed)
Pt BIBA from home. Pt had 3 minute witnessed seizures. Pt has been out of meds for several days. Last took 2 seizure meds 3 days ago

## 2018-12-30 NOTE — Discharge Instructions (Addendum)
Take your usual prescriptions as previously directed.  Call your regular medical doctor today to schedule a follow up appointment this week.  Return to the Emergency Department immediately sooner if worsening.  ° °

## 2018-12-30 NOTE — ED Notes (Signed)
Pt still refusing to leave due to headache. MD made aware.

## 2018-12-30 NOTE — ED Notes (Signed)
Pt ambulated out of room. Pt stated "I've been here for an hour and need something for my headache". Pt informed provider needs to eval and will be given meds as soon as ordered. Pt educated on call light usage. Pt had been attempting to use phone.

## 2018-12-30 NOTE — ED Notes (Signed)
Bed: KG81 Expected date:  Expected time:  Means of arrival:  Comments: EMS: 40 F Witnessed Seizures

## 2018-12-30 NOTE — ED Provider Notes (Signed)
[facility] DEPT Provider Note   CSN: [id number] Arrival date & time: 12/30/18  0750     History   Chief Complaint Chief Complaint  Patient presents with   Seizures    HPI Lisa Shaw is a 28 y.o. female.     HPI Pt was seen at [facility]. Per pt, c/o sudden onset and resolution of one episode of seizure that occurred PTA. Pt states she was sleeping with her mother, when she was "woke up and was confused about all the people around" (EMS). Pt states her mother told her that she had a seizure. Endorses hx of seizures. States LD seizure meds was 2 days ago; states she has prescriptions for both her meds at the pharmacy but has not picked them up yet. Denies CP/palpitations, no SOB/cough, no fevers, no rash, no abd pain, no N/V/D, no focal motor weakness.     Past Medical History:  Diagnosis Date   Depression 04/29/2017   Epilepsy ([place])    Migraine    Seizures ([place]) 07/18/2016   Sleep disturbance 04/29/2017    Patient Active Problem List   Diagnosis Date Noted   GAD (generalized anxiety disorder) 09/05/2018   Cannabis use disorder, mild, abuse 09/05/2018   MDD (major depressive disorder), recurrent episode, mild ([place]) 02/12/2018   Depression 04/29/2017   Sleep disturbance 04/29/2017   Abdominal pain 10/29/2016   Seizures ([place]) 10/25/2016    History reviewed. No pertinent surgical history.   OB History   No obstetric history on file.      Home Medications    Prior to Admission medications   Medication Sig Start Date End Date Taking? Authorizing Provider  citalopram (CELEXA) 20 MG tablet Take 1 tablet (20 mg total) by mouth daily. 10/19/18 01/17/19  Pucilowski, Marchia Bond, MD  hydrOXYzine (ATARAX/VISTARIL) 25 MG tablet Take 1 tablet (25 mg total) by mouth 3 (three) times daily as needed for anxiety. 10/19/18   Pucilowski, Marchia Bond, MD  nicotine polacrilex (NICORETTE) 2 MG gum Take 1 each (2 mg total) by mouth as needed for  smoking cessation. Patient not taking: Reported on 09/05/2018 02/15/18   Derrill Center, NP  ondansetron (ZOFRAN) 4 MG tablet Take 1 tablet (4 mg total) by mouth every 8 (eight) hours as needed for nausea or vomiting. Patient not taking: Reported on 09/05/2018 05/14/18   Couture, Cortni S, PA-C  oxcarbazepine (TRILEPTAL) 600 MG tablet TAKE 1 & 1/2 TABLETS BY MOUTH TWICE A DAY 10/21/18   Cameron Sprang, MD  potassium chloride (K-DUR,KLOR-CON) 10 MEQ tablet Take 1 tablet (10 mEq total) by mouth daily. Patient not taking: Reported on 09/05/2018 02/16/18   Derrill Center, NP  topiramate (TOPAMAX) 100 MG tablet Take 3 tablets (300 mg total) by mouth at bedtime. 10/21/18   Cameron Sprang, MD  traZODone (DESYREL) 100 MG tablet Take 1 tablet (100 mg total) by mouth at bedtime as needed for sleep. 10/19/18 01/17/19  Pucilowski, Marchia Bond, MD    Family History Family History  Problem Relation Age of Onset   Seizures Other     Social History Social History   Tobacco Use   Smoking status: Current Every Day Smoker    Packs/day: 0.50    Types: Cigarettes   Smokeless tobacco: Never Used   Tobacco comment: 1/30 avg 2/day  Substance Use Topics   Alcohol use: No   Drug use: Not Currently    Types: Marijuana    Comment: trying to quit  Allergies   Patient has no active allergies.   Review of Systems Review of Systems ROS: Statement: All systems negative except as marked or noted in the HPI; Constitutional: Negative for fever and chills. ; ; Eyes: Negative for eye pain, redness and discharge. ; ; ENMT: Negative for ear pain, hoarseness, nasal congestion, sinus pressure and sore throat. ; ; Cardiovascular: Negative for chest pain, palpitations, diaphoresis, dyspnea and peripheral edema. ; ; Respiratory: Negative for cough, wheezing and stridor. ; ; Gastrointestinal: Negative for nausea, vomiting, diarrhea, abdominal pain, blood in stool, hematemesis, jaundice and rectal bleeding. . ; ;  Genitourinary: Negative for dysuria, flank pain and hematuria. ; ; Musculoskeletal: Negative for back pain and neck pain. Negative for swelling and trauma.; ; Skin: Negative for pruritus, rash, abrasions, blisters, bruising and skin lesion.; ; Neuro: Negative for headache, lightheadedness and neck stiffness. Negative for weakness, extremity weakness, paresthesias, +seizure.       Physical Exam Updated Vital Signs BP 113/73    Pulse 72    Temp 98 F (36.7 C) (Oral)    Resp (!) 24    Ht 5\' 7"  (1.702 m)    Wt 59 kg    LMP 12/16/2018    SpO2 100%    BMI 20.36 kg/m   Physical Exam 0830: Physical examination:  Nursing notes reviewed; Vital signs and O2 SAT reviewed;  Constitutional: Well developed, Well nourished, Well hydrated, In no acute distress; Head:  Normocephalic, atraumatic; Eyes: EOMI, PERRL, No scleral icterus; ENMT: Mouth and pharynx normal, Mucous membranes moist; Neck: Supple, Full range of motion, No lymphadenopathy; Cardiovascular: Regular rate and rhythm, No gallop; Respiratory: Breath sounds clear & equal bilaterally, No wheezes.  Speaking full sentences with ease, Normal respiratory effort/excursion; Chest: Nontender, Movement normal; Abdomen: Soft, Nontender, Nondistended, Normal bowel sounds; Genitourinary: No CVA tenderness; Extremities: Peripheral pulses normal, No tenderness, No edema, No calf edema or asymmetry.; Neuro: AA&Ox3, Major CN grossly intact.  Speech clear. No gross focal motor or sensory deficits in extremities.; Skin: Color normal, Warm, Dry.    ED Treatments / Results  Labs (all labs ordered are listed, but only abnormal results are displayed)   EKG    Radiology   Procedures Procedures (including critical care time)  Medications Ordered in ED Medications  acetaminophen (TYLENOL) tablet 650 mg (650 mg Oral Given 12/30/18 0838)  topiramate (TOPAMAX) tablet 300 mg (300 mg Oral Given 12/30/18 0846)  Oxcarbazepine (TRILEPTAL) tablet 900 mg (900 mg Oral  Given 12/30/18 0846)     Initial Impression / Assessment and Plan / ED Course  I have reviewed the triage vital signs and the nursing notes.  Pertinent labs & imaging results that were available during my care of the patient were reviewed by me and considered in my medical decision making (see chart for details).     MDM Reviewed: previous chart, nursing note and vitals     0920:  Pt has not taken her meds x2 days, so pt was given doses of her usual seizure meds while in the ED. Pt has tol PO well. SW to speak with pt regarding her meds affordability, etc. Pt encouraged to f/u with her PMD and Neuro MD for good continuity of care and control of her chronic seizures; Pt verb understanding. Will d/c stable.         Final Clinical Impressions(s) / ED Diagnoses   Final diagnoses:  None    ED Discharge Orders    None  Samuel JesterMcManus, Gaynor Ferreras, DO 01/04/19 [phone]

## 2018-12-30 NOTE — ED Notes (Signed)
Pt crying in room. Pt consoled. Pt provided tissues and therapeutic communication.

## 2018-12-30 NOTE — Progress Notes (Signed)
CSW consulted with ED RNCM to assist patient in obtaining her medications.   [place] Circle, LCSW Transitions of Care Department [facility] ED [phone]

## 2018-12-30 NOTE — Discharge Planning (Signed)
[facility] consulted regarding uninsured pt needing medication assistance. EDCM reviewed chart to find pt was connected with [employer] orange Card program and [facility]. EDCM advised pt visit [facility] pharmacy and re-connect with financial counselors to reactivate [employer].

## 2018-12-30 NOTE — ED Notes (Addendum)
Pt refusing to leave. Pt stating she still has a headache. MD made aware.

## 2019-01-07 ENCOUNTER — Other Ambulatory Visit: Payer: Self-pay

## 2019-01-07 ENCOUNTER — Telehealth (INDEPENDENT_AMBULATORY_CARE_PROVIDER_SITE_OTHER): Payer: Self-pay | Admitting: Neurology

## 2019-01-07 VITALS — Ht 67.0 in | Wt 130.0 lb

## 2019-01-07 DIAGNOSIS — G40009 Localization-related (focal) (partial) idiopathic epilepsy and epileptic syndromes with seizures of localized onset, not intractable, without status epilepticus: Secondary | ICD-10-CM

## 2019-01-07 MED ORDER — TOPIRAMATE 100 MG PO TABS: 300.0000 mg | ORAL_TABLET | Freq: Every day | ORAL | 11 refills | Status: DC

## 2019-01-07 MED ORDER — OXCARBAZEPINE 600 MG PO TABS: ORAL_TABLET | ORAL | 11 refills | Status: DC

## 2019-01-07 MED FILL — TOPIRAMATE 100 MG TABS: 100 | 30 days supply | Qty: 90 | Fill #0

## 2019-01-07 MED FILL — OXcarbazepine 600 MG TABS: 600 | 30 days supply | Qty: 90 | Fill #0

## 2019-01-07 NOTE — Progress Notes (Signed)
Virtual Visit via Telephone Note The purpose of this virtual visit is to provide medical care while limiting exposure to the novel coronavirus.    Consent was obtained for phone visit:  Yes.   Answered questions that patient had about telehealth interaction:  Yes.   I discussed the limitations, risks, security and privacy concerns of performing an evaluation and management service by telephone. I also discussed with the patient that there may be a patient responsible charge related to this service. The patient expressed understanding and agreed to proceed.  Pt location: Home Physician Location: office Name of referring provider:  Cameron Sprang, MD I connected with .Lisa Shaw at patients initiation/request on 01/07/2019 at 11:30 AM EDT by telephone and verified that I am speaking with the correct person using two identifiers.  Pt MRN:  [id number] Pt DOB:  1990/09/21   History of Present Illness:  The patient was last seen in February 2020 for focal to bilateral tonic-clonic epilepsy. Prior EEG reported left frontal sharp waves. She was in the ER on 12/30/2018 for a nocturnal seizure witnessed by her mother with tongue bite and urinary incontinence. Her mother was trying to wake her up and sit her up, she kept sliding and hit bedrail. Per report, she had missed 2 days of her seizure medications, she states she only missed one day, however due to cost, she has been taking oxcarbazepine only once a day 900mg  qhs. She is taking the Topiramate 300mg  qhs. She did feel that the oxcarbazepine had been helping her but it was too expensive to pay out of pocket. Prior to this, her last nocturnal convulsion was in June 2018. She continues to report staring spells. A 48-hour EEG in December 2019 was normal, however typical events were not captured. She had a bad headache for a day after the seizure. She was previously reporting daily headaches and taking BC powders daily. She states headaches are not  occurring on a daily basis, mostly occurring when she is moody or thinking a lot (which can be everyday). She does not take the [facility] powder daily any longer. She is now seeing [facility] but continues to deal with depression from her life circumstances. Sleep is better with Trazodone.   History on Initial Assessment 12/02/2017: This is a pleasant 28 year old right-handed woman with a history of seizures since 2013. Records from [facility] and [facility] were reviewed and will be summarized here, she is depressed in the office today and is a poor historian. She initially had nocturnal seizures with described diffuse rhythmic jerking with eye rolling backwards. She had an EEG in 06/2012 reporting left frontal sharps. There is also note of episodes in 2012 where she would have staring spells lasting around 2 minutes with associated diaphoresis and post-ictal confusion with violence usually at night or early in the morning. Both seizure types were associated with tongue bite and urinary incontinence. There is report of deja vu, and she reports tingling in both hands during the "stare offs." She lives with her mother but is mostly by herself at home during the day, and feels she has the "stare off" episodes around twice a week. She was initially started on Keppra which caused mood issues, then switched to Topamax. It appears she initially had mental slowing on Topamax, but has been taking 300mg  qhs since 2015. She had seen Dr. Leta Baptist at [facility] last year and was started on Vimpat which she stopped again due to side effects. She  reports the last nocturnal convulsion was in June 2018 when she woke up feeling weird with her tongue sore and with a headache. She was given a headache cocktail but was also noted to be very anxious and depressed. She was [facility] in July 2018 for suicidal ideation. She currently denies any suicidal ideation but continues to report significant depression.  She reports body twitches in her arms or legs. She has headaches on a daily basis, taking an average of 2 Tylenol or BC powder daily. She feels it is due to "overthinking or depression." She has sleep difficulties with interrupted 5-6 hours of sleep at night. She feels fatigued and drowsy during the day. She has dizziness when standing. Vision is occasionally blurred when watching TV. She has noticed swallowing difficulties. She notices her right foot gets numb frequently and her balance is off. No falls. 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   Epilepsy Risk Factors:  Her maternal great grandmother and great aunt had seizures. Otherwise she had a normal birth and early development.  There is no history of febrile convulsions, CNS infections such as meningitis/encephalitis, significant traumatic brain injury, neurosurgical procedures.  Prior AEDs: Keppra, Vimpat Laboratory Data:  EEGs: EEG in 06/2012 reported as abnormal secondary to focal sharp wave activity in the left frontal region MRI: I personally reviewed MRI brain without contrast done 06/2012 which was degraded by motion. Lateral ventricles are mildly prominent in size with a slightly rounded appearance, no transependymal CSF flow. Hippocampi appear symmetric with no abnormal signal seen.   Outpatient Encounter Medications as of 01/07/2019  Medication Sig   citalopram (CELEXA) 20 MG tablet Take 1 tablet (20 mg total) by mouth daily.   hydrOXYzine (ATARAX/VISTARIL) 25 MG tablet Take 1 tablet (25 mg total) by mouth 3 (three) times daily as needed for anxiety.   ondansetron (ZOFRAN) 4 MG tablet Take 1 tablet (4 mg total) by mouth every 8 (eight) hours as needed for nausea or vomiting.   oxcarbazepine (TRILEPTAL) 600 MG tablet TAKE 1 & 1/2 TABLETS BY MOUTH TWICE A DAY (Patient taking 1.5 tabs qhs)   topiramate (TOPAMAX) 100 MG tablet Take 3 tablets (300 mg total) by  mouth at bedtime.   traZODone (DESYREL) 100 MG tablet Take 1 tablet (100 mg total) by mouth at bedtime as needed for sleep.   [DISCONTINUED] nicotine polacrilex (NICORETTE) 2 MG gum Take 1 each (2 mg total) by mouth as needed for smoking cessation. (Patient not taking: Reported on 09/05/2018)   [DISCONTINUED] potassium chloride (K-DUR,KLOR-CON) 10 MEQ tablet Take 1 tablet (10 mEq total) by mouth daily. (Patient not taking: Reported on 09/05/2018)   No facility-administered encounter medications on file as of 01/07/2019.      Observations/Objective:  Limited due to nature of phone visit. Patient is awake, alert, able to answer questions with no dysarthria noted.   Assessment and Plan:   This is a pleasant 28 yo RH woman with a history of depression, migraines, and seizures. 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 Prior EEG in 2013 reported left frontal sharp waves, MRI degraded by motion but no acute changes seen. She had a nocturnal convulsion last 12/30/2018 after missing a day of medication, but also she had cut down oxcarbazepine to 900mg  qhs due to cost. She continues on Topiramate 300mg  qhs. She was given the contact information  for the [facility] [place] to hopefully provide assistance with medications to be able to continue oxcarbazepine 900mg  BID and topiramate 300mg  qhs. Continue follow-up with Behavioral Health. She does not drive, no pregnancy plans. She will follow-up in 3-4 months and knows to call for any changes.   Follow Up Instructions:    -I discussed the assessment and treatment plan with the patient. The patient was provided an opportunity to ask questions and all were answered. The patient agreed with the plan and demonstrated an understanding of the instructions.   The patient was advised to call back or seek an in-person evaluation if the symptoms worsen or if the condition fails to improve as  anticipated.    Total Time spent in visit with the patient was:  15 minutes, of which 100% of the time was spent in counseling and/or coordinating care on the above.   Pt understands and agrees with the plan of care outlined.     Van ClinesKaren M Rick Carruthers, MD

## 2019-01-13 ENCOUNTER — Ambulatory Visit (INDEPENDENT_AMBULATORY_CARE_PROVIDER_SITE_OTHER): Payer: Medicaid Other | Admitting: Psychiatry

## 2019-01-13 ENCOUNTER — Other Ambulatory Visit: Payer: Self-pay

## 2019-01-13 DIAGNOSIS — F1211 Cannabis abuse, in remission: Secondary | ICD-10-CM | POA: Diagnosis not present

## 2019-01-13 DIAGNOSIS — F411 Generalized anxiety disorder: Secondary | ICD-10-CM | POA: Diagnosis not present

## 2019-01-13 DIAGNOSIS — F330 Major depressive disorder, recurrent, mild: Secondary | ICD-10-CM

## 2019-01-13 MED ORDER — TRAZODONE HCL 100 MG PO TABS: 100.0000 mg | ORAL_TABLET | Freq: Every evening | ORAL | 0 refills | Status: DC | PRN

## 2019-01-13 MED ORDER — CITALOPRAM HYDROBROMIDE 20 MG PO TABS: 20.0000 mg | ORAL_TABLET | Freq: Every day | ORAL | 0 refills | Status: DC

## 2019-01-13 MED ORDER — HYDROXYZINE HCL 25 MG PO TABS: 25.0000 mg | ORAL_TABLET | Freq: Three times a day (TID) | ORAL | 2 refills | Status: DC | PRN

## 2019-01-13 MED FILL — traZODone HCL 100 MG TABS: 100 | 30 days supply | Qty: 30 | Fill #0

## 2019-01-13 MED FILL — hydrOXYzine HCL 25 MG TABS: 25 | 30 days supply | Qty: 90 | Fill #0

## 2019-01-13 MED FILL — CITALOPRAM HBR 20 MG TABLET: 20 | 30 days supply | Qty: 30 | Fill #0

## 2019-01-13 NOTE — Progress Notes (Signed)
BH MD/PA/NP OP Progress Note  01/13/2019 8:43 AM Lisa Shaw  MRN:  [id number] Interview was conducted by phone and I verified that I was speaking with the correct person using two identifiers. I discussed the limitations of evaluation and management by telemedicine and  the availability of in person appointments. Patient expressed understanding and agreed to proceed.  Chief Complaint: Anxiety.  HPI: 28 yo female with seizure disorder and headachesas well asmajor depressive disorder and generalized anxiety disorder.She has been on 20 mg citalopram for 3 moths now-good response and tolerability. Sleep adequate with trazodone which she does not use every night. Hydroxyzine prn anxiety also is effective. She managed to refrain from smoking marijuana for close to 6 months now. She sounds brighter, no SI.There is no hx of mania or psychosis.Overall, her mood has improved and she has no desire to change her current medications/doses. She is anxious about upcoming disability hearing (02/03/19) as she has been denied so far.  Visit Diagnosis:    ICD-10-CM   1. GAD (generalized anxiety disorder)  F41.1   2. MDD (major depressive disorder), recurrent episode, mild ([place])  F33.0   3. Cannabis use disorder, mild, in early remission  F12.11     Past Psychiatric History: Please see intake H&P.  Past Medical History:  Past Medical History:  Diagnosis Date  . Depression 04/29/2017  . Epilepsy ([place])   . Migraine   . Seizures ([place]) 07/18/2016  . Sleep disturbance 04/29/2017   No past surgical history on file.  Family Psychiatric History: None  Family History:  Family History  Problem Relation Age of Onset  . Seizures Other     Social History:  Social History   Socioeconomic History  . Marital status: Single    Spouse name: Not on file  . Number of children: 0  . Years of education: 12th  . 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 Financial resource strain: Not on file  . Food insecurity    Worry: Not on file    Inability: Not on file  . Transportation needs    Medical: Not on file    Non-medical: Not on file  Tobacco Use  . Smoking status: Current Every Day Smoker    Packs/day: 0.50    Types: Cigarettes  . Smokeless tobacco: Never Used  . Tobacco comment: 1/30 avg 2/day  Substance and Sexual Activity  . Alcohol use: No  . Drug use: Not Currently    Types: Marijuana    Comment: trying to quit  . Sexual activity: Yes  Lifestyle  . Physical activity    Days per week: Not on file    Minutes per session: Not on file  . Stress: Not on file  Relationships  . Social Herbalist on phone: Not on file    Gets together: Not on file    Attends religious service: Not on file    Active member of club or organization: Not on file    Attends meetings of clubs or organizations: Not on file    Relationship status: Not on file  Other Topics Concern  . Not on file  Social History Narrative   Patient lives in 2 story home with her mother.   High school education   Right handed     Allergies: No Known Allergies  Metabolic Disorder Labs: Lab Results  Component Value Date  HGBA1C 4.5 (L) 01/23/2017   No results found for: PROLACTIN No results found for: CHOL, TRIG, HDL, CHOLHDL, VLDL, LDLCALC Lab Results  Component Value Date   TSH 1.180 01/23/2017   TSH 1.51 10/25/2016    Therapeutic Level Labs: No results found for: LITHIUM No results found for: VALPROATE No components found for:  CBMZ  Current Medications: Current Outpatient Medications  Medication Sig Dispense Refill  . citalopram (CELEXA) 20 MG tablet Take 1 tablet (20 mg total) by mouth daily. 30 tablet 2  . hydrOXYzine (ATARAX/VISTARIL) 25 MG tablet Take 1 tablet (25 mg total) by mouth 3 (three) times daily as needed for anxiety. 90 tablet 2  . ondansetron (ZOFRAN) 4 MG tablet Take 1 tablet (4 mg total) by  mouth every 8 (eight) hours as needed for nausea or vomiting. 5 tablet 0  . oxcarbazepine (TRILEPTAL) 600 MG tablet TAKE 1 & 1/2 TABLETS BY MOUTH TWICE A DAY 90 tablet 11  . topiramate (TOPAMAX) 100 MG tablet Take 3 tablets (300 mg total) by mouth at bedtime. 90 tablet 11  . traZODone (DESYREL) 100 MG tablet Take 1 tablet (100 mg total) by mouth at bedtime as needed for sleep. 30 tablet 2   No current facility-administered medications for this visit.      Psychiatric Specialty Exam: Review of Systems  Neurological: Positive for seizures and headaches.  Psychiatric/Behavioral: The patient is nervous/anxious.   All other systems reviewed and are negative.   Last menstrual period 12/16/2018.There is no height or weight on file to calculate BMI.  General Appearance: NA  Eye Contact:  NA  Speech:  Clear and Coherent  Volume:  Decreased  Mood:  Anxious  Affect:  NA  Thought Process:  Goal Directed  Orientation:  Full (Time, Place, and Person)  Thought Content: Logical   Suicidal Thoughts:  No  Homicidal Thoughts:  No  Memory:  Immediate;   Good Recent;   Good Remote;   Good  Judgement:  Good  Insight:  Fair  Psychomotor Activity:  Normal  Concentration:  Concentration: Good  Recall:  Good  Fund of Knowledge: Good  Language: Good  Akathisia:  Negative  Handed:  Right  AIMS (if indicated): not done  Assets:  Communication Skills Desire for Improvement Housing Resilience  ADL's:  Intact  Cognition: WNL  Sleep:  Good   Screenings: PHQ2-9     Office Visit from 04/15/2017 in [facility] Office Visit from 01/28/2017 in [facility] Office Visit from 12/12/2016 in [facility] Office Visit from 12/11/2016 in [facility] Office Visit from 10/30/2016 in [facility]  PHQ-2 Total Score  6  6  0  0  0  PHQ-9 Total Score  19  22  -  -  -       Assessment and Plan: 28 yo female with seizure  disorder and headachesas well asmajor depressive disorder and generalized anxiety disorder.She has been on 20 mg citalopram for 3 moths now-good response and tolerability. Sleep adequate with trazodone which she does not use every night. Hydroxyzine prn anxiety also is effective. She managed to refrain from smoking marijuana for close to 6 months now. She sounds brighter, no SI.There is no hx of mania or psychosis.Overall, her mood has improved and she has no desire to change her current medications/doses. She is under financial stress - no insurance and no reliable source of income. She is anxious about  upcoming disability hearing (02/03/19) as she has been denied so far.  Plan: Continue citalopram, hydroxyzine and trazodone unchanged. Next medication management visit in 2 months. The plan was discussed with patient who had an opportunity to ask questions and these were all answered. I spend 20 minutes in phone contact with the patient.   Magdalene Patricialgierd A Herald Vallin, MD 01/13/2019, 8:43 AM

## 2019-01-14 ENCOUNTER — Ambulatory Visit (HOSPITAL_COMMUNITY): Payer: Medicaid Other | Admitting: Psychiatry

## 2019-01-18 IMAGING — CT CT ABD-PELV W/ CM
2 of 4 series · 17 of 46 positions shown, 19 images · IV contrast (ISOVUE)
Comparison: 10/21/2016

CLINICAL DATA: Epigastric pain with nausea and vomiting beginning
this morning.

EXAM:
CT ABDOMEN AND PELVIS WITH CONTRAST
TECHNIQUE: Multidetector CT imaging of the abdomen and pelvis was performed
using the standard protocol following bolus administration of
intravenous contrast.
CONTRAST:  100mL [id number] IOPAMIDOL (13CR26-7FF) INJECTION 61%

[Series 2: axial st · axial · 0.68mm/px · z∈[+1181,+1546]mm · 14 of 83 slices shown, 16 images]
[im 5/83  soft-tissue]
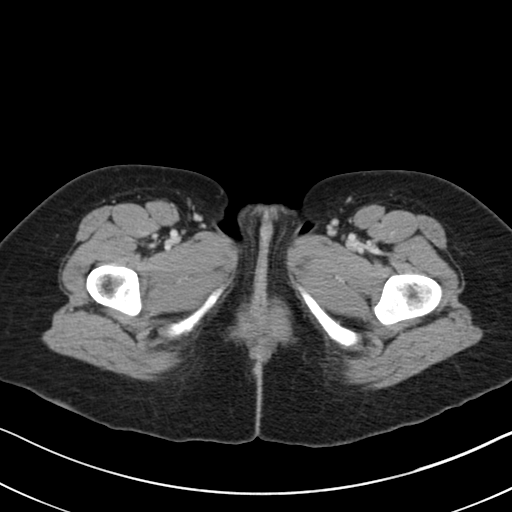
[im 5/83  bone]
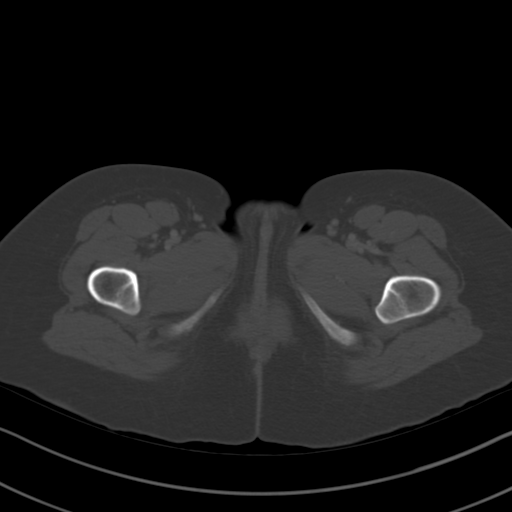
[im 9/83  soft-tissue]
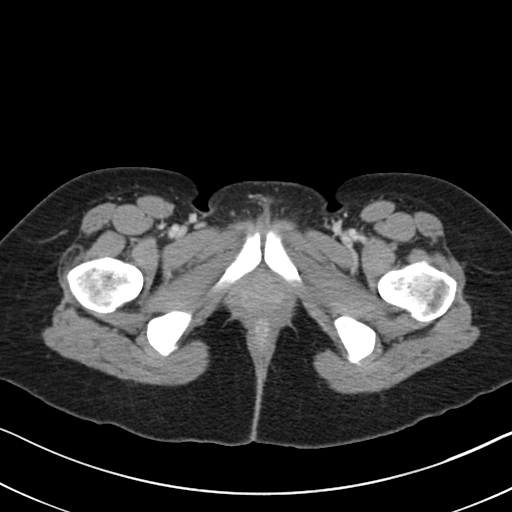
[im 18/83  soft-tissue]
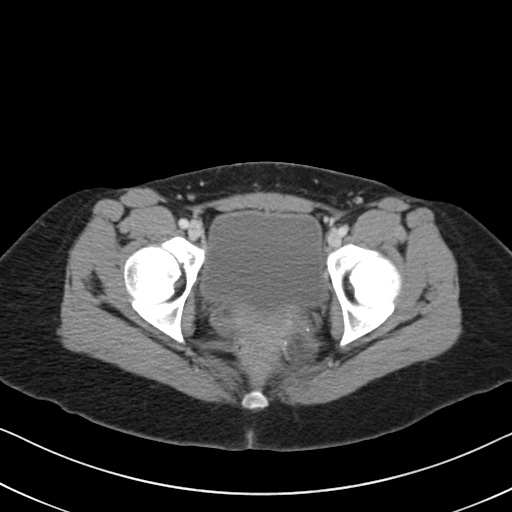
[im 22/83  soft-tissue]
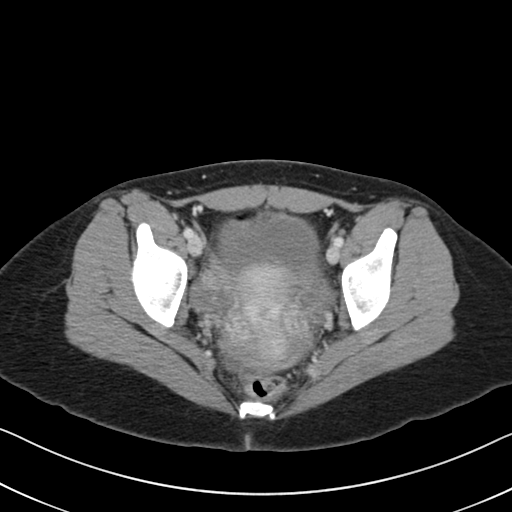
[im 26/83  soft-tissue]
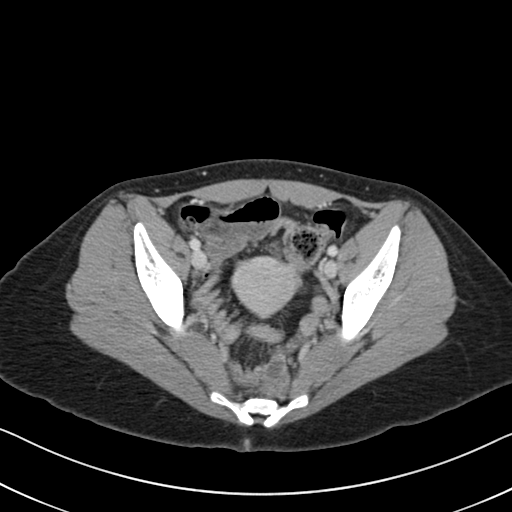
[im 35/83  soft-tissue]
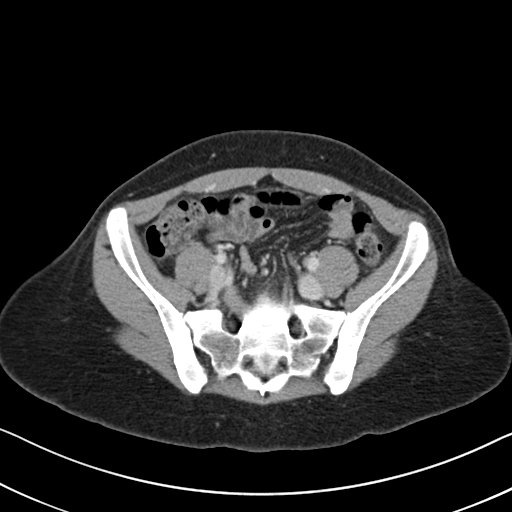
[im 39/83  soft-tissue]
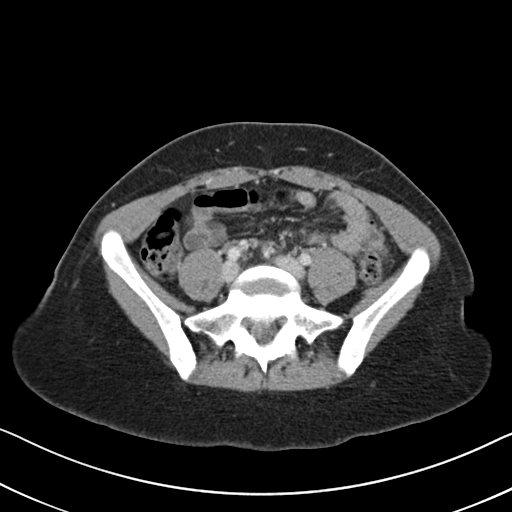
[im 44/83  soft-tissue]
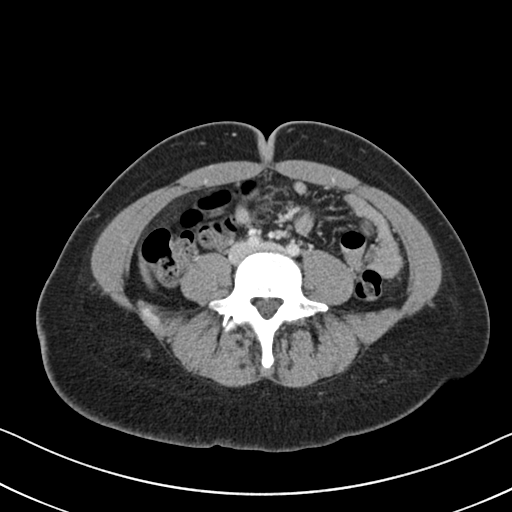
[im 48/83  soft-tissue]
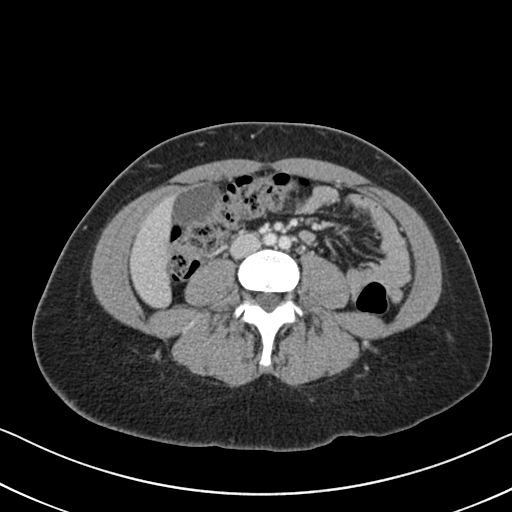
[im 48/83  bone]
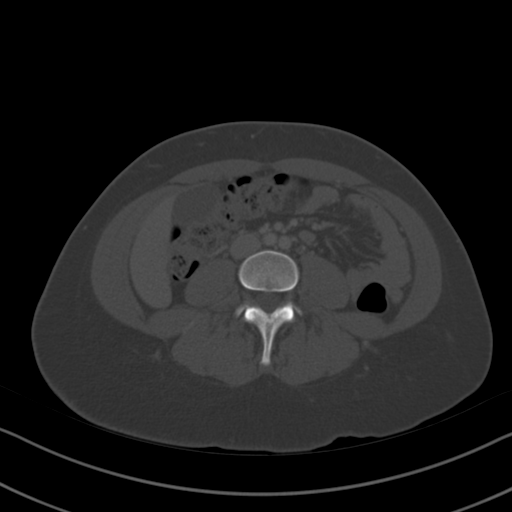
[im 57/83  soft-tissue]
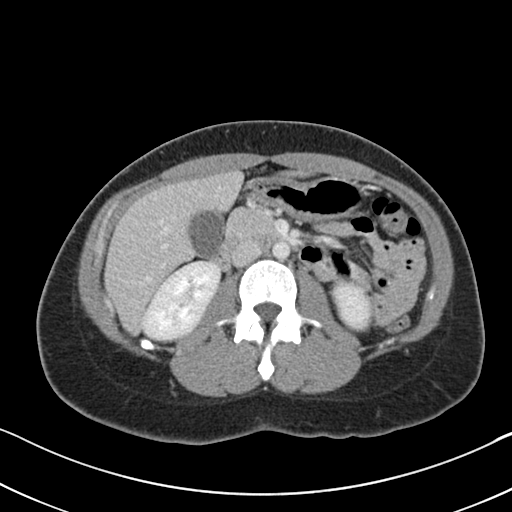
[im 61/83  soft-tissue]
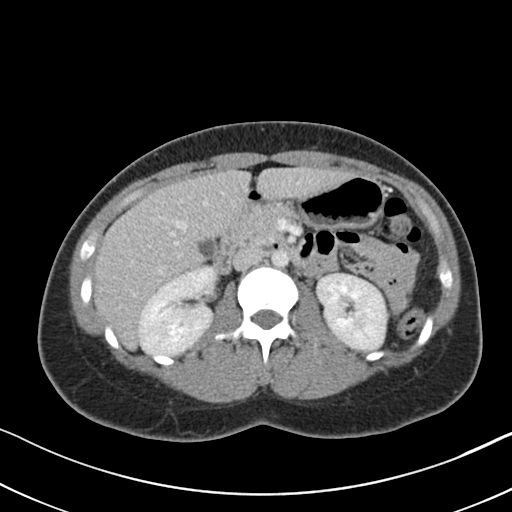
[im 65/83  soft-tissue]
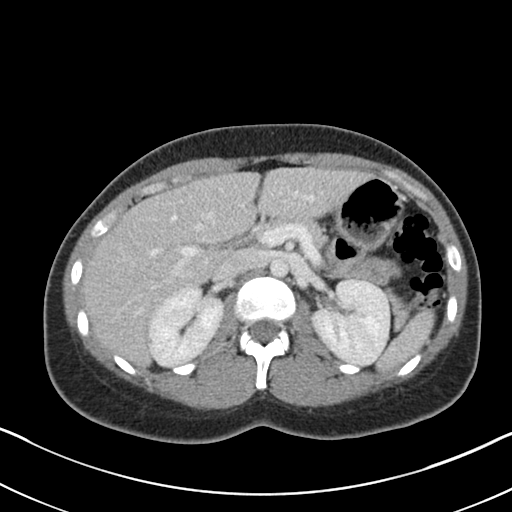
[im 74/83  soft-tissue]
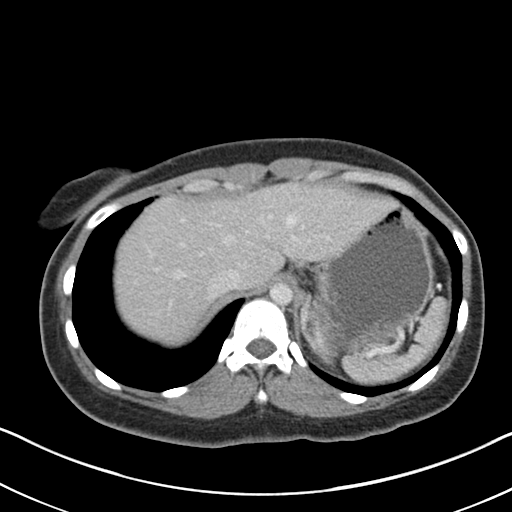
[im 78/83  soft-tissue]
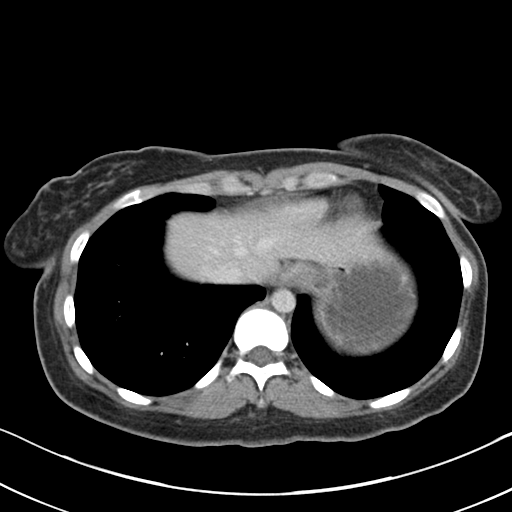

[Series 5: coronal st · coronal · 0.73mm/px · 3 of 71 slices shown]
[im 24/71  soft-tissue]
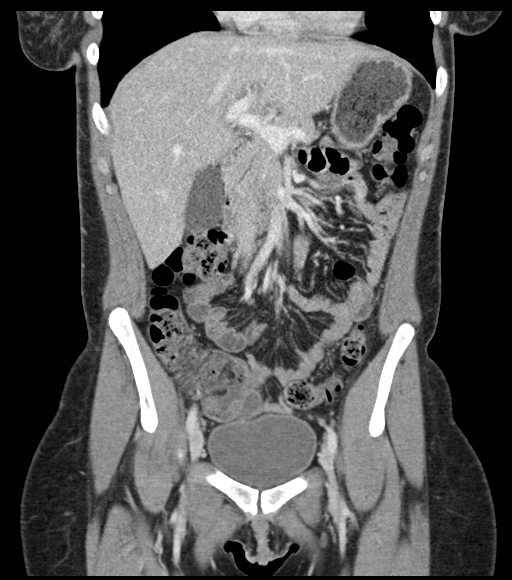
[im 32/71  soft-tissue]
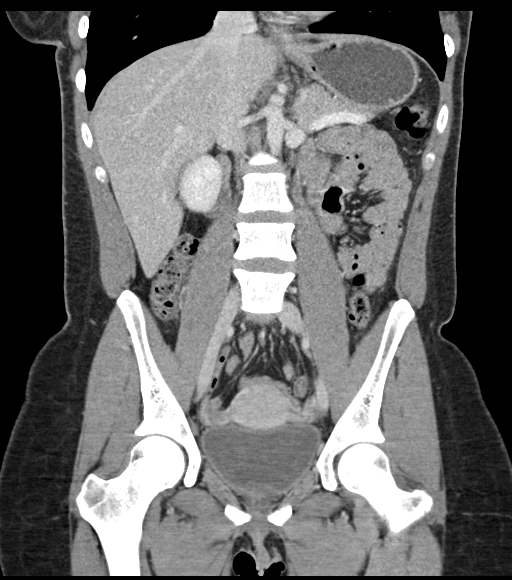
[im 39/71  soft-tissue]
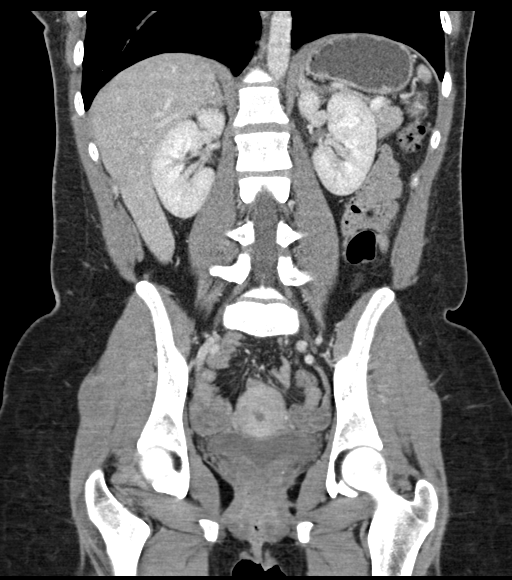

[17 of 46 positions shown; findings below may reference images not displayed]

FINDINGS: Lower chest: Lung bases are normal.

Hepatobiliary: Liver, gallbladder and biliary tree are normal.

Pancreas: Normal.

Spleen: Normal.

Adrenals/Urinary Tract: Adrenal glands are normal. Kidneys normal
size without hydronephrosis or nephrolithiasis. Ureters and bladder
are normal.

Stomach/Bowel: Stomach and small bowel are normal. Appendix is
normal. Colon is normal.

Vascular/Lymphatic: Normal.

Reproductive: Normal.

Other: No free fluid or focal inflammatory change. No free
peritoneal air.

Musculoskeletal: Normal.
IMPRESSION: No acute findings in the abdomen/pelvis.

## 2019-01-21 ENCOUNTER — Ambulatory Visit: Payer: Medicaid Other | Admitting: Family Medicine

## 2019-01-29 ENCOUNTER — Ambulatory Visit: Payer: Medicaid Other | Admitting: Family Medicine

## 2019-01-29 MED FILL — traZODone HCL 100 MG TABS: 100 | 30 days supply | Qty: 30 | Fill #0

## 2019-01-29 MED FILL — hydrOXYzine HCL 25 MG TABS: 25 | 30 days supply | Qty: 90 | Fill #0

## 2019-01-29 MED FILL — CITALOPRAM HBR 20 MG TABLET: 20 | 30 days supply | Qty: 30 | Fill #0

## 2019-01-29 MED FILL — TOPIRAMATE 100 MG TABS: 100 | 30 days supply | Qty: 90 | Fill #0

## 2019-01-29 MED FILL — OXcarbazepine 600 MG TABS: 600 | 30 days supply | Qty: 90 | Fill #0

## 2019-02-23 ENCOUNTER — Other Ambulatory Visit: Payer: Self-pay

## 2019-02-23 DIAGNOSIS — Z20822 Contact with and (suspected) exposure to covid-19: Secondary | ICD-10-CM

## 2019-02-23 MED FILL — TOPIRAMATE 100 MG TABS: 100 | 30 days supply | Qty: 90 | Fill #1

## 2019-02-24 LAB — NOVEL CORONAVIRUS, NAA: SARS-CoV-2, NAA: NOT DETECTED

## 2019-03-16 ENCOUNTER — Ambulatory Visit (INDEPENDENT_AMBULATORY_CARE_PROVIDER_SITE_OTHER): Payer: Medicare Other | Admitting: Psychiatry

## 2019-03-16 ENCOUNTER — Other Ambulatory Visit: Payer: Self-pay

## 2019-03-16 DIAGNOSIS — F411 Generalized anxiety disorder: Secondary | ICD-10-CM | POA: Diagnosis not present

## 2019-03-16 DIAGNOSIS — F1211 Cannabis abuse, in remission: Secondary | ICD-10-CM

## 2019-03-16 DIAGNOSIS — F330 Major depressive disorder, recurrent, mild: Secondary | ICD-10-CM | POA: Diagnosis not present

## 2019-03-16 MED ORDER — TRAZODONE HCL 100 MG PO TABS: 100.0000 mg | ORAL_TABLET | Freq: Every evening | ORAL | 0 refills | Status: DC | PRN

## 2019-03-16 MED ORDER — CITALOPRAM HYDROBROMIDE 20 MG PO TABS: 20.0000 mg | ORAL_TABLET | Freq: Every day | ORAL | 0 refills | Status: DC

## 2019-03-16 MED ORDER — HYDROXYZINE HCL 25 MG PO TABS: 25.0000 mg | ORAL_TABLET | Freq: Three times a day (TID) | ORAL | 2 refills | Status: DC | PRN

## 2019-03-16 NOTE — Progress Notes (Signed)
BH MD/PA/NP OP Progress Note  03/16/2019 9:40 AM Lisa Shaw  MRN:  [id number] Interview was conducted by phone and I verified that I was speaking with the correct person using two identifiers. I discussed the limitations of evaluation and management by telemedicine and  the availability of in person appointments. Patient expressed understanding and agreed to proceed.  Chief Complaint: Anxiety.  HPI: 27yo female with seizure disorder and headachesas well asmajor depressive disorder and generalized anxiety disorder.She has been on 20 mg citalopram withgood response and tolerability. Sleep adequate with trazodone which she does not use every night. Hydroxyzine prn anxiety also is effective. She managed to refrain from smoking marijuana for 8 months. She soundsbrighter, no SI.There is no hx of mania or psychosis.She has been granted disability in late July and now has health insurance. Her mood has improved: no depression and mild situational anxiety. She is still under financial stress - no reliable source of income. She reports mode headaches and poor appetite - she is on Trileptal and Topamax for the above.  Visit Diagnosis:    ICD-10-CM   1. MDD (major depressive disorder), recurrent episode, mild ([facility])  F33.0   2. GAD (generalized anxiety disorder)  F41.1   3. Cannabis use disorder, mild, in early remission  F12.11     Past Psychiatric History: Please see intake H&P.  Past Medical History:  Past Medical History:  Diagnosis Date  . Depression 04/29/2017  . Epilepsy ([facility])   . Migraine   . Seizures ([facility]) 07/18/2016  . Sleep disturbance 04/29/2017   No past surgical history on file.  Family Psychiatric History: None.  Family History:  Family History  Problem Relation Age of Onset  . Seizures Other     Social History:  Social History   Socioeconomic History  . Marital status: Single    Spouse name: Not on file  . Number of children: 0  . Years of education:  12th  . 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 Financial resource strain: Not on file  . Food insecurity    Worry: Not on file    Inability: Not on file  . Transportation needs    Medical: Not on file    Non-medical: Not on file  Tobacco Use  . Smoking status: Current Every Day Smoker    Packs/day: 0.50    Types: Cigarettes  . Smokeless tobacco: Never Used  . Tobacco comment: 1/30 avg 2/day  Substance and Sexual Activity  . Alcohol use: No  . Drug use: Not Currently    Types: Marijuana    Comment: trying to quit  . Sexual activity: Yes  Lifestyle  . Physical activity    Days per week: Not on file    Minutes per session: Not on file  . Stress: Not on file  Relationships  . Social Musician on phone: Not on file    Gets together: Not on file    Attends religious service: Not on file    Active member of club or organization: Not on file    Attends meetings of clubs or organizations: Not on file    Relationship status: Not on file  Other Topics Concern  . Not on file  Social History Narrative   Patient lives in 2 story home with her mother.   High school education   Right handed     Allergies:  No Known Allergies  Metabolic Disorder Labs: Lab Results  Component Value Date   HGBA1C 4.5 (L) 01/23/2017   No results found for: PROLACTIN No results found for: CHOL, TRIG, HDL, CHOLHDL, VLDL, LDLCALC Lab Results  Component Value Date   TSH 1.180 01/23/2017   TSH 1.51 10/25/2016    Therapeutic Level Labs: No results found for: LITHIUM No results found for: VALPROATE No components found for:  CBMZ  Current Medications: Current Outpatient Medications  Medication Sig Dispense Refill  . citalopram (CELEXA) 20 MG tablet Take 1 tablet (20 mg total) by mouth daily. 90 tablet 0  . hydrOXYzine (ATARAX/VISTARIL) 25 MG tablet Take 1 tablet (25 mg total) by mouth 3 (three) times daily as  needed for anxiety. 90 tablet 2  . ondansetron (ZOFRAN) 4 MG tablet Take 1 tablet (4 mg total) by mouth every 8 (eight) hours as needed for nausea or vomiting. 5 tablet 0  . oxcarbazepine (TRILEPTAL) 600 MG tablet TAKE 1 & 1/2 TABLETS BY MOUTH TWICE A DAY 90 tablet 11  . topiramate (TOPAMAX) 100 MG tablet Take 3 tablets (300 mg total) by mouth at bedtime. 90 tablet 11  . traZODone (DESYREL) 100 MG tablet Take 1 tablet (100 mg total) by mouth at bedtime as needed for sleep. 90 tablet 0   No current facility-administered medications for this visit.      Psychiatric Specialty Exam: Review of Systems  Neurological: Positive for headaches.  Psychiatric/Behavioral: The patient is nervous/anxious.   All other systems reviewed and are negative.   There were no vitals taken for this visit.There is no height or weight on file to calculate BMI.  General Appearance: NA  Eye Contact:  NA  Speech:  Clear and Coherent and Normal Rate  Volume:  Normal  Mood:  Anxious  Affect:  NA  Thought Process:  Goal Directed and Linear  Orientation:  Full (Time, Place, and Person)  Thought Content: Logical   Suicidal Thoughts:  No  Homicidal Thoughts:  No  Memory:  Immediate;   Good Recent;   Good Remote;   Good  Judgement:  Good  Insight:  Fair  Psychomotor Activity:  NA  Concentration:  Concentration: Good  Recall:  Good  Fund of Knowledge: Good  Language: Good  Akathisia:  Negative  Handed:  Right  AIMS (if indicated): not done  Assets:  Communication Skills Desire for Improvement Housing Resilience Social Support  ADL's:  Intact  Cognition: WNL  Sleep:  Good   Screenings: PHQ2-9     Office Visit from 04/15/2017 in [facility] Office Visit from [facility]/2017 in [facility] Office Visit from 12/12/2016 in [facility] Office Visit from 12[facility]11/2016 in [facility] Office Visit from 10/30/2016 in [facility]   PHQ-2 Total Score  6  6  0  0  0  PHQ-9 Total Score  19  22  -  -  -       Assessment and Plan: 28yo female with seizure disorder and headachesas well asmajor depressive disorder and generalized anxiety disorder.She has been on 20 mg citalopram withgood response and tolerability. Sleep adequate with trazodone which she does not use every night. Hydroxyzine prn anxiety also is effective. She managed to refrain from smoking marijuana for 8 months. She soundsbrighter, no SI.There is no hx of mania or psychosis.She has been granted disability in late July and now has health insurance. Her mood has improved:  no depression and mild situational anxiety.She is still under financial stress - no reliable source of income. She reports mode headaches and poor appetite - she is on Trileptal and Topamax for the above.  Plan: Continue citalopram, hydroxyzine and trazodone unchanged. Next medication management visit in2 months. The plan was discussed with patient who had an opportunity to ask questions and these were all answered. I spend 25 minutes in phone consultation with the patien   Stephanie Acre, MD 03/16/2019, 9:40 AM

## 2019-03-19 ENCOUNTER — Encounter: Payer: Self-pay | Admitting: Neurology

## 2019-03-19 ENCOUNTER — Telehealth (INDEPENDENT_AMBULATORY_CARE_PROVIDER_SITE_OTHER): Payer: Medicare Other | Admitting: Neurology

## 2019-03-19 ENCOUNTER — Other Ambulatory Visit: Payer: Self-pay

## 2019-03-19 VITALS — Ht 67.0 in | Wt 135.0 lb

## 2019-03-19 DIAGNOSIS — G43009 Migraine without aura, not intractable, without status migrainosus: Secondary | ICD-10-CM

## 2019-03-19 DIAGNOSIS — G40009 Localization-related (focal) (partial) idiopathic epilepsy and epileptic syndromes with seizures of localized onset, not intractable, without status epilepticus: Secondary | ICD-10-CM

## 2019-03-19 MED ORDER — TOPIRAMATE 100 MG PO TABS: 300.0000 mg | ORAL_TABLET | Freq: Every day | ORAL | 11 refills | Status: DC

## 2019-03-19 MED ORDER — OXCARBAZEPINE 600 MG PO TABS: ORAL_TABLET | ORAL | 11 refills | Status: DC

## 2019-03-19 NOTE — Progress Notes (Signed)
Virtual Visit via Telephone Note The purpose of this virtual visit is to provide medical care while limiting exposure to the novel coronavirus.    Consent was obtained for phone visit:  Yes.   Answered questions that patient had about telehealth interaction:  Yes.   I discussed the limitations, risks, security and privacy concerns of performing an evaluation and management service by telephone. I also discussed with the patient that there may be a patient responsible charge related to this service. The patient expressed understanding and agreed to proceed.  Pt location: Home Physician Location: office Name of referring provider:  Mack Hook, MD I connected with .Lisa Shaw at patients initiation/request on 03/19/2019 at  8:30 AM EDT by telephone and verified that I am speaking with the correct person using two identifiers.  Pt MRN:  [id number] Pt DOB:  [birth year]   History of Present Illness:  The patient had a telephone visit on 03/19/2019. 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 Prior EEG reported left frontal sharp waves. Since her last visit, she reports she is overall feeling okay. No seizures since 12/30/2018 (nocturnal seizure with tongue bite and incontinence). She has recently been approved for Medicare and will hopefully be able to obtain medications as prescribed. She has only been taking the oxcarbazepine 600mg  1.5 tabs every morning instead of BID due to cost. She continues on Topiramate 300mg  qhs. She denies any staring/unresponsive episodes, olfactory/gustatory hallucinations, focal numbness/tingling/weakness, myoclonic jerks. The headaches have not been as bad, she had 2 bad ones in the past 1.5 weeks due to real bad stress. She continues to see Psychiatry with no recent medication changes made. Sleep is better on Trazodone. She denies any dizziness, diplopia, no falls.   History on  Initial Assessment 12/02/2017: This is a pleasant 28 year old right-handed woman with a history of seizures since 2013. Records from [facility] and [facility] were reviewed and will be summarized here, she is depressed in the office today and is a poor historian. She initially had nocturnal seizures with described diffuse rhythmic jerking with eye rolling backwards. She had an EEG in 06/2012 reporting left frontal sharps. There is also note of episodes in 2012 where she would have staring spells lasting around 2 minutes with associated diaphoresis and post-ictal confusion with violence usually at night or early in the morning. Both seizure types were associated with tongue bite and urinary incontinence. There is report of deja vu, and she reports tingling in both hands during the "stare offs." She lives with her mother but is mostly by herself at home during the day, and feels she has the "stare off" episodes around twice a week. She was initially started on Keppra which caused mood issues, then switched to Topamax. It appears she initially had mental slowing on Topamax, but has been taking 300mg  qhs since 2015. She had seen Dr. Leta Baptist at [facility] last year and was started on Vimpat which she stopped again due to side effects. She reports the last nocturnal convulsion was in June 2018 when she woke up feeling weird with her tongue sore and with a headache. She was given a headache cocktail but was also noted to be very anxious and depressed. She was admitted for involuntary commitment at [facility] in July 2018 for suicidal ideation. She currently denies any suicidal ideation but continues to report significant depression. She reports body twitches in her arms or legs.  She has headaches on a daily basis, taking an average of 2 Tylenol or BC powder daily. She feels it is due to "overthinking or depression." She has sleep difficulties with interrupted 5-6 hours of sleep at night. She feels fatigued and drowsy during the day.  She has dizziness when standing. Vision is occasionally blurred when watching TV. She has noticed swallowing difficulties. She notices her right foot gets numb frequently and her balance is off. No falls. 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   Epilepsy Risk Factors:  Her maternal great grandmother and great aunt had seizures. Otherwise she had a normal birth and early development.  There is no history of febrile convulsions, CNS infections such as meningitis/encephalitis, significant traumatic brain injury, neurosurgical procedures.  Prior AEDs: Keppra, Vimpat Laboratory Data:  EEGs: EEG in 06/2012 reported as abnormal secondary to focal sharp wave activity in the left frontal region MRI: I personally reviewed MRI brain without contrast done 06/2012 which was degraded by motion. Lateral ventricles are mildly prominent in size with a slightly rounded appearance, no transependymal CSF flow. Hippocampi appear symmetric with no abnormal signal seen.  Outpatient Encounter Medications as of 03/19/2019  Medication Sig  . citalopram (CELEXA) 20 MG tablet Take 1 tablet (20 mg total) by mouth daily.  . hydrOXYzine (ATARAX/VISTARIL) 25 MG tablet Take 1 tablet (25 mg total) by mouth 3 (three) times daily as needed for anxiety.  . ondansetron (ZOFRAN) 4 MG tablet Take 1 tablet (4 mg total) by mouth every 8 (eight) hours as needed for nausea or vomiting.  Marland Kitchen oxcarbazepine (TRILEPTAL) 600 MG tablet TAKE 1 & 1/2 TABLETS BY MOUTH TWICE A DAY  . topiramate (TOPAMAX) 100 MG tablet Take 3 tablets (300 mg total) by mouth at bedtime.  . traZODone (DESYREL) 100 MG tablet Take 1 tablet (100 mg total) by mouth at bedtime as needed for sleep.  .    .     No facility-administered encounter medications on file as of 03/19/2019.       Observations/Objective:  The patient is awake, alert, able to answer questions without dysarthria or confusion.    Assessment and Plan:   This is a pleasant 28 yo RH woman with a history of depression, migraines, and seizures. 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 Prior EEG in 2013 reported left frontal sharp waves, MRI degraded by motion but no acute changes seen. Last seizure was on 12/30/2018 after missing a day of medication. She has had to self-reduce oxcarbazepine to once a day due to cost, but now with insurance is instructed to resume oxcarbazepine 900mg  BID and continue Topiramate 300mg  qhs. Continue follow-up with Behavioral Health. She does not drive, no pregnancy plans. Follow-up in 6 months, she knows to call for any changes.    Follow Up Instructions:   -I discussed the assessment and treatment plan with the patient. The patient was provided an opportunity to ask questions and all were answered. The patient agreed with the plan and demonstrated an understanding of the instructions.   The patient was advised to call back or seek an in-person evaluation if the symptoms worsen or if the condition fails to improve as anticipated.    Total Time spent in visit with the patient was:  6 minutes, of which 100% of the time was spent in counseling and/or coordinating care on the above.   Pt  understands and agrees with the plan of care outlined.     Van ClinesKaren M Deryck Hippler, MD

## 2019-04-02 ENCOUNTER — Other Ambulatory Visit: Payer: Self-pay

## 2019-04-02 ENCOUNTER — Encounter: Payer: Self-pay | Admitting: Family Medicine

## 2019-04-02 ENCOUNTER — Ambulatory Visit (INDEPENDENT_AMBULATORY_CARE_PROVIDER_SITE_OTHER): Payer: Medicare Other | Admitting: Family Medicine

## 2019-04-02 VITALS — BP 119/76 | HR 83 | Temp 98.3°F | Ht 67.0 in | Wt 148.8 lb

## 2019-04-02 DIAGNOSIS — Z862 Personal history of diseases of the blood and blood-forming organs and certain disorders involving the immune mechanism: Secondary | ICD-10-CM | POA: Insufficient documentation

## 2019-04-02 DIAGNOSIS — Z7689 Persons encountering health services in other specified circumstances: Secondary | ICD-10-CM | POA: Diagnosis not present

## 2019-04-02 DIAGNOSIS — Z09 Encounter for follow-up examination after completed treatment for conditions other than malignant neoplasm: Secondary | ICD-10-CM

## 2019-04-02 DIAGNOSIS — Z0000 Encounter for general adult medical examination without abnormal findings: Secondary | ICD-10-CM

## 2019-04-02 DIAGNOSIS — Z23 Encounter for immunization: Secondary | ICD-10-CM

## 2019-04-02 DIAGNOSIS — R110 Nausea: Secondary | ICD-10-CM | POA: Diagnosis not present

## 2019-04-02 DIAGNOSIS — T148XXA Other injury of unspecified body region, initial encounter: Secondary | ICD-10-CM | POA: Insufficient documentation

## 2019-04-02 LAB — POCT URINALYSIS DIPSTICK
Bilirubin, UA: NEGATIVE
Blood, UA: NEGATIVE
Glucose, UA: NEGATIVE
Ketones, UA: NEGATIVE
Nitrite, UA: NEGATIVE
Protein, UA: NEGATIVE
Spec Grav, UA: 1.015 (ref 1.010–1.025)
Urobilinogen, UA: 0.2 E.U./dL
pH, UA: 7 (ref 5.0–8.0)

## 2019-04-02 MED ORDER — CYCLOBENZAPRINE HCL 10 MG PO TABS: 10.0000 mg | ORAL_TABLET | Freq: Three times a day (TID) | ORAL | 3 refills | Status: DC | PRN

## 2019-04-02 MED ORDER — ONDANSETRON HCL 4 MG PO TABS: 4.0000 mg | ORAL_TABLET | Freq: Three times a day (TID) | ORAL | 2 refills | Status: DC | PRN

## 2019-04-02 NOTE — Progress Notes (Signed)
[facility]   New Patient--Establish Care  Subjective:  Patient ID: Lisa Shaw, female    DOB: January 03, 1991  Age: 28 y.o. MRN: [id number]  CC:  Chief Complaint  Patient presents with  . New Patient (Initial Visit)    re establishing, having back lower , help mother move , used heat pad on it     HPI Lisa Shaw is a 28 year old female who presents to Establish Care today.   Past Medical History:  Diagnosis Date  . Depression 04/29/2017  . Epilepsy (HCC)   . Migraine   . Seizures (HCC) 07/18/2016  . Sleep disturbance 04/29/2017   Current Status: This will be Ms. Barnhart's  initial office visit with me. She was previously seeing PCP at [facility], [address] here in [place] for his PCP needs. Since her last office visit, she is doing well with no complaints. She has history of Seizures, which she has not experienced since 12/2018 She is currently taking Topamax for symptoms. Her anxiety is moderate today. She denies suicidal ideations, homicidal ideations, or auditory hallucinations. She has c/o of muscle pain in her legs and thighs X 2 weeks now. She denies injury. She has been soaking in warm water, and using muscle rub with minimal relief. She denies fevers, chills, fatigue, recent infections, weight loss, and night sweats. She has not had any headaches, visual changes, dizziness, and falls. No chest pain, heart palpitations, cough and shortness of breath reported. No reports of GI problems such as nausea, vomiting, diarrhea, and constipation. She has no reports of blood in stools, dysuria and hematuria. She denies pain today.   No past surgical history on file.  Family History  Problem Relation Age of Onset  . Seizures Other     Social History   Socioeconomic History  . Marital status: Single    Spouse name: Not on file  . Number of children: 0  . Years of education: 12th  . 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 Financial resource strain: Not on file  . Food insecurity    Worry: Not on file    Inability: Not on file  . Transportation needs    Medical: Not on file    Non-medical: Not on file  Tobacco Use  . Smoking status: Current Every Day Smoker    Packs/day: 0.50    Types: Cigarettes  . Smokeless tobacco: Never Used  . Tobacco comment: 1/30 avg 2/day  Substance and Sexual Activity  . Alcohol use: No  . Drug use: Not Currently    Types: Marijuana    Comment: trying to quit  . Sexual activity: Yes  Lifestyle  . Physical activity    Days per week: Not on file    Minutes per session: Not on file  . Stress: Not on file  Relationships  . Social Musician on phone: Not on file    Gets together: Not on file    Attends religious service: Not on file    Active member of club or organization: Not on file    Attends meetings of clubs or organizations: Not on file    Relationship status: Not on file  . Intimate partner violence    Fear of current or ex partner: Not on file    Emotionally abused: Not on file  Physically abused: Not on file    Forced sexual activity: Not on file  Other Topics Concern  . Not on file  Social History Narrative   Patient lives in 2 story home with her mother.   High school education   Right handed     Outpatient Medications Prior to Visit  Medication Sig Dispense Refill  . citalopram (CELEXA) 20 MG tablet Take 1 tablet (20 mg total) by mouth daily. 90 tablet 0  . hydrOXYzine (ATARAX/VISTARIL) 25 MG tablet Take 1 tablet (25 mg total) by mouth 3 (three) times daily as needed for anxiety. 90 tablet 2  . oxcarbazepine (TRILEPTAL) 600 MG tablet TAKE 1 & 1/2 TABLETS BY MOUTH TWICE A DAY 90 tablet 11  . topiramate (TOPAMAX) 100 MG tablet Take 3 tablets (300 mg total) by mouth at bedtime. 90 tablet 11  . traZODone (DESYREL) 100 MG tablet Take 1  tablet (100 mg total) by mouth at bedtime as needed for sleep. 90 tablet 0  . ondansetron (ZOFRAN) 4 MG tablet Take 1 tablet (4 mg total) by mouth every 8 (eight) hours as needed for nausea or vomiting. (Patient not taking: Reported on 04/02/2019) 5 tablet 0   No facility-administered medications prior to visit.     No Known Allergies  ROS Review of Systems  Constitutional: Negative.   HENT: Negative.   Eyes: Negative.   Respiratory: Negative.   Cardiovascular: Negative.   Gastrointestinal: Negative.   Endocrine: Negative.   Genitourinary: Negative.   Musculoskeletal: Positive for arthralgias (generalized leg pain).  Skin: Negative.   Allergic/Immunologic: Negative.   Neurological: Positive for dizziness (occasional) and seizures (occasional).  Hematological: Negative.   Psychiatric/Behavioral: Negative.    Objective:    Physical Exam  Constitutional: She is oriented to person, place, and time. She appears well-developed and well-nourished.  HENT:  Head: Normocephalic and atraumatic.  Eyes: Conjunctivae are normal.  Neck: Normal range of motion. Neck supple.  Cardiovascular: Normal rate, regular rhythm, normal heart sounds and intact distal pulses.  Pulmonary/Chest: Effort normal and breath sounds normal.  Abdominal: Soft. Bowel sounds are normal.  Musculoskeletal: Normal range of motion.  Neurological: She is alert and oriented to person, place, and time. She has normal reflexes.  Skin: Skin is warm and dry.  Psychiatric: She has a normal mood and affect. Her behavior is normal. Judgment and thought content normal.  Nursing note and vitals reviewed.   BP 119/76 (BP Location: Left Arm, Patient Position: Sitting, Cuff Size: Normal)   Pulse 83   Temp 98.3 F (36.8 C) (Oral)   Ht 5\' 7"  (1.702 m)   Wt 148 lb 12.8 oz (67.5 kg)   SpO2 100%   BMI 23.31 kg/m  Wt Readings from Last 3 Encounters:  04/02/19 148 lb 12.8 oz (67.5 kg)  03/19/19 135 lb (61.2 kg)  01/07/19 130  lb (59 kg)     Health Maintenance Due  Topic Date Due  . INFLUENZA VACCINE  02/13/2019    There are no preventive care reminders to display for this patient.  Lab Results  Component Value Date   TSH 1.180 01/23/2017   Lab Results  Component Value Date   WBC 8.6 05/14/2018   HGB 13.1 05/14/2018   HCT 40.6 05/14/2018   MCV 102.5 (H) 05/14/2018   PLT 299 05/14/2018   Lab Results  Component Value Date   NA 139 05/14/2018   K 4.1 05/14/2018   CO2 18 (L) 05/14/2018   GLUCOSE 102 (H) 05/14/2018  BUN 12 05/14/2018   CREATININE 0.80 05/14/2018   BILITOT 0.5 05/14/2018   ALKPHOS 68 05/14/2018   AST 21 05/14/2018   ALT 17 05/14/2018   PROT 7.5 05/14/2018   ALBUMIN 4.6 05/14/2018   CALCIUM 9.3 05/14/2018   ANIONGAP 9 05/14/2018   No results found for: CHOL No results found for: HDL No results found for: LDLCALC No results found for: TRIG No results found for: CHOLHDL Lab Results  Component Value Date   HGBA1C 4.5 (L) 01/23/2017   Assessment & Plan:   1. Encounter to establish care  2. Muscle strain We will initiate muscle relaxer today.  - cyclobenzaprine (FLEXERIL) 10 MG tablet; Take 1 tablet (10 mg total) by mouth 3 (three) times daily as needed for muscle spasms.  Dispense: 30 tablet; Refill: 3  3. Nausea - ondansetron (ZOFRAN) 4 MG tablet; Take 1 tablet (4 mg total) by mouth every 8 (eight) hours as needed for nausea or vomiting.  Dispense: 20 tablet; Refill: 2  4. History of anemia - CBC with Differential  5. Need for influenza vaccination - Flu Vaccine QUAD 6+ mos PF IM (Fluarix Quad PF)  6. Healthcare maintenance - POCT Urinalysis Dipstick - Comprehensive metabolic panel - TSH  7. Follow up She will follow up in 1 months,  Meds ordered this encounter  Medications  . DISCONTD: ondansetron (ZOFRAN) 4 MG tablet    Sig: Take 1 tablet (4 mg total) by mouth every 8 (eight) hours as needed for nausea or vomiting.    Dispense:  20 tablet    Refill:   2  . ondansetron (ZOFRAN) 4 MG tablet    Sig: Take 1 tablet (4 mg total) by mouth every 8 (eight) hours as needed for nausea or vomiting.    Dispense:  20 tablet    Refill:  2  . cyclobenzaprine (FLEXERIL) 10 MG tablet    Sig: Take 1 tablet (10 mg total) by mouth 3 (three) times daily as needed for muscle spasms.    Dispense:  30 tablet    Refill:  3   Orders Placed This Encounter  Procedures  . Flu Vaccine QUAD 6+ mos PF IM (Fluarix Quad PF)  . Comprehensive metabolic panel  . CBC with Differential  . TSH  . POCT Urinalysis Dipstick   Referral Orders  No referral(s) requested today   Raliegh IpNatalie Ethelwyn Gilbertson,  MSN, FNP-BC [facility] [address]  [place], [place] [id number] [phone] fax   Problem List Items Addressed This Visit    None    Visit Diagnoses    Encounter to establish care    -  Primary   Muscle strain       Relevant Medications   cyclobenzaprine (FLEXERIL) 10 MG tablet   Nausea       Relevant Medications   ondansetron (ZOFRAN) 4 MG tablet   History of anemia       Relevant Orders   CBC with Differential   Need for influenza vaccination       Relevant Orders   Flu Vaccine QUAD 6+ mos PF IM (Fluarix Quad PF)   Healthcare maintenance       Relevant Orders   POCT Urinalysis Dipstick (Completed)   Comprehensive metabolic panel   TSH   Follow up          Meds ordered this encounter  Medications  . DISCONTD: ondansetron (ZOFRAN) 4 MG tablet  Sig: Take 1 tablet (4 mg total) by mouth every 8 (eight) hours as needed for nausea or vomiting.    Dispense:  20 tablet    Refill:  2  . ondansetron (ZOFRAN) 4 MG tablet    Sig: Take 1 tablet (4 mg total) by mouth every 8 (eight) hours as needed for nausea or vomiting.    Dispense:  20 tablet    Refill:  2  . cyclobenzaprine (FLEXERIL) 10 MG tablet    Sig: Take 1 tablet (10 mg total) by mouth 3 (three) times daily as needed for  muscle spasms.    Dispense:  30 tablet    Refill:  3    Follow-up: Return in about 1 month (around 05/02/2019).    Azzie Glatter, FNP

## 2019-04-02 NOTE — Patient Instructions (Signed)
Cyclobenzaprine tablets What is this medicine? CYCLOBENZAPRINE (sye kloe BEN za preen) is a muscle relaxer. It is used to treat muscle pain, spasms, and stiffness. This medicine may be used for other purposes; ask your health care provider or pharmacist if you have questions. COMMON BRAND NAME(S): Fexmid, Flexeril What should I tell my health care provider before I take this medicine? They need to know if you have any of these conditions:  heart disease, irregular heartbeat, or previous heart attack  liver disease  thyroid problem  an unusual or allergic reaction to cyclobenzaprine, tricyclic antidepressants, lactose, other medicines, foods, dyes, or preservatives  pregnant or trying to get pregnant  breast-feeding How should I use this medicine? Take this medicine by mouth with a glass of water. Follow the directions on the prescription label. If this medicine upsets your stomach, take it with food or milk. Take your medicine at regular intervals. Do not take it more often than directed. Talk to your pediatrician regarding the use of this medicine in children. Special care may be needed. Overdosage: If you think you have taken too much of this medicine contact a poison control center or emergency room at once. NOTE: This medicine is only for you. Do not share this medicine with others. What if I miss a dose? If you miss a dose, take it as soon as you can. If it is almost time for your next dose, take only that dose. Do not take double or extra doses. What may interact with this medicine? Do not take this medicine with any of the following medications:  MAOIs like Carbex, Eldepryl, Marplan, Nardil, and Parnate  narcotic medicines for cough  safinamide This medicine may also interact with the following medications:  alcohol  bupropion  antihistamines for allergy, cough and cold  certain medicines for anxiety or sleep  certain medicines for bladder problems like oxybutynin,  tolterodine  certain medicines for depression like amitriptyline, fluoxetine, sertraline  certain medicines for Parkinson's disease like benztropine, trihexyphenidyl  certain medicines for seizures like phenobarbital, primidone  certain medicines for stomach problems like dicyclomine, hyoscyamine  certain medicines for travel sickness like scopolamine  general anesthetics like halothane, isoflurane, methoxyflurane, propofol  ipratropium  local anesthetics like lidocaine, pramoxine, tetracaine  medicines that relax muscles for surgery  narcotic medicines for pain  phenothiazines like chlorpromazine, mesoridazine, prochlorperazine, thioridazine  verapamil This list may not describe all possible interactions. Give your health care provider a list of all the medicines, herbs, non-prescription drugs, or dietary supplements you use. Also tell them if you smoke, drink alcohol, or use illegal drugs. Some items may interact with your medicine. What should I watch for while using this medicine? Tell your doctor or health care professional if your symptoms do not start to get better or if they get worse. You may get drowsy or dizzy. Do not drive, use machinery, or do anything that needs mental alertness until you know how this medicine affects you. Do not stand or sit up quickly, especially if you are an older patient. This reduces the risk of dizzy or fainting spells. Alcohol may interfere with the effect of this medicine. Avoid alcoholic drinks. If you are taking another medicine that also causes drowsiness, you may have more side effects. Give your health care provider a list of all medicines you use. Your doctor will tell you how much medicine to take. Do not take more medicine than directed. Call emergency for help if you have problems breathing or unusual sleepiness.  Your mouth may get dry. Chewing sugarless gum or sucking hard candy, and drinking plenty of water may help. Contact your  doctor if the problem does not go away or is severe. What side effects may I notice from receiving this medicine? Side effects that you should report to your doctor or health care professional as soon as possible:  allergic reactions like skin rash, itching or hives, swelling of the face, lips, or tongue  breathing problems  chest pain  fast, irregular heartbeat  hallucinations  seizures  unusually weak or tired Side effects that usually do not require medical attention (report to your doctor or health care professional if they continue or are bothersome):  headache  nausea, vomiting This list may not describe all possible side effects. Call your doctor for medical advice about side effects. You may report side effects to FDA at 1-800-FDA-1088. Where should I keep my medicine? Keep out of the reach of children. Store at room temperature between 15 and 30 degrees C (59 and 86 degrees F). Keep container tightly closed. Throw away any unused medicine after the expiration date. NOTE: This sheet is a summary. It may not cover all possible information. If you have questions about this medicine, talk to your doctor, pharmacist, or health care provider.  2020 Elsevier/Gold Standard (2018-06-03 12:49:26) Muscle Strain A muscle strain is an injury that happens when a muscle is stretched longer than normal. This can happen during a fall, sports, or lifting. This can tear some muscle fibers. Usually, recovery from muscle strain takes 1-2 weeks. Complete healing normally takes 5-6 weeks. This condition is first treated with PRICE therapy. This involves:  Protecting your muscle from being injured again.  Resting your injured muscle.  Icing your injured muscle.  Applying pressure (compression) to your injured muscle. This may be done with a splint or elastic bandage.  Raising (elevating) your injured muscle. Your doctor may also recommend medicine for pain. Follow these instructions at  home: If you have a splint:  Wear the splint as told by your doctor. Take it off only as told by your doctor.  Loosen the splint if your fingers or toes tingle, get numb, or turn cold and blue.  Keep the splint clean.  If the splint is not waterproof: ? Do not let it get wet. ? Cover it with a watertight covering when you take a bath or a shower. Managing pain, stiffness, and swelling   If directed, put ice on your injured area. ? If you have a removable splint, take it off as told by your doctor. ? Put ice in a plastic bag. ? Place a towel between your skin and the bag. ? Leave the ice on for 20 minutes, 2-3 times a day.  Move your fingers or toes often. This helps to avoid stiffness and lessen swelling.  Raise your injured area above the level of your heart while you are sitting or lying down.  Wear an elastic bandage as told by your doctor. Make sure it is not too tight. General instructions  Take over-the-counter and prescription medicines only as told by your doctor.  Limit your activity. Rest your injured muscle as told by your doctor. Your doctor may say that gentle movements are okay.  If physical therapy was prescribed, do exercises as told by your doctor.  Do not put pressure on any part of the splint until it is fully hardened. This may take many hours.  Do not use any products that contain  nicotine or tobacco, such as cigarettes and e-cigarettes. These can delay bone healing. If you need help quitting, ask your doctor.  Warm up before you exercise. This helps to prevent more muscle strains.  Ask your doctor when it is safe to drive if you have a splint.  Keep all follow-up visits as told by your doctor. This is important. Contact a doctor if:  You have more pain or swelling in your injured area. Get help right away if:  You have any of these problems in your injured area: ? You have numbness. ? You have tingling. ? You lose a lot of  strength. Summary  A muscle strain is an injury that happens when a muscle is stretched longer than normal.  This condition is first treated with PRICE therapy. This includes protecting, resting, icing, adding pressure, and raising your injury.  Limit your activity. Rest your injured muscle as told by your doctor. Your doctor may say that gentle movements are okay.  Warm up before you exercise. This helps to prevent more muscle strains. This information is not intended to replace advice given to you by your health care provider. Make sure you discuss any questions you have with your health care provider. Document Released: 04/09/2008 Document Revised: 08/27/2018 Document Reviewed: 08/07/2016 Elsevier Patient Education  2020 [employer].

## 2019-04-03 LAB — CBC WITH DIFFERENTIAL/PLATELET
Basophils Absolute: 0.1 10*3/uL (ref 0.0–0.2)
Basos: 1 %
EOS (ABSOLUTE): 0 10*3/uL (ref 0.0–0.4)
Eos: 1 %
Hematocrit: 41.7 % (ref 34.0–46.6)
Hemoglobin: 13.7 g/dL (ref 11.1–15.9)
Immature Grans (Abs): 0 10*3/uL (ref 0.0–0.1)
Immature Granulocytes: 1 %
Lymphocytes Absolute: 2.3 10*3/uL (ref 0.7–3.1)
Lymphs: 43 %
MCH: 32.9 pg (ref 26.6–33.0)
MCHC: 32.9 g/dL (ref 31.5–35.7)
MCV: 100 fL — ABNORMAL HIGH (ref 79–97)
Monocytes Absolute: 0.4 10*3/uL (ref 0.1–0.9)
Monocytes: 7 %
Neutrophils Absolute: 2.6 10*3/uL (ref 1.4–7.0)
Neutrophils: 47 %
Platelets: 313 10*3/uL (ref 150–450)
RBC: 4.16 x10E6/uL (ref 3.77–5.28)
RDW: 12.1 % (ref 11.7–15.4)
WBC: 5.4 10*3/uL (ref 3.4–10.8)

## 2019-04-03 LAB — COMPREHENSIVE METABOLIC PANEL
ALT: 15 IU/L (ref 0–32)
AST: 16 IU/L (ref 0–40)
Albumin/Globulin Ratio: 1.8 (ref 1.2–2.2)
Albumin: 4.5 g/dL (ref 3.9–5.0)
Alkaline Phosphatase: 89 IU/L (ref 39–117)
BUN/Creatinine Ratio: 13 (ref 9–23)
BUN: 10 mg/dL (ref 6–20)
Bilirubin Total: 0.2 mg/dL (ref 0.0–1.2)
CO2: 21 mmol/L (ref 20–29)
Calcium: 9.8 mg/dL (ref 8.7–10.2)
Chloride: 105 mmol/L (ref 96–106)
Creatinine, Ser: 0.78 mg/dL (ref 0.57–1.00)
GFR calc Af Amer: 120 mL/min/{1.73_m2} (ref >59)
GFR calc non Af Amer: 105 mL/min/{1.73_m2} (ref >59)
Globulin, Total: 2.5 g/dL (ref 1.5–4.5)
Glucose: 55 mg/dL — ABNORMAL LOW (ref 65–99)
Potassium: 4 mmol/L (ref 3.5–5.2)
Sodium: 136 mmol/L (ref 134–144)
Total Protein: 7 g/dL (ref 6.0–8.5)

## 2019-04-03 LAB — TSH: TSH: 0.989 u[IU]/mL (ref 0.450–4.500)

## 2019-05-03 ENCOUNTER — Ambulatory Visit: Payer: Self-pay | Admitting: Family Medicine

## 2019-05-17 ENCOUNTER — Ambulatory Visit (INDEPENDENT_AMBULATORY_CARE_PROVIDER_SITE_OTHER): Payer: Medicare HMO | Admitting: Psychiatry

## 2019-05-17 ENCOUNTER — Other Ambulatory Visit: Payer: Self-pay

## 2019-05-17 DIAGNOSIS — F1211 Cannabis abuse, in remission: Secondary | ICD-10-CM

## 2019-05-17 DIAGNOSIS — F3341 Major depressive disorder, recurrent, in partial remission: Secondary | ICD-10-CM | POA: Diagnosis not present

## 2019-05-17 DIAGNOSIS — F411 Generalized anxiety disorder: Secondary | ICD-10-CM

## 2019-05-17 MED ORDER — CITALOPRAM HYDROBROMIDE 20 MG PO TABS: 20.0000 mg | ORAL_TABLET | Freq: Every day | ORAL | 0 refills | Status: DC

## 2019-05-17 NOTE — Progress Notes (Signed)
BH MD/PA/NP OP Progress Note  05/17/2019 2:40 PM Lisa Shaw  MRN:  [id number] Interview was conducted by phone and I verified that I was speaking with the correct person using two identifiers. I discussed the limitations of evaluation and management by telemedicine and  the availability of in person appointments. Patient expressed understanding and agreed to proceed.  Chief Complaint: "I am doing well".  HPI: 28yo female with seizure disorder and headachesas well asmajor depressive disorder and generalized anxiety disorder.She hasbeen on 20 mg of citalopram withgood response and tolerability.Sleep adequate with trazodone which she now uses infrequently. Hydroxyzine prn anxiety also is effective and again she does not need it often. She managed to refrain from smoking marijuana for 10 months. She has been granted disability in late July and now has health insurance. Her mood has improved: She rates her mood at 2-3 on a scale 0-10 with 0 having no depression. Mild situational anxiety. She reports less frequent headaches (also hx of seizures) - she is on Trileptal and Topamax for the above.  Visit Diagnosis:    ICD-10-CM   1. GAD (generalized anxiety disorder)  F41.1   2. Cannabis use disorder, mild, in early remission  F12.11     Past Psychiatric History: Please see intake H&P.  Past Medical History:  Past Medical History:  Diagnosis Date  . Depression 04/29/2017  . Epilepsy (HCC)   . Migraine   . Seizures (HCC) 07/18/2016  . Sleep disturbance 04/29/2017   No past surgical history on file.  Family Psychiatric History: None.  Family History:  Family History  Problem Relation Age of Onset  . Seizures Other     Social History:  Social History   Socioeconomic History  . Marital status: Single    Spouse name: Not on file  . Number of children: 0  . Years of education: 12th  . 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 Financial resource strain: Not on file  . Food insecurity    Worry: Not on file    Inability: Not on file  . Transportation needs    Medical: Not on file    Non-medical: Not on file  Tobacco Use  . Smoking status: Current Every Day Smoker    Packs/day: 0.50    Types: Cigarettes  . Smokeless tobacco: Never Used  . Tobacco comment: 1/30 avg 2/day  Substance and Sexual Activity  . Alcohol use: No  . Drug use: Not Currently    Types: Marijuana    Comment: trying to quit  . Sexual activity: Yes  Lifestyle  . Physical activity    Days per week: Not on file    Minutes per session: Not on file  . Stress: Not on file  Relationships  . Social Musician on phone: Not on file    Gets together: Not on file    Attends religious service: Not on file    Active member of club or organization: Not on file    Attends meetings of clubs or organizations: Not on file    Relationship status: Not on file  Other Topics Concern  . Not on file  Social History Narrative   Patient lives in 2 story home with her mother.   High school education   Right handed     Allergies: No Known Allergies  Metabolic Disorder Labs: Lab Results  Component Value Date  HGBA1C 4.5 (L) 01/23/2017   No results found for: PROLACTIN No results found for: CHOL, TRIG, HDL, CHOLHDL, VLDL, LDLCALC Lab Results  Component Value Date   TSH 0.989 04/02/2019   TSH 1.180 01/23/2017    Therapeutic Level Labs: No results found for: LITHIUM No results found for: VALPROATE No components found for:  CBMZ  Current Medications: Current Outpatient Medications  Medication Sig Dispense Refill  . citalopram (CELEXA) 20 MG tablet Take 1 tablet (20 mg total) by mouth daily. 90 tablet 0  . cyclobenzaprine (FLEXERIL) 10 MG tablet Take 1 tablet (10 mg total) by mouth 3 (three) times daily as needed for muscle spasms. 30 tablet 3  . hydrOXYzine (ATARAX/VISTARIL) 25 MG tablet Take 1 tablet (25  mg total) by mouth 3 (three) times daily as needed for anxiety. 90 tablet 2  . ondansetron (ZOFRAN) 4 MG tablet Take 1 tablet (4 mg total) by mouth every 8 (eight) hours as needed for nausea or vomiting. 20 tablet 2  . oxcarbazepine (TRILEPTAL) 600 MG tablet TAKE 1 & 1/2 TABLETS BY MOUTH TWICE A DAY 90 tablet 11  . topiramate (TOPAMAX) 100 MG tablet Take 3 tablets (300 mg total) by mouth at bedtime. 90 tablet 11  . traZODone (DESYREL) 100 MG tablet Take 1 tablet (100 mg total) by mouth at bedtime as needed for sleep. 90 tablet 0   No current facility-administered medications for this visit.      Psychiatric Specialty Exam: Review of Systems  Musculoskeletal: Positive for myalgias.  Psychiatric/Behavioral: The patient is nervous/anxious.   All other systems reviewed and are negative.   There were no vitals taken for this visit.There is no height or weight on file to calculate BMI.  General Appearance: NA  Eye Contact:  NA  Speech:  Clear and Coherent and Normal Rate  Volume:  Normal  Mood:  Anxious  Affect:  NA  Thought Process:  Goal Directed and Linear  Orientation:  Full (Time, Place, and Person)  Thought Content: Logical   Suicidal Thoughts:  No  Homicidal Thoughts:  No  Memory:  Immediate;   Good Recent;   Good Remote;   Good  Judgement:  Good  Insight:  Good  Psychomotor Activity:  NA  Concentration:  Concentration: Good  Recall:  Good  Fund of Knowledge: Good  Language: Good  Akathisia:  Negative  Handed:  Right  AIMS (if indicated): not done  Assets:  Communication Skills Desire for Improvement Housing Resilience Social Support  ADL's:  Intact  Cognition: WNL  Sleep:  Good   Screenings: PHQ2-9     Office Visit from 04/02/2019 in [facility] Office Visit from 04/15/2017 in [facility] Visit from 01/28/2017 in [facility] Office Visit from 12/12/2016 in [facility] Office Visit  from 12/11/2016 in [facility]  PHQ-2 Total Score  0  6  6  0  0  PHQ-9 Total Score  -  19  22  -  -       Assessment and Plan: 28yo female with seizure disorder and headachesas well asmajor depressive disorder and generalized anxiety disorder.She hasbeen on 20 mg of citalopram withgood response and tolerability.Sleep adequate with trazodone which she now uses infrequently. Hydroxyzine prn anxiety also is effective and again she does not need it often. She managed to refrain from smoking marijuana for 10 months. She has been granted disability in late July and now  has health insurance. Her mood has improved: She rates her mood at 2-3 on a scale 0-10 with 0 having no depression. Mild situational anxiety. She reports less frequent headaches (also hx of seizures) - she is on Trileptal and Topamax for the above.  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 Next medication management visit in2 months.The plan was discussed with patient who had an opportunity to ask questions and these were all answered. I spend325minutes in phone consultation with thepatient.   Magdalene Patricialgierd A Najah Liverman, MD 05/17/2019, 2:40 PM

## 2019-05-20 ENCOUNTER — Telehealth (HOSPITAL_COMMUNITY): Payer: Self-pay

## 2019-05-20 NOTE — Telephone Encounter (Signed)
I have no idea - I doubt I offered her a treatment for headaches which are chronic and she is already on Topamax and Trileptal (I think). I did not mention any discussion of headache meds in my note. She really should contact a provider who treats her headaches not a psychiatrist.

## 2019-05-20 NOTE — Telephone Encounter (Signed)
Patient is calling, she said you had offered to give her something for her headaches and at the time she told you that it was fine, but now she would like to know what medication you were referring to. Patient has a headache that is not going away. Please review and advise, thank you

## 2019-05-25 ENCOUNTER — Telehealth: Payer: Self-pay | Admitting: Neurology

## 2019-05-25 ENCOUNTER — Telehealth: Payer: Self-pay | Admitting: Family Medicine

## 2019-05-25 ENCOUNTER — Ambulatory Visit: Payer: Self-pay | Admitting: Family Medicine

## 2019-05-25 ENCOUNTER — Other Ambulatory Visit: Payer: Self-pay

## 2019-05-25 ENCOUNTER — Ambulatory Visit (INDEPENDENT_AMBULATORY_CARE_PROVIDER_SITE_OTHER): Payer: Medicare HMO | Admitting: Neurology

## 2019-05-25 DIAGNOSIS — G43009 Migraine without aura, not intractable, without status migrainosus: Secondary | ICD-10-CM | POA: Diagnosis not present

## 2019-05-25 MED ORDER — KETOROLAC TROMETHAMINE 60 MG/2ML IM SOLN
60.0000 mg | Freq: Once | INTRAMUSCULAR | Status: AC
  Administered 2019-05-25: 60 mg via INTRAMUSCULAR

## 2019-05-25 NOTE — Telephone Encounter (Signed)
Called patient and she said she was waiting on a call back from her neurologist

## 2019-05-25 NOTE — Telephone Encounter (Signed)
Pt coming in this afternoon with her mom.

## 2019-05-25 NOTE — Telephone Encounter (Signed)
Patient called and requested a medication for a really bad headache. She was in tears because it hurt so badly she on the phone. She'd like to know if something can be sent into the pharmacy for her. She said her head has been hurting since last night.  [facility] on [address]

## 2019-05-25 NOTE — Telephone Encounter (Signed)
Pts HA started last night. Normally Tylenol or BC powder will help. Has some nausea this am but went away Pt drinks sodas daily. No alcohol or drug use. Pt does smoke. She does not take tylenol or BC powders that often. Mentioned coming in for HA cocktail. 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 Pt is fine with calling in medication as well.

## 2019-05-25 NOTE — Telephone Encounter (Signed)
Labs reviewed.  She can come in and get toradol, 60 mg IM, injection

## 2019-05-27 NOTE — Telephone Encounter (Signed)
Edith was given an injection by her Neurologist for the headache. She will see you on 06/22/2019 for an office visit.

## 2019-06-22 ENCOUNTER — Other Ambulatory Visit: Payer: Self-pay

## 2019-06-22 ENCOUNTER — Ambulatory Visit (INDEPENDENT_AMBULATORY_CARE_PROVIDER_SITE_OTHER): Payer: Medicare HMO | Admitting: Family Medicine

## 2019-06-22 ENCOUNTER — Encounter: Payer: Self-pay | Admitting: Family Medicine

## 2019-06-22 VITALS — BP 107/60 | HR 88 | Temp 97.9°F | Ht 67.0 in | Wt 160.2 lb

## 2019-06-22 DIAGNOSIS — L700 Acne vulgaris: Secondary | ICD-10-CM | POA: Diagnosis not present

## 2019-06-22 DIAGNOSIS — F411 Generalized anxiety disorder: Secondary | ICD-10-CM | POA: Diagnosis not present

## 2019-06-22 DIAGNOSIS — R569 Unspecified convulsions: Secondary | ICD-10-CM

## 2019-06-22 DIAGNOSIS — G479 Sleep disorder, unspecified: Secondary | ICD-10-CM

## 2019-06-22 DIAGNOSIS — Z09 Encounter for follow-up examination after completed treatment for conditions other than malignant neoplasm: Secondary | ICD-10-CM | POA: Diagnosis not present

## 2019-06-22 LAB — POCT URINALYSIS DIPSTICK
Bilirubin, UA: NEGATIVE
Blood, UA: NEGATIVE
Glucose, UA: NEGATIVE
Ketones, UA: NEGATIVE
Leukocytes, UA: NEGATIVE
Nitrite, UA: NEGATIVE
Protein, UA: NEGATIVE
Spec Grav, UA: 1.015 (ref 1.010–1.025)
Urobilinogen, UA: 0.2 E.U./dL
pH, UA: 7 (ref 5.0–8.0)

## 2019-06-22 MED ORDER — MINOCYCLINE HCL 75 MG PO CAPS: 75.0000 mg | ORAL_CAPSULE | Freq: Two times a day (BID) | ORAL | 0 refills | Status: DC

## 2019-06-22 NOTE — Progress Notes (Signed)
[facility]   Established Patient Office Visit  Subjective:  Patient ID: Lisa ChaJasmine D Flemmer, female    DOB: 10/10/1990  Age: 28 y.o. MRN: [id number]  CC:  Chief Complaint  Patient presents with  . Follow-up    1 month follow up     HPI Lisa Shaw is a 28 year old female who presents for Follow Up today.   Past Medical History:  Diagnosis Date  . Depression 04/29/2017  . Epilepsy (HCC)   . Migraine   . Seizures (HCC) 07/18/2016  . Sleep disturbance 04/29/2017   Current Status: Since she last office visit, she is doing well with no complaints.  She is currently following up with [facility]iatry [facility] as needed. Her anxiety is stable today. Her denies suicidal ideations, homicidal ideations, or auditory hallucinations. She denies fevers, chills, fatigue, recent infections, weight loss, and night sweats. She has not had any headaches, visual changes, dizziness, and falls. No chest pain, heart palpitations, cough and shortness of breath reported. No reports of GI problems such as nausea, vomiting, diarrhea, and constipation. She has no reports of blood in stools, dysuria and hematuria. She denies pain today.   History reviewed. No pertinent surgical history.  Family History  Problem Relation Age of Onset  . Seizures Other     Social History   Socioeconomic History  . Marital status: Single    Spouse name: Not on file  . Number of children: 0  . Years of education: 12th  . 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 Financial resource strain: Not on file  . Food insecurity    Worry: Not on file    Inability: Not on file  . Transportation needs    Medical: Not on file    Non-medical: Not on file  Tobacco Use  . Smoking status: Current Every Day Smoker    Packs/day: 0.50    Types: Cigarettes  . Smokeless tobacco: Never Used   . Tobacco comment: 1/30 avg 2/day  Substance and Sexual Activity  . Alcohol use: No  . Drug use: Not Currently    Types: Marijuana    Comment: trying to quit  . Sexual activity: Yes  Lifestyle  . Physical activity    Days per week: Not on file    Minutes per session: Not on file  . Stress: Not on file  Relationships  . Social Musicianconnections    Talks on phone: Not on file    Gets together: Not on file    Attends religious service: Not on file    Active member of club or organization: Not on file    Attends meetings of clubs or organizations: Not on file    Relationship status: Not on file  . Intimate partner violence    Fear of current or ex partner: Not on file    Emotionally abused: Not on file    Physically abused: Not on file    Forced sexual activity: Not on file  Other Topics Concern  . Not on file  Social History Narrative   Patient lives in 2 story home with her mother.   High school education   Right handed     Outpatient Medications Prior to Visit  Medication Sig Dispense Refill  . citalopram (CELEXA) 20 MG tablet Take 1 tablet (20 mg total) by mouth  daily. 90 tablet 0  . cyclobenzaprine (FLEXERIL) 10 MG tablet Take 1 tablet (10 mg total) by mouth 3 (three) times daily as needed for muscle spasms. 30 tablet 3  . hydrOXYzine (ATARAX/VISTARIL) 25 MG tablet Take 1 tablet (25 mg total) by mouth 3 (three) times daily as needed for anxiety. 90 tablet 2  . ondansetron (ZOFRAN) 4 MG tablet Take 1 tablet (4 mg total) by mouth every 8 (eight) hours as needed for nausea or vomiting. 20 tablet 2  . oxcarbazepine (TRILEPTAL) 600 MG tablet TAKE 1 & 1/2 TABLETS BY MOUTH TWICE A DAY 90 tablet 11  . topiramate (TOPAMAX) 100 MG tablet Take 3 tablets (300 mg total) by mouth at bedtime. 90 tablet 11  . traZODone (DESYREL) 100 MG tablet Take 1 tablet (100 mg total) by mouth at bedtime as needed for sleep. 90 tablet 0   No facility-administered medications prior to visit.     No Known  Allergies  ROS Review of Systems  Constitutional: Negative.   HENT: Negative.   Eyes: Negative.   Respiratory: Negative.   Cardiovascular: Negative.   Gastrointestinal: Negative.   Endocrine: Negative.   Genitourinary: Negative.   Musculoskeletal: Positive for arthralgias.  Skin: Negative.   Allergic/Immunologic: Negative.   Neurological: Positive for seizures (occasional ) and headaches (occasional ).  Hematological: Negative.   Psychiatric/Behavioral: Negative.       Objective:    Physical Exam  Constitutional: She is oriented to person, place, and time. She appears well-developed and well-nourished.  HENT:  Head: Normocephalic and atraumatic.  Eyes: Conjunctivae are normal.  Neck: Normal range of motion. Neck supple.  Cardiovascular: Normal rate, normal heart sounds and intact distal pulses.  Pulmonary/Chest: Effort normal and breath sounds normal.  Abdominal: Soft. Bowel sounds are normal.  Musculoskeletal: Normal range of motion.  Neurological: She is alert and oriented to person, place, and time. She has normal reflexes.  Skin: Skin is warm and dry.  Psychiatric: She has a normal mood and affect. Her behavior is normal. Judgment and thought content normal.  Nursing note and vitals reviewed.   BP 107/60   Pulse 88   Temp 97.9 F (36.6 C) (Oral)   Ht 5\' 7"  (1.702 m)   Wt 160 lb 3.2 oz (72.7 kg)   LMP 06/07/2019   SpO2 99%   BMI 25.09 kg/m  Wt Readings from Last 3 Encounters:  06/22/19 160 lb 3.2 oz (72.7 kg)  04/02/19 148 lb 12.8 oz (67.5 kg)  03/19/19 135 lb (61.2 kg)     There are no preventive care reminders to display for this patient.  There are no preventive care reminders to display for this patient.  Lab Results  Component Value Date   TSH 0.989 04/02/2019   Lab Results  Component Value Date   WBC 5.4 04/02/2019   HGB 13.7 04/02/2019   HCT 41.7 04/02/2019   MCV 100 (H) 04/02/2019   PLT 313 04/02/2019   Lab Results  Component Value  Date   NA 136 04/02/2019   K 4.0 04/02/2019   CO2 21 04/02/2019   GLUCOSE 55 (L) 04/02/2019   BUN 10 04/02/2019   CREATININE 0.78 04/02/2019   BILITOT 0.2 04/02/2019   ALKPHOS 89 04/02/2019   AST 16 04/02/2019   ALT 15 04/02/2019   PROT 7.0 04/02/2019   ALBUMIN 4.5 04/02/2019   CALCIUM 9.8 04/02/2019   ANIONGAP 9 05/14/2018   No results found for: CHOL No results found for: HDL No results  found for: LDLCALC No results found for: TRIG No results found for: [facility] Lab Results  Component Value Date   HGBA1C 4.5 (L) 01/23/2017      Assessment & Plan:   1. Seizures (HCC) Stable. No recent seizures.  2. Sleep disturbance  3. GAD (generalized anxiety disorder)  4. Acne vulgaris We will initiate Minocin today.  - minocycline (MINOCIN) 75 MG capsule; Take 1 capsule (75 mg total) by mouth 2 (two) times daily for 10 days.  Dispense: 20 capsule; Refill: 0  5. Follow up She will follow up in 6 months.  - Urinalysis Dipstick  Meds ordered this encounter  Medications  . minocycline (MINOCIN) 75 MG capsule    Sig: Take 1 capsule (75 mg total) by mouth 2 (two) times daily for 10 days.    Dispense:  20 capsule    Refill:  0    Orders Placed This Encounter  Procedures  . Urinalysis Dipstick    Referral Orders  No referral(s) requested today   Kathe Becton,  MSN, FNP-BC [address], [place] [id number] [phone]- fax   Problem List Items Addressed This Visit      Other   GAD (generalized anxiety disorder)   Seizures ([place]) - Primary   Sleep disturbance    Other Visit Diagnoses    Acne vulgaris       Relevant Medications   minocycline (MINOCIN) 75 MG capsule   Follow up       Relevant Orders   Urinalysis Dipstick (Completed)      Meds ordered this encounter  Medications  . minocycline (MINOCIN) 75 MG capsule    Sig: Take 1 capsule (75 mg total)  by mouth 2 (two) times daily for 10 days.    Dispense:  20 capsule    Refill:  0    Follow-up: Return in about 6 months (around 12/21/2019).    Azzie Glatter, FNP

## 2019-06-22 NOTE — Patient Instructions (Signed)
Minocycline tablets or capsules What is this medicine? MINOCYCLINE (mi noe SYE kleen) is a tetracycline antibiotic. It is used to treat certain kinds of bacterial infections. It will not work for colds, flu, or other viral infections. This medicine may be used for other purposes; ask your health care provider or pharmacist if you have questions. COMMON BRAND NAME(S): Cleeravue, Dynacin, Minocin, Minocin Kit, Minocin PAC, Myrac What should I tell my health care provider before I take this medicine? They need to know if you have any of these conditions:  kidney disease  liver disease  an unusual or allergic reaction to minocycline, tetracycline antibiotics, other medicines, foods, dyes, or preservatives  pregnant or trying to get pregnant  breast-feeding How should I use this medicine? Take this medicine by mouth with a full glass of water. Follow the directions on the prescription label. You can take it with or without food. If it upsets your stomach, take it with food. Take your medicine at regular intervals. Do not take your medicine more often than directed. Take all of your medicine as directed even if you think you are better. Do not skip doses or stop your medicine early. Talk to your pediatrician regarding the use of this medicine in children. While this drug may be prescribed for children as young as 8 years for selected conditions, precautions do apply. Overdosage: If you think you have taken too much of this medicine contact a poison control center or emergency room at once. NOTE: This medicine is only for you. Do not share this medicine with others. What if I miss a dose? If you miss a dose, take it as soon as you can. If it is almost time for your next dose, take only that dose. Do not take double or extra doses. What may interact with this medicine? Do not take this medicine with any of the following medications:  acitretin This medicine may also interact with the following  medications:  antacids  birth control pills  certain medicines that treat or prevent blood clots like warfarin  ergot alkaloids like dihydroergotamine, ergonovine, ergotamine, methylergonovine  iron supplements  isotretinoin  methoxyflurane  other antibiotics like penicillin This list may not describe all possible interactions. Give your health care provider a list of all the medicines, herbs, non-prescription drugs, or dietary supplements you use. Also tell them if you smoke, drink alcohol, or use illegal drugs. Some items may interact with your medicine. What should I watch for while using this medicine? Tell your doctor or health care provider if your symptoms do not start to get better or if they get worse. This medicine may cause serious skin reactions. They can happen weeks to months after starting the medicine. Contact your health care provider right away if you notice fevers or flu-like symptoms with a rash. The rash may be red or purple and then turn into blisters or peeling of the skin. Or, you might notice a red rash with swelling of the face, lips or lymph nodes in your neck or under your arms. Do not treat diarrhea with over the counter products. Contact your doctor if you have diarrhea that lasts more than 2 days or if it is severe and watery. This medicine can make you more sensitive to the sun. Keep out of the sun. If you cannot avoid being in the sun, wear protective clothing and use sunscreen. Do not use sun lamps or tanning beds/booths. Tell your doctor or health care provider right away if you  have any change in your eyesight. Birth control pills may not work properly while you are taking this medicine. Talk to your doctor about using an extra method of birth control. You may get drowsy or dizzy. Do not drive, use machinery, or do anything that needs mental alertness until you know how this medicine affects you. Do not stand or sit up quickly, especially if you are an  older patient. This reduces the risk of dizzy or fainting spells. If you are being treated for a sexually transmitted infection, avoid sexual contact until you have finished your treatment. Your sexual partner may also need treatment. What side effects may I notice from receiving this medicine? Side effects that you should report to your doctor or health care professional as soon as possible:  allergic reactions like skin rash, itching or hives, swelling of the face, lips, or tongue  bloody or watery diarrhea  changes in vision  rash, fever, and swollen lymph nodes  redness, blistering, peeling or loosening of the skin, including inside the mouth  seizures  signs and symptoms of liver injury like dark yellow or brown urine; general ill feeling or flu-like symptoms; light-colored stools; loss of appetite; nausea; right upper belly pain; unusually weak or tired; yellowing of the eyes or skin  trouble passing urine or change in the amount of urine  trouble swallowing Side effects that usually do not require medical attention (report to your doctor or health care professional if they continue or are bothersome):  decreased hearing or ringing in the ears  diarrhea  dizziness  headache  loss of appetite  nausea, vomiting This list may not describe all possible side effects. Call your doctor for medical advice about side effects. You may report side effects to FDA at 1-800-FDA-1088. Where should I keep my medicine? Keep out of the reach of children. Store at room temperature between 20 and 25 degrees C (68 and 77 degrees F). Throw away any unused medicine after the expiration date. NOTE: This sheet is a summary. It may not cover all possible information. If you have questions about this medicine, talk to your doctor, pharmacist, or health care provider.  2020 Elsevier/Gold Standard (2018-10-07 09:52:46) Acne  Acne is a skin problem that causes small, red bumps (pimples) and other  skin changes. The skin has tiny holes called pores. Each pore has an oil gland. Acne happens when the pores get blocked. The pores may become red, sore, and swollen. They may also become infected. Acne is common among teenagers. Acne usually goes away over time. What are the causes? This condition may be caused when:  Oil glands get blocked by oil, dead skin cells, and dirt.  Bacteria that live in the oil glands increase in number and cause infection. Acne can start with changes in hormones. These changes can occur:  When children mature into their teens (adolescence).  When women get their period (menstrual cycle).  When women are pregnant. Some things can make acne worse. They include:  Cosmetics and hair products that have oil in them.  Stress.  Diseases that cause changes in hormones.  Some medicines.  Headbands, backpacks, or shoulder pads.  Being near certain oils and chemicals.  Foods that are high in sugars. These include dairy products, sweets, and chocolates. What increases the risk? You are more likely to develop this condition if:  You are a teenager.  You have a family history of acne. What are the signs or symptoms? Symptoms of this condition  include:  Small, red bumps (pimples or papules).  Whiteheads.  Blackheads.  Small, pus-filled pimples (pustules).  Big, red pimples or pustules that feel tender. Acne that is very bad can cause:  An abscess. This is an area that has pus.  Cysts. These are hard, painful sacs that have fluid.  Scars. These can happen after large pimples heal. How is this treated? Treatment for this condition depends on how bad your acne is. It may include:  Creams and lotions. These can: ? Keep the pores of your skin open. ? Prevent infections and swelling.  Medicines that treat infections (antibiotics). These can be put on your skin or taken as pills.  Pills that decrease the amount of oil in your skin.  Birth control  pills.  Light or laser treatments.  Shots of medicine into the areas with acne.  Chemicals that cause the skin to peel.  Surgery. Follow these instructions at home: Good skin care is the most important thing you can do to treat your acne. Take care of your skin as told by your doctor. You may be told to do these things:  Wash your skin gently at least two times each day. You should also wash your skin: ? After you exercise. ? Before you go to bed.  Use mild soap.  Use a water-based skin moisturizer after you wash your skin.  Use a sunscreen or sunblock with SPF 30 or greater. This is very important if you are using acne medicines.  Choose cosmetics that will not block your oil glands (are noncomedogenic). Medicines  Take over-the-counter and prescription medicines only as told by your doctor.  If you were prescribed an antibiotic medicine, use it or take it as told by your doctor. Do not stop using the antibiotic even if your acne gets better. General instructions  Keep your hair clean and off your face. Shampoo your hair on a regular basis. If you have oily hair, you may need to wash it every day.  Avoid wearing tight headbands or hats.  Avoid picking or squeezing your pimples. That can make your acne worse and cause it to scar.  Shave gently. Only shave when you have to.  Keep a food journal. This can help you see if any foods are linked to your acne.  Keep all follow-up visits as told by your doctor. This is important. Contact a doctor if:  Your acne is not better after eight weeks.  Your acne gets worse.  You have a large area of skin that is red or tender.  You think that you are having side effects from any acne medicine. Summary  Acne is a skin problem that causes pimples. Acne is common among teenagers. Acne usually goes away over time.  Acne starts with changes in your hormones. Other causes include stress, diet, and some medicines.  Follow your  doctor's instructions on how to take care of your skin. Good skin care is the most important thing you can do to treat your acne.  Take over-the-counter and prescription medicines only as told by your doctor.  Contact your doctor if you think that you are having side effects from any acne medicine. This information is not intended to replace advice given to you by your health care provider. Make sure you discuss any questions you have with your health care provider. Document Released: 06/20/2011 Document Revised: 11/11/2017 Document Reviewed: 11/11/2017 Elsevier Patient Education  2020 [employer].

## 2019-06-23 ENCOUNTER — Encounter: Payer: Self-pay | Admitting: Family Medicine

## 2019-06-23 DIAGNOSIS — L700 Acne vulgaris: Secondary | ICD-10-CM | POA: Insufficient documentation

## 2019-07-01 ENCOUNTER — Telehealth (HOSPITAL_COMMUNITY): Payer: Self-pay

## 2019-07-01 ENCOUNTER — Other Ambulatory Visit (HOSPITAL_COMMUNITY): Payer: Self-pay | Admitting: Psychiatry

## 2019-07-01 NOTE — Telephone Encounter (Signed)
Medication refill request - Fax received from pt's [facility] for a refill of her Trazodone which was discontinued on 06/22/19 by Kathe Becton, FNP. Not identified in her note why medication was discontinued but was continued by Dr. Montel Culver at patient's last evaluation with him 05/17/19.

## 2019-07-01 NOTE — Telephone Encounter (Signed)
My guess is that trazodone was discontinued because of a relatively high risk of it causing headaches and she has been dealing with these lately. I would not restart trazodone unless patient is requesting this (not pharmacy).

## 2019-07-19 ENCOUNTER — Other Ambulatory Visit: Payer: Self-pay

## 2019-07-19 ENCOUNTER — Ambulatory Visit (HOSPITAL_COMMUNITY): Payer: Medicare Other | Admitting: Psychiatry

## 2019-08-06 ENCOUNTER — Ambulatory Visit (INDEPENDENT_AMBULATORY_CARE_PROVIDER_SITE_OTHER): Payer: Medicare HMO | Admitting: Psychiatry

## 2019-08-06 ENCOUNTER — Other Ambulatory Visit: Payer: Self-pay

## 2019-08-06 DIAGNOSIS — F3341 Major depressive disorder, recurrent, in partial remission: Secondary | ICD-10-CM

## 2019-08-06 DIAGNOSIS — F1211 Cannabis abuse, in remission: Secondary | ICD-10-CM

## 2019-08-06 DIAGNOSIS — F411 Generalized anxiety disorder: Secondary | ICD-10-CM

## 2019-08-06 MED ORDER — CITALOPRAM HYDROBROMIDE 20 MG PO TABS: 20.0000 mg | ORAL_TABLET | Freq: Every day | ORAL | 1 refills | Status: DC

## 2019-08-06 MED ORDER — HYDROXYZINE HCL 25 MG PO TABS: 25.0000 mg | ORAL_TABLET | Freq: Three times a day (TID) | ORAL | 5 refills | Status: AC | PRN

## 2019-08-06 NOTE — Progress Notes (Signed)
BH MD/PA/NP OP Progress Note  08/06/2019 11:44 AM Lisa Shaw  MRN:  [id number] Interview was conducted by phone and I verified that I was speaking with the correct person using two identifiers. I discussed the limitations of evaluation and management by telemedicine and  the availability of in person appointments. Patient expressed understanding and agreed to proceed.  Chief Complaint: Anxiety, forgetfulness.  HPI: 29yo female with seizure disorder and headachesas well asmajor depressive disorder and generalized anxiety disorder.She hasbeen on 20 mg of citalopram withgood response and tolerability.Sleep adequate with trazodone which she now uses infrequently. Hydroxyzine prn anxiety also is effective and again she does not need it often. She managed to refrain from smoking marijuana fora year now. She has been granted disability in late July and now has health insurance. Her mood has improved: Mild situational anxiety and practically no depression. She reports less frequent headaches (also hx of seizures) - she is on Trileptal and Topamax for the above. She complains of some forgetfulness (Topamax?)   Visit Diagnosis:    ICD-10-CM   1. Major depressive disorder, recurrent episode, in partial remission (HCC)  F33.41   2. GAD (generalized anxiety disorder)  F41.1   3. Cannabis use disorder, mild, in sustained remission  F12.11     Past Psychiatric History: Please see intake H&P.  Past Medical History:  Past Medical History:  Diagnosis Date  . Acne vulgaris   . Depression 04/29/2017  . Epilepsy (HCC)   . Migraine   . Seizures (HCC) 07/18/2016  . Sleep disturbance 04/29/2017   No past surgical history on file.  Family Psychiatric History: None.  Family History:  Family History  Problem Relation Age of Onset  . Seizures Other     Social History:  Social History   Socioeconomic History  . Marital status: Single    Spouse name: Not on file  . Number of children:  0  . Years of education: 12th  . Highest education level: Not on file  Occupational History    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[employer]  Tobacco Use  . Smoking status: Current Every Day Smoker    Packs/day: 0.50    Types: Cigarettes  . Smokeless tobacco: Never Used  . Tobacco comment: 1/30 avg 2/day  Substance and Sexual Activity  . Alcohol use: No  . Drug use: Not Currently    Types: Marijuana    Comment: trying to quit  . Sexual activity: Yes  Other Topics Concern  . Not on file  Social History Narrative   Patient lives in 2 story home with her mother.   High school education   Right handed    Social Determinants of Health   Financial Resource Strain:   . Difficulty of Paying Living Expenses: Not on file  Food Insecurity:   . Worried About Programme researcher, broadcasting/film/video in the Last Year: Not on file  . Ran Out of Food in the Last Year: Not on file  Transportation Needs:   . Lack of Transportation (Medical): Not on file  . Lack of Transportation (Non-Medical): Not on file  Physical Activity:   . Days of Exercise per Week: Not on file  . Minutes of Exercise per Session: Not on file  Stress:   . Feeling of Stress : Not on file  Social Connections:   . Frequency of Communication with Friends and Family: Not on file  . Frequency of Social Gatherings with Friends and Family: Not on file  . Attends Religious  Services: Not on file  . Active Member of Clubs or Organizations: Not on file  . Attends Archivist Meetings: Not on file  . Marital Status: Not on file    Allergies: No Known Allergies  Metabolic Disorder Labs: Lab Results  Component Value Date   HGBA1C 4.5 (L) 01/23/2017   No results found for: PROLACTIN No results found for: CHOL, TRIG, HDL, CHOLHDL, VLDL, LDLCALC Lab Results  Component Value Date   TSH 0.989 04/02/2019   TSH 1.180 01/23/2017    Therapeutic Level Labs: No results found for: LITHIUM No results found for: VALPROATE No components found for:   CBMZ  Current Medications: Current Outpatient Medications  Medication Sig Dispense Refill  . citalopram (CELEXA) 20 MG tablet Take 1 tablet (20 mg total) by mouth daily. 90 tablet 1  . cyclobenzaprine (FLEXERIL) 10 MG tablet Take 1 tablet (10 mg total) by mouth 3 (three) times daily as needed for muscle spasms. 30 tablet 3  . hydrOXYzine (ATARAX/VISTARIL) 25 MG tablet Take 1 tablet (25 mg total) by mouth 3 (three) times daily as needed for anxiety. 90 tablet 5  . ondansetron (ZOFRAN) 4 MG tablet Take 1 tablet (4 mg total) by mouth every 8 (eight) hours as needed for nausea or vomiting. 20 tablet 2  . oxcarbazepine (TRILEPTAL) 600 MG tablet TAKE 1 & 1/2 TABLETS BY MOUTH TWICE A DAY 90 tablet 11  . topiramate (TOPAMAX) 100 MG tablet Take 3 tablets (300 mg total) by mouth at bedtime. 90 tablet 11   No current facility-administered medications for this visit.      Psychiatric Specialty Exam: Review of Systems  Psychiatric/Behavioral: The patient is nervous/anxious.   All other systems reviewed and are negative.   There were no vitals taken for this visit.There is no height or weight on file to calculate BMI.  General Appearance: NA  Eye Contact:  NA  Speech:  Clear and Coherent and Slow  Volume:  Normal  Mood:  Anxious  Affect:  NA  Thought Process:  Goal Directed and Linear  Orientation:  Full (Time, Place, and Person)  Thought Content: Logical   Suicidal Thoughts:  No  Homicidal Thoughts:  No  Memory:  Immediate;   Fair Recent;   Good Remote;   Good  Judgement:  Good  Insight:  Good  Psychomotor Activity:  NA  Concentration:  Concentration: Good  Recall:  [place] of Knowledge: Good  Language: Good  Akathisia:  Negative  Handed:  Right  AIMS (if indicated): not done  Assets:  Communication Skills Desire for Improvement Financial Resources/Insurance Housing Social Support  ADL's:  Intact  Cognition: WNL  Sleep:  Good   Screenings: PHQ2-9     Office Visit from  06/22/2019 in [place] Office Visit from 04/02/2019 in [place] Office Visit from 04/15/2017 in [place] Office Visit from 01/28/2017 in [place] Office Visit from 12/12/2016 in [place]  PHQ-2 Total Score  0  0  6  6  0  PHQ-9 Total Score  -  -  19  22  -       Assessment and Plan: 29yo female with seizure disorder and headachesas well asmajor depressive disorder and generalized anxiety disorder.She hasbeen on 20 mg of citalopram withgood response and tolerability.Sleep adequate with trazodone which she now uses infrequently. Hydroxyzine prn anxiety also is effective and again she does not need  it often. She managed to refrain from smoking marijuana fora year now. She has been granted disability in late July and now has health insurance. Her mood has improved: Mild situational anxiety and practically no depression. She reports less frequent headaches (also hx of seizures) - she is on Trileptal and Topamax for the above. She complains of some forgetfulness (Topamax?)  Dx: GAD, MDD recurrent in partial remission  Plan: Continue citalopram and hydroxyzine unchanged. Next medication management visit in3 months.The plan was discussed with patient who had an opportunity to ask questions and these were all answered. I spend60minutes in phoneconsultationwith thepatient.    Magdalene Patricia, MD 08/06/2019, 11:44 AM

## 2019-08-12 ENCOUNTER — Other Ambulatory Visit: Payer: Self-pay | Admitting: Family Medicine

## 2019-08-12 DIAGNOSIS — L700 Acne vulgaris: Secondary | ICD-10-CM

## 2019-08-12 NOTE — Telephone Encounter (Signed)
Acne vulgaris. Patient has requested a refill of Minocycline.

## 2019-08-13 ENCOUNTER — Telehealth: Payer: Self-pay

## 2019-08-13 ENCOUNTER — Other Ambulatory Visit: Payer: Self-pay | Admitting: Family Medicine

## 2019-08-13 DIAGNOSIS — L700 Acne vulgaris: Secondary | ICD-10-CM

## 2019-08-13 NOTE — Telephone Encounter (Signed)
Information faxed to [facility]. Patient informed.

## 2019-08-24 DIAGNOSIS — L700 Acne vulgaris: Secondary | ICD-10-CM | POA: Diagnosis not present

## 2019-10-14 ENCOUNTER — Other Ambulatory Visit: Payer: Self-pay

## 2019-10-14 ENCOUNTER — Telehealth (INDEPENDENT_AMBULATORY_CARE_PROVIDER_SITE_OTHER): Payer: Medicare HMO | Admitting: Neurology

## 2019-10-14 ENCOUNTER — Encounter: Payer: Self-pay | Admitting: Neurology

## 2019-10-14 DIAGNOSIS — G43009 Migraine without aura, not intractable, without status migrainosus: Secondary | ICD-10-CM

## 2019-10-14 DIAGNOSIS — G40009 Localization-related (focal) (partial) idiopathic epilepsy and epileptic syndromes with seizures of localized onset, not intractable, without status epilepticus: Secondary | ICD-10-CM | POA: Diagnosis not present

## 2019-10-14 NOTE — Progress Notes (Signed)
Virtual Visit via Video Note The purpose of this virtual visit is to provide medical care while limiting exposure to the novel coronavirus.    Consent was obtained for video visit:  Yes.   Answered questions that patient had about telehealth interaction:  Yes.   I discussed the limitations, risks, security and privacy concerns of performing an evaluation and management service by telemedicine. I also discussed with the patient that there may be a patient responsible charge related to this service. The patient expressed understanding and agreed to proceed.  Pt location: Home Physician Location: office Name of referring provider:  Mack Hook, MD I connected with Kathaleen Bury Digirolamo at patients initiation/request on 10/14/2019 at  8:30 AM EDT by video enabled telemedicine application and verified that I am speaking with the correct person using two identifiers. Pt MRN:  [id number] Pt DOB:  [birth year] Video Participants:  Regenia Skeeter   History of Present Illness:  The patient had a virtual video visit on 10/14/2019. 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 Prior EEG reported left frontal sharp waves. Since her last visit, she continues to deny any seizures since June 2020. She has been taking oxcarbazepine 600mg  1.5 tabs only at bedtime (instead of BID) and Topiramate 300mg  qhs without side effects. She went 2 days without oxcarbazepine and noticed her hands were tingling the next day, no progression of symptoms. She reports headaches are well-controlled with the Topiramate. She denies any dizziness, vision changes, focal numbness/tingling/weakness, myoclonic jerks. Sleep is good. No falls. Mood is stiff iffy, she is a little more animated today compared to prior visits.    History on Initial Assessment 12/02/2017: This is a pleasant 29 year old right-handed woman with a history of seizures since 2013.  Records from [facility] and [facility] were reviewed and will be summarized here, she is depressed in the office today and is a poor historian. She initially had nocturnal seizures with described diffuse rhythmic jerking with eye rolling backwards. She had an EEG in 06/2012 reporting left frontal sharps. There is also note of episodes in 2012 where she would have staring spells lasting around 2 minutes with associated diaphoresis and post-ictal confusion with violence usually at night or early in the morning. Both seizure types were associated with tongue bite and urinary incontinence. There is report of deja vu, and she reports tingling in both hands during the "stare offs." She lives with her mother but is mostly by herself at home during the day, and feels she has the "stare off" episodes around twice a week. She was initially started on Keppra which caused mood issues, then switched to Topamax. It appears she initially had mental slowing on Topamax, but has been taking 300mg  qhs since 2015. She had seen Dr. Leta Baptist at [facility] last year and was started on Vimpat which she stopped again due to side effects. She reports the last nocturnal convulsion was in June 2018 when she woke up feeling weird with her tongue sore and with a headache. She was given a headache cocktail but was also noted to be very anxious and depressed. She was admitted for involuntary commitment at [facility] in July 2018 for suicidal ideation. She currently denies any suicidal ideation but continues to report significant depression. She reports body twitches in her arms or legs. She has headaches on a daily basis, taking an average of 2 Tylenol or BC powder daily. She feels it  is due to "overthinking or depression." She has sleep difficulties with interrupted 5-6 hours of sleep at night. She feels fatigued and drowsy during the day. She has dizziness when standing. Vision is occasionally blurred when watching TV. She has noticed swallowing  difficulties. She notices her right foot gets numb frequently and her balance is off. No falls. 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   Epilepsy Risk Factors:  Her maternal great grandmother and great aunt had seizures. Otherwise she had a normal birth and early development.  There is no history of febrile convulsions, CNS infections such as meningitis/encephalitis, significant traumatic brain injury, neurosurgical procedures.  Prior AEDs: Keppra, Vimpat Laboratory Data:  EEGs: EEG in 06/2012 reported as abnormal secondary to focal sharp wave activity in the left frontal region MRI: I personally reviewed MRI brain without contrast done 06/2012 which was degraded by motion. Lateral ventricles are mildly prominent in size with a slightly rounded appearance, no transependymal CSF flow. Hippocampi appear symmetric with no abnormal signal seen.    Current Outpatient Medications on File Prior to Visit  Medication Sig Dispense Refill  . citalopram (CELEXA) 20 MG tablet Take 1 tablet (20 mg total) by mouth daily. 90 tablet 1  . cyclobenzaprine (FLEXERIL) 10 MG tablet Take 1 tablet (10 mg total) by mouth 3 (three) times daily as needed for muscle spasms. 30 tablet 3  . hydrOXYzine (ATARAX/VISTARIL) 25 MG tablet Take 1 tablet (25 mg total) by mouth 3 (three) times daily as needed for anxiety. 90 tablet 5  . ondansetron (ZOFRAN) 4 MG tablet Take 1 tablet (4 mg total) by mouth every 8 (eight) hours as needed for nausea or vomiting. 20 tablet 2  . oxcarbazepine (TRILEPTAL) 600 MG tablet TAKE 1 & 1/2 TABLETS BY MOUTH TWICE A DAY 90 tablet 11  . topiramate (TOPAMAX) 100 MG tablet Take 3 tablets (300 mg total) by mouth at bedtime. 90 tablet 11   No current facility-administered medications on file prior to visit.     Observations/Objective:   GEN:  The patient appears stated age and is in NAD. Neurological examination: Patient is  awake, alert, oriented x 3. No aphasia or dysarthria. Intact fluency and comprehension. Remote and recent memory intact. Cranial nerves: Extraocular movements intact with no nystagmus. No facial asymmetry. Motor: moves all extremities symmetrically, at least anti-gravity x 4. No incoordination on finger to nose testing. Gait: narrow-based and steady, able to tandem walk adequately. Negative Romberg test.   Assessment and Plan:   This is a pleasant 29 yo RH woman with a history of depression, migraines, and seizures. 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 Prior EEG in 2013 reported left frontal sharp waves, MRI degraded by motion but no acute changes seen. She denies any seizures since June 2020. She has been taking oxcarbazepine 600mg  1.5 tabs only qhs (instead of BID) and Topiramate 300mg  qhs. Since she has been doing well, we have agreed to continue on current regimen. Migraines well-controlled as well. She is aware of [place] driving laws to stop driving until 6 months seizure-free. Continue follow-up with Behavioral Health. Follow-up in 6 months, she knows to call for any changes.   Follow Up Instructions:   -I discussed the assessment and treatment plan with the patient. The patient was provided an opportunity to ask questions and all were answered. The patient agreed with the plan and demonstrated  an understanding of the instructions.   The patient was advised to call back or seek an in-person evaluation if the symptoms worsen or if the condition fails to improve as anticipated.    Van Clines, MD

## 2019-10-24 MED ORDER — OXCARBAZEPINE 600 MG PO TABS: ORAL_TABLET | ORAL | 11 refills | Status: DC

## 2019-10-24 MED ORDER — TOPIRAMATE 100 MG PO TABS: 300.0000 mg | ORAL_TABLET | Freq: Every day | ORAL | 11 refills | Status: DC

## 2019-10-25 ENCOUNTER — Ambulatory Visit: Payer: Medicare HMO | Admitting: Gastroenterology

## 2019-11-04 ENCOUNTER — Other Ambulatory Visit: Payer: Self-pay

## 2019-11-04 ENCOUNTER — Telehealth (INDEPENDENT_AMBULATORY_CARE_PROVIDER_SITE_OTHER): Payer: Medicare HMO | Admitting: Psychiatry

## 2019-11-04 DIAGNOSIS — F3341 Major depressive disorder, recurrent, in partial remission: Secondary | ICD-10-CM

## 2019-11-04 DIAGNOSIS — F411 Generalized anxiety disorder: Secondary | ICD-10-CM | POA: Diagnosis not present

## 2019-11-04 NOTE — Progress Notes (Signed)
BH MD/PA/NP OP Progress Note  11/04/2019 8:37 AM Lisa Shaw  MRN:  [id number] Interview was conducted by phone and I verified that I was speaking with the correct person using two identifiers. I discussed the limitations of evaluation and management by telemedicine and  the availability of in person appointments. Patient expressed understanding and agreed to proceed.  Chief Complaint: "I am doing well".  HPI: 29yo female with seizure disorder and headachesas well asmajor depressive disorder and generalized anxiety disorder.She hasbeen on 20mg  of citalopramwithgood response and tolerability.Sleep adequate without trazodone.Hydroxyzine prn anxiety also is effectiveand again she does not need it often. She managed to refrain from smoking marijuana fora year now.She has been granted disability in late July and now has health insurance. Her mood has improved:Mild situational anxiety and practically no depression. She reportsless frequentheadaches(also hx of seizures)- she is on Trileptal and Topamax for the above.  Visit Diagnosis:    ICD-10-CM   1. GAD (generalized anxiety disorder)  F41.1   2. Major depressive disorder, recurrent episode, in partial remission (HCC)  F33.41     Past Psychiatric History: Please see intake H&P.  Past Medical History:  Past Medical History:  Diagnosis Date  . Acne vulgaris   . Depression 04/29/2017  . Epilepsy (HCC)   . Migraine   . Seizures (HCC) 07/18/2016  . Sleep disturbance 04/29/2017   No past surgical history on file.  Family Psychiatric History: None.  Family History:  Family History  Problem Relation Age of Onset  . Seizures Other     Social History:  Social History   Socioeconomic History  . Marital status: Single    Spouse name: Not on file  . Number of children: 0  . Years of education: 12th  . 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 Smoking status: Current Every Day Smoker    Packs/day: 0.50    Types: Cigarettes  . Smokeless tobacco: Never Used  . Tobacco comment: 1/30 avg 2/day  Substance and Sexual Activity  . Alcohol use: No  . Drug use: Not Currently    Types: Marijuana    Comment: trying to quit  . Sexual activity: Yes  Other Topics Concern  . Not on file  Social History Narrative   Patient lives in 2 story home with her mother.   High school education   Right handed    Social Determinants of Health   Financial Resource Strain:   . Difficulty of Paying Living Expenses:   Food Insecurity:   . Worried About 2/30 in the Last Year:   . Programme researcher, broadcasting/film/video in the Last Year:   Transportation Needs:   . Barista (Medical):   Freight forwarder Lack of Transportation (Non-Medical):   Physical Activity:   . Days of Exercise per Week:   . Minutes of Exercise per Session:   Stress:   . Feeling of Stress :   Social Connections:   . Frequency of Communication with Friends and Family:   . Frequency of Social Gatherings with Friends and Family:   . Attends Religious Services:   . Active Member of Clubs or Organizations:   . Attends [employer] [employer] Meetings:   Banker Marital Status:     Allergies: No Known Allergies  Metabolic Disorder Labs: Lab Results  Component Value Date   HGBA1C 4.5 (L) 01/23/2017   No results found  for: PROLACTIN No results found for: CHOL, TRIG, HDL, CHOLHDL, VLDL, LDLCALC Lab Results  Component Value Date   TSH 0.989 04/02/2019   TSH 1.180 01/23/2017    Therapeutic Level Labs: No results found for: LITHIUM No results found for: VALPROATE No components found for:  CBMZ  Current Medications: Current Outpatient Medications  Medication Sig Dispense Refill  . citalopram (CELEXA) 20 MG tablet Take 1 tablet (20 mg total) by mouth daily. 90 tablet 1  . cyclobenzaprine (FLEXERIL) 10 MG tablet Take 1 tablet (10 mg total) by mouth 3 (three) times daily as needed  for muscle spasms. 30 tablet 3  . hydrOXYzine (ATARAX/VISTARIL) 25 MG tablet Take 1 tablet (25 mg total) by mouth 3 (three) times daily as needed for anxiety. 90 tablet 5  . ondansetron (ZOFRAN) 4 MG tablet Take 1 tablet (4 mg total) by mouth every 8 (eight) hours as needed for nausea or vomiting. 20 tablet 2  . oxcarbazepine (TRILEPTAL) 600 MG tablet TAKE 1 & 1/2 TABLETS BY MOUTH TWICE A DAY 90 tablet 11  . topiramate (TOPAMAX) 100 MG tablet Take 3 tablets (300 mg total) by mouth at bedtime. 90 tablet 11   No current facility-administered medications for this visit.    Psychiatric Specialty Exam: Review of Systems  Psychiatric/Behavioral: The patient is nervous/anxious.   All other systems reviewed and are negative.   There were no vitals taken for this visit.There is no height or weight on file to calculate BMI.  General Appearance: NA  Eye Contact:  NA  Speech:  Clear and Coherent and Normal Rate  Volume:  Normal  Mood:  Mild anxiety  Affect:  NA  Thought Process:  Goal Directed and Linear  Orientation:  Full (Time, Place, and Person)  Thought Content: Logical   Suicidal Thoughts:  No  Homicidal Thoughts:  No  Memory:  Immediate;   Good Recent;   Good Remote;   Good  Judgement:  Good  Insight:  Good  Psychomotor Activity:  NA  Concentration:  Concentration: Good  Recall:  Good  Fund of Knowledge: Good  Language: Good  Akathisia:  Negative  Handed:  Right  AIMS (if indicated): not done  Assets:  Communication Skills Desire for Improvement Housing Resilience  ADL's:  Intact  Cognition: WNL  Sleep:  Good   Screenings: PHQ2-9     Office Visit from 06/22/2019 in [facility] Office Visit from 04/02/2019 in [facility] Office Visit from 04/15/2017 in [facility] Office Visit from 01/28/2017 in [facility] Office Visit from 12/12/2016 in [facility]  PHQ-2 Total Score  0  0  6  6   0  PHQ-9 Total Score  -  -  19  22  -       Assessment and Plan:  28yo female with seizure disorder and headachesas well asmajor depressive disorder and generalized anxiety disorder.She hasbeen on 20mg  of citalopramwithgood response and tolerability.Sleep adequate without trazodone.Hydroxyzine prn anxiety also is effectiveand again she does not need it often. She managed to refrain from smoking marijuana fora year now.She has been granted disability in late July and now has health insurance. Her mood has improved:Mild situational anxiety and practically no depression. She reportsless frequentheadaches(also hx of seizures)- she is on Trileptal and Topamax for the above.  Dx: GAD, MDD recurrent in partial remission  Plan: Continue citalopram and hydroxyzine unchanged. Next medication management visit in3 months.The plan  was discussed with patient who had an opportunity to ask questions and these were all answered. I spend79minutes in phoneconsultationwith thepatient.    Stephanie Acre, MD 11/04/2019, 8:37 AM

## 2019-12-03 ENCOUNTER — Ambulatory Visit: Payer: Medicare HMO | Admitting: Gastroenterology

## 2019-12-07 ENCOUNTER — Ambulatory Visit: Payer: Medicare HMO | Admitting: Family Medicine

## 2019-12-21 ENCOUNTER — Ambulatory Visit: Payer: Self-pay | Admitting: Family Medicine

## 2019-12-28 ENCOUNTER — Encounter: Payer: Self-pay | Admitting: Family Medicine

## 2019-12-28 ENCOUNTER — Ambulatory Visit: Payer: Self-pay | Admitting: Family Medicine

## 2019-12-28 ENCOUNTER — Ambulatory Visit (INDEPENDENT_AMBULATORY_CARE_PROVIDER_SITE_OTHER): Payer: Medicare Other | Admitting: Family Medicine

## 2019-12-28 ENCOUNTER — Other Ambulatory Visit: Payer: Self-pay

## 2019-12-28 VITALS — BP 111/69 | HR 93 | Temp 98.0°F | Ht 67.0 in | Wt 168.0 lb

## 2019-12-28 DIAGNOSIS — G479 Sleep disorder, unspecified: Secondary | ICD-10-CM

## 2019-12-28 DIAGNOSIS — L700 Acne vulgaris: Secondary | ICD-10-CM | POA: Diagnosis not present

## 2019-12-28 DIAGNOSIS — Z09 Encounter for follow-up examination after completed treatment for conditions other than malignant neoplasm: Secondary | ICD-10-CM

## 2019-12-28 DIAGNOSIS — R569 Unspecified convulsions: Secondary | ICD-10-CM | POA: Diagnosis not present

## 2019-12-28 DIAGNOSIS — F411 Generalized anxiety disorder: Secondary | ICD-10-CM

## 2019-12-28 NOTE — Progress Notes (Signed)
[facility]   Established Patient Office Visit  Subjective:  Patient ID: Lisa Shaw, female    DOB: 03/24/1991  Age: 29 y.o. MRN: [id number]  CC:  Chief Complaint  Patient presents with  . Follow-up    requesting referral to [facility]; Fax : [phone]    HPI SIREEN HALK is a 29 year old female who presents for Follow Up today.    Patient Active Problem List   Diagnosis Date Noted  . Acne vulgaris 06/23/2019  . Muscle strain 04/02/2019  . Nausea 04/02/2019  . History of anemia 04/02/2019  . GAD (generalized anxiety disorder) 09/05/2018  . Cannabis use disorder, mild, in early remission 09/05/2018  . Major depressive disorder, recurrent episode, in partial remission ([place]) 02/12/2018  . Sleep disturbance 04/29/2017  . History of suicide attempt 02/11/2017  . Abdominal pain 10/29/2016  . Seizures ([place]) 10/25/2016  . Chronic headaches 05/18/2013    Past Medical History:  Diagnosis Date  . Acne vulgaris   . Depression 04/29/2017  . Epilepsy ([place])   . Migraine   . Seizures ([place]) 07/18/2016  . Sleep disturbance 04/29/2017    Current Status: Since hers last office visit, she is doing well with no complaints. She continues to follow up with Dr. Montel Culver at [facility] as needed. She continues to have increasing acne flare-ups. She denies fevers, chills, fatigue, recent infections, weight loss, and night sweats. She has not had any headaches, visual changes, dizziness, and falls. No chest pain, heart palpitations, cough and shortness of breath reported. Denies GI problems such as nausea, vomiting, diarrhea, and constipation. She has no reports of blood in stools, dysuria and hematuria. No depression or anxiety, and denies suicidal ideations, homicidal ideations, or auditory hallucinations. She is taking all medications as prescribed. She denies pain today.     History reviewed. No pertinent  surgical history.  Family History  Problem Relation Age of Onset  . Seizures Other     Social History   Socioeconomic History  . Marital status: Single    Spouse name: Not on file  . Number of children: 0  . Years of education: 12th  . 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 Smoking status: Current Every Day Smoker    Packs/day: 0.25    Types: Cigarettes  . Smokeless tobacco: Never Used  . Tobacco comment: 6-7 cigarettes per day  Vaping Use  . Vaping Use: Never used  Substance and Sexual Activity  . Alcohol use: No  . Drug use: Not Currently    Types: Marijuana    Comment: trying to quit  . Sexual activity: Yes    Birth control/protection: Condom  Other Topics Concern  . Not on file  Social History Narrative   Patient lives in 2 story home with her mother.   High school education   Right handed    Social Determinants of Health   Financial Resource Strain:   . Difficulty of Paying Living Expenses:   Food Insecurity:   . Worried About Charity fundraiser in the Last Year:   . Arboriculturist in the Last Year:   Transportation Needs:   . Film/video editor (Medical):   Marland Kitchen Lack of Transportation (Non-Medical):   Physical Activity:   . Days of Exercise per Week:   . Minutes of Exercise per Session:  Stress:   . Feeling of Stress :   Social Connections:   . Frequency of Communication with Friends and Family:   . Frequency of Social Gatherings with Friends and Family:   . Attends Religious Services:   . Active Member of Clubs or Organizations:   . Attends Banker Meetings:   Marland Kitchen Marital Status:   Intimate Partner Violence:   . Fear of Current or Ex-Partner:   . Emotionally Abused:   Marland Kitchen Physically Abused:   . Sexually Abused:     Outpatient Medications Prior to Visit  Medication Sig Dispense Refill  . citalopram (CELEXA) 20 MG tablet Take 1 tablet (20 mg total) by mouth  daily. 90 tablet 1  . cyclobenzaprine (FLEXERIL) 10 MG tablet Take 1 tablet (10 mg total) by mouth 3 (three) times daily as needed for muscle spasms. 30 tablet 3  . hydrOXYzine (ATARAX/VISTARIL) 25 MG tablet Take 1 tablet (25 mg total) by mouth 3 (three) times daily as needed for anxiety. 90 tablet 5  . ondansetron (ZOFRAN) 4 MG tablet Take 1 tablet (4 mg total) by mouth every 8 (eight) hours as needed for nausea or vomiting. 20 tablet 2  . oxcarbazepine (TRILEPTAL) 600 MG tablet TAKE 1 & 1/2 TABLETS BY MOUTH TWICE A DAY 90 tablet 11  . topiramate (TOPAMAX) 100 MG tablet Take 3 tablets (300 mg total) by mouth at bedtime. 90 tablet 11   No facility-administered medications prior to visit.    No Known Allergies  ROS Review of Systems  Constitutional: Negative.   HENT: Negative.   Eyes: Negative.   Respiratory: Negative.   Gastrointestinal: Negative.   Endocrine: Negative.   Genitourinary: Negative.   Musculoskeletal: Negative.   Skin:       ancne  Allergic/Immunologic: Negative.   Neurological: Negative.   Psychiatric/Behavioral: Negative.       Objective:    Physical Exam Vitals and nursing note reviewed.  Constitutional:      Appearance: Normal appearance.  HENT:     Head: Normocephalic and atraumatic.     Nose: Nose normal.     Mouth/Throat:     Mouth: Mucous membranes are moist.  Eyes:     Pupils: Pupils are equal, round, and reactive to light.  Cardiovascular:     Rate and Rhythm: Normal rate and regular rhythm.     Pulses: Normal pulses.     Heart sounds: Normal heart sounds.  Pulmonary:     Effort: Pulmonary effort is normal.     Breath sounds: Normal breath sounds.  Abdominal:     General: Bowel sounds are normal.     Palpations: Abdomen is soft.  Musculoskeletal:        General: Normal range of motion.     Cervical back: Normal range of motion and neck supple.  Skin:    General: Skin is warm and dry.  Neurological:     General: No focal deficit  present.     Mental Status: She is alert and oriented to person, place, and time.  Psychiatric:        Mood and Affect: Mood normal.        Behavior: Behavior normal.        Thought Content: Thought content normal.        Judgment: Judgment normal.    BP 111/69 (BP Location: Right Arm, Patient Position: Sitting, Cuff Size: Small)   Pulse 93   Temp 98 F (36.7 C)   Ht 5\' 7"  (  1.702 m)   Wt 168 lb 0.4 oz (76.2 kg)   LMP 12/26/2019   SpO2 100%   BMI 26.32 kg/m  Wt Readings from Last 3 Encounters:  12/28/19 168 lb 0.4 oz (76.2 kg)  10/11/19 150 lb (68 kg)  06/22/19 160 lb 3.2 oz (72.7 kg)     Health Maintenance Due  Topic Date Due  . Hepatitis C Screening  Never done  . COVID-19 Vaccine (1) Never done  . PAP-Cervical Cytology Screening  12/12/2019  . PAP SMEAR-Modifier  12/12/2019    There are no preventive care reminders to display for this patient.  Lab Results  Component Value Date   TSH 0.989 04/02/2019   Lab Results  Component Value Date   WBC 5.4 04/02/2019   HGB 13.7 04/02/2019   HCT 41.7 04/02/2019   MCV 100 (H) 04/02/2019   PLT 313 04/02/2019   Lab Results  Component Value Date   NA 136 04/02/2019   K 4.0 04/02/2019   CO2 21 04/02/2019   GLUCOSE 55 (L) 04/02/2019   BUN 10 04/02/2019   CREATININE 0.78 04/02/2019   BILITOT 0.2 04/02/2019   ALKPHOS 89 04/02/2019   AST 16 04/02/2019   ALT 15 04/02/2019   PROT 7.0 04/02/2019   ALBUMIN 4.5 04/02/2019   CALCIUM 9.8 04/02/2019   ANIONGAP 9 05/14/2018   No results found for: CHOL No results found for: HDL No results found for: LDLCALC No results found for: TRIG No results found for: CHOLHDL Lab Results  Component Value Date   HGBA1C 4.5 (L) 01/23/2017      Assessment & Plan:   1. Acne vulgaris - Ambulatory referral to Dermatology  2. Seizures (HCC) Stable. No signs or symptoms of recurrence noted or reported today. She will continue Oxcarbazepine as prescribed.    3. Sleep  disturbance Continue Trazodone as needed.  4. GAD (generalized anxiety disorder) Continue Citalopram as prescribed.   5. Follow up She will follow up in 6 months.   No orders of the defined types were placed in this encounter.   Orders Placed This Encounter  Procedures  . Ambulatory referral to Dermatology     Referral Orders     Ambulatory referral to Dermatology   Raliegh Ip,  MSN, FNP-BC [facility] [address] [place], [place] [address] [phone]- fax   Problem List Items Addressed This Visit      Musculoskeletal and Integument   Acne vulgaris - Primary   Relevant Orders   Ambulatory referral to Dermatology     Other   GAD (generalized anxiety disorder)   Seizures (HCC)   Sleep disturbance    Other Visit Diagnoses    Follow up          No orders of the defined types were placed in this encounter.   Follow-up: Return in about 6 months (around 06/28/2020) for Labs/OV.    Kallie Locks, FNP

## 2019-12-30 ENCOUNTER — Other Ambulatory Visit: Payer: Self-pay

## 2019-12-30 DIAGNOSIS — L700 Acne vulgaris: Secondary | ICD-10-CM

## 2020-01-11 ENCOUNTER — Ambulatory Visit: Payer: Medicare Other | Admitting: Gastroenterology

## 2020-01-21 ENCOUNTER — Other Ambulatory Visit: Payer: Self-pay

## 2020-01-21 ENCOUNTER — Telehealth (INDEPENDENT_AMBULATORY_CARE_PROVIDER_SITE_OTHER): Payer: Medicare Other | Admitting: Psychiatry

## 2020-01-21 DIAGNOSIS — F3341 Major depressive disorder, recurrent, in partial remission: Secondary | ICD-10-CM | POA: Diagnosis not present

## 2020-01-21 DIAGNOSIS — F411 Generalized anxiety disorder: Secondary | ICD-10-CM | POA: Diagnosis not present

## 2020-01-21 MED ORDER — CITALOPRAM HYDROBROMIDE 20 MG PO TABS: 20.0000 mg | ORAL_TABLET | Freq: Every day | ORAL | 1 refills | Status: DC

## 2020-01-21 NOTE — Progress Notes (Signed)
BH MD/PA/NP OP Progress Note  01/21/2020 9:43 AM Lisa Shaw  MRN:  [id number] Interview was conducted by phone and I verified that I was speaking with the correct person using two identifiers. I discussed the limitations of evaluation and management by telemedicine and  the availability of in person appointments. Patient expressed understanding and agreed to proceed. Patient location - home; physician - home office.  Chief Complaint: None.  HPI: 29yo female with seizure disorder and headachesas well asmajor depressive disorder and generalized anxiety disorder.She hasbeen on 20mg  of citalopramwithgood response and tolerability.Sleep is adequate.Hydroxyzine prn anxiety also is effectiveand she does not need it often. She managed to refrain from smoking marijuana forover a year now.She has been granted disability in late July and now has health insurance. Her mood has improved: occasional situational anxietyand no depression. She reportsless frequentheadaches(also hx of seizures)- she is on Trileptal (only takes 600 mg at HS) and Topamax for the above.  Visit Diagnosis:    ICD-10-CM   1. GAD (generalized anxiety disorder)  F41.1   2. Major depressive disorder, recurrent episode, in partial remission (HCC)  F33.41     Past Psychiatric History: Please see intake H&P.  Past Medical History:  Past Medical History:  Diagnosis Date  . Acne vulgaris   . Depression 04/29/2017  . Epilepsy (HCC)   . Migraine   . Seizures (HCC) 07/18/2016  . Sleep disturbance 04/29/2017   No past surgical history on file.  Family Psychiatric History: None.  Family History:  Family History  Problem Relation Age of Onset  . Seizures Other     Social History:  Social History   Socioeconomic History  . Marital status: Single    Spouse name: Not on file  . Number of children: 0  . Years of education: 12th  . 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 Smoking status: Current Every Day Smoker    Packs/day: 0.25    Types: Cigarettes  . Smokeless tobacco: Never Used  . Tobacco comment: 6-7 cigarettes per day  Vaping Use  . Vaping Use: Never used  Substance and Sexual Activity  . Alcohol use: No  . Drug use: Not Currently    Types: Marijuana    Comment: trying to quit  . Sexual activity: Yes    Birth control/protection: Condom  Other Topics Concern  . Not on file  Social History Narrative   Patient lives in 2 story home with her mother.   High school education   Right handed    Social Determinants of Health   Financial Resource Strain:   . Difficulty of Paying Living Expenses:   Food Insecurity:   . Worried About [place] in the Last Year:   . Programme researcher, broadcasting/film/video in the Last Year:   Transportation Needs:   . Barista (Medical):   Freight forwarder Lack of Transportation (Non-Medical):   Physical Activity:   . Days of Exercise per Week:   . Minutes of Exercise per Session:   Stress:   . Feeling of Stress :   Social Connections:   . Frequency of Communication with Friends and Family:   . Frequency of Social Gatherings with Friends and Family:   . Attends Religious Services:   . Active Member of Clubs or Organizations:   . Attends Marland Kitchen Meetings:   Banker Marital Status:  Allergies: No Known Allergies  Metabolic Disorder Labs: Lab Results  Component Value Date   HGBA1C 4.5 (L) 01/23/2017   No results found for: PROLACTIN No results found for: CHOL, TRIG, HDL, CHOLHDL, VLDL, LDLCALC Lab Results  Component Value Date   TSH 0.989 04/02/2019   TSH 1.180 01/23/2017    Therapeutic Level Labs: No results found for: LITHIUM No results found for: VALPROATE No components found for:  CBMZ  Current Medications: Current Outpatient Medications  Medication Sig Dispense Refill  . citalopram (CELEXA) 20 MG tablet Take 1 tablet (20 mg total) by mouth daily. 90  tablet 1  . cyclobenzaprine (FLEXERIL) 10 MG tablet Take 1 tablet (10 mg total) by mouth 3 (three) times daily as needed for muscle spasms. 30 tablet 3  . hydrOXYzine (ATARAX/VISTARIL) 25 MG tablet Take 1 tablet (25 mg total) by mouth 3 (three) times daily as needed for anxiety. 90 tablet 5  . ondansetron (ZOFRAN) 4 MG tablet Take 1 tablet (4 mg total) by mouth every 8 (eight) hours as needed for nausea or vomiting. 20 tablet 2  . oxcarbazepine (TRILEPTAL) 600 MG tablet TAKE 1 & 1/2 TABLETS BY MOUTH TWICE A DAY 90 tablet 11  . topiramate (TOPAMAX) 100 MG tablet Take 3 tablets (300 mg total) by mouth at bedtime. 90 tablet 11   No current facility-administered medications for this visit.     Psychiatric Specialty Exam: Review of Systems  All other systems reviewed and are negative.   Last menstrual period 12/26/2019.There is no height or weight on file to calculate BMI.  General Appearance: NA  Eye Contact:  NA  Speech:  Clear and Coherent  Volume:  Normal  Mood:  Euthymic  Affect:  NA  Thought Process:  Goal Directed  Orientation:  Full (Time, Place, and Person)  Thought Content: Logical   Suicidal Thoughts:  No  Homicidal Thoughts:  No  Memory:  Immediate;   Good Recent;   Good Remote;   Good  Judgement:  Good  Insight:  Good  Psychomotor Activity:  NA  Concentration:  Concentration: Good  Recall:  Good  Fund of Knowledge: Good  Language: Good  Akathisia:  Negative  Handed:  Right  AIMS (if indicated): not done  Assets:  Communication Skills Desire for Improvement Financial Resources/Insurance Housing Resilience  ADL's:  Intact  Cognition: WNL  Sleep:  Good   Screenings: PHQ2-9     Office Visit from 12/28/2019 in [facility] Office Visit from 06/22/2019 in [facility] Office Visit from 04/02/2019 in [facility] Office Visit from 04/15/2017 in [facility] Office Visit from 01/28/2017 in [facility]  PHQ-2 Total Score 1 0 0 6 6  PHQ-9 Total Score 1 - - 19 22       Assessment and Plan: 29yo female with seizure disorder and headachesas well asmajor depressive disorder and generalized anxiety disorder.She hasbeen on 20mg  of citalopramwithgood response and tolerability.Sleep is adequate.Hydroxyzine prn anxiety also is effectiveand she does not need it often. She managed to refrain from smoking marijuana forover a year now.She has been granted disability in late July and now has health insurance. Her mood has improved: occasional situational anxietyand no depression. She reportsless frequentheadaches(also hx of seizures)- she is on Trileptal (only takes 600 mg at HS) and Topamax for the above.  Dx: GAD, MDD recurrent in remission  Plan: Continue citalopramandhydroxyzine unchanged. Next medication management visit in49months.The plan  was discussed with patient who had an opportunity to ask questions and these were all answered. I spend90minutes in phoneconsultationwith thepatient.   Magdalene Patricia, MD 01/21/2020, 9:43 AM

## 2020-04-16 ENCOUNTER — Other Ambulatory Visit: Payer: Self-pay | Admitting: Neurology

## 2020-04-20 ENCOUNTER — Ambulatory Visit (INDEPENDENT_AMBULATORY_CARE_PROVIDER_SITE_OTHER): Payer: Medicare Other | Admitting: Physician Assistant

## 2020-04-20 ENCOUNTER — Other Ambulatory Visit: Payer: Self-pay

## 2020-04-20 ENCOUNTER — Encounter: Payer: Self-pay | Admitting: Physician Assistant

## 2020-04-20 DIAGNOSIS — L700 Acne vulgaris: Secondary | ICD-10-CM

## 2020-04-20 MED ORDER — DOXYCYCLINE HYCLATE 100 MG PO TABS: 100.0000 mg | ORAL_TABLET | Freq: Two times a day (BID) | ORAL | 2 refills | Status: AC

## 2020-04-20 MED ORDER — ADAPALENE-BENZOYL PEROXIDE 0.1-2.5 % EX GEL: 1.0000 "application " | Freq: Every day | CUTANEOUS | 2 refills | Status: DC

## 2020-04-20 NOTE — Progress Notes (Signed)
° °  New Patient Visit  Subjective  Lisa Shaw is a 29 y.o. female who presents for the following: Acne (Has breakouts on face. PCP gave her clindamycin BPO gel. Not much help. washes with cetaphil. ). No acne on chest or back. Just on face. Acne has been a problem all through her teen years and is still a problem. She has used benzaclin most recently. She has been on [facility] in the past year. She has been on a birth control pills in the past but not since she was 29 years old. She doesn't recall a difference with the acne when she was on birth control but she feels it wasn't this bad. She washes her face with cetaphil. She has combo skin. The benzaclin did seem to irritate her skin.   Objective  Well appearing patient in no apparent distress; mood and affect are within normal limits.  Face examined. Relevant physical exam findings are noted in the Assessment and Plan.  Objective  Head - Anterior (Face): Erythematous papules and pustules with comedones. Pitted scarring and PIH noted.  Assessment & Plan  Acne vulgaris Head - Anterior (Face)  We discussed the use of hormones for her acne. We discussed isotretinoin and spironolactone. She wants to research both of these meds.   doxycycline (VIBRA-TABS) 100 MG tablet - Head - Anterior (Face)  Adapalene-Benzoyl Peroxide (EPIDUO) 0.1-2.5 % gel - Head - Anterior (Face)

## 2020-04-21 ENCOUNTER — Telehealth (INDEPENDENT_AMBULATORY_CARE_PROVIDER_SITE_OTHER): Payer: Medicare Other | Admitting: Psychiatry

## 2020-04-21 DIAGNOSIS — F3342 Major depressive disorder, recurrent, in full remission: Secondary | ICD-10-CM | POA: Diagnosis not present

## 2020-04-21 DIAGNOSIS — F411 Generalized anxiety disorder: Secondary | ICD-10-CM | POA: Diagnosis not present

## 2020-04-21 NOTE — Progress Notes (Signed)
BH MD/PA/NP OP Progress Note  04/21/2020 9:43 AM Lisa Shaw  MRN:  [id number] Interview was conducted by phone and I verified that I was speaking with the correct person using two identifiers. I discussed the limitations of evaluation and management by telemedicine and  the availability of in person appointments. Patient expressed understanding and agreed to proceed. Patient location - home; physician - home office.  Chief Complaint: "I am doing well".  HPI: 29yo female with seizure disorder and headachesas well asmajor depressive disorder and generalized anxiety disorder.She hasbeen on 20mg  of citalopramwithgood response for the past year and a half.Sleep is adequate.Hydroxyzine prn anxiety has been prescribed but she has not needed it for a few months. She has been granted disability in late July and now has health insurance.  She reportsless frequentheadaches(also hx of seizures)- she is on Trileptal (only takes 600 mg at HS) and Topamax for the above.   Visit Diagnosis:    ICD-10-CM   1. GAD (generalized anxiety disorder)  F41.1   2. Major depressive disorder, recurrent episode, in full remission (HCC)  F33.42     Past Psychiatric History: Please see intake H&P.  Past Medical History:  Past Medical History:  Diagnosis Date  . Acne vulgaris   . Depression 04/29/2017  . Epilepsy (HCC)   . Migraine   . Seizures (HCC) 07/18/2016  . Sleep disturbance 04/29/2017   No past surgical history on file.  Family Psychiatric History: None.  Family History:  Family History  Problem Relation Age of Onset  . Seizures Other     Social History:  Social History   Socioeconomic History  . Marital status: Single    Spouse name: Not on file  . Number of children: 0  . Years of education: 12th  . 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 Smoking status: Current Every Day Smoker    Packs/day:  0.25    Types: Cigarettes  . Smokeless tobacco: Never Used  . Tobacco comment: 6-7 cigarettes per day  Vaping Use  . Vaping Use: Never used  Substance and Sexual Activity  . Alcohol use: No  . Drug use: Not Currently    Types: Marijuana    Comment: trying to quit  . Sexual activity: Yes    Birth control/protection: Condom  Other Topics Concern  . Not on file  Social History Narrative   Patient lives in 2 story home with her mother.   High school education   Right handed    Social Determinants of Health   Financial Resource Strain:   . Difficulty of Paying Living Expenses: Not on file  Food Insecurity:   . Worried About [place] in the Last Year: Not on file  . Ran Out of Food in the Last Year: Not on file  Transportation Needs:   . Lack of Transportation (Medical): Not on file  . Lack of Transportation (Non-Medical): Not on file  Physical Activity:   . Days of Exercise per Week: Not on file  . Minutes of Exercise per Session: Not on file  Stress:   . Feeling of Stress : Not on file  Social Connections:   . Frequency of Communication with Friends and Family: Not on file  . Frequency of Social Gatherings with Friends and Family: Not on file  . Attends Religious Services: Not on file  . Active Member of Clubs or Organizations:  Not on file  . Attends Banker Meetings: Not on file  . Marital Status: Not on file    Allergies: No Known Allergies  Metabolic Disorder Labs: Lab Results  Component Value Date   HGBA1C 4.5 (L) 01/23/2017   No results found for: PROLACTIN No results found for: CHOL, TRIG, HDL, CHOLHDL, VLDL, LDLCALC Lab Results  Component Value Date   TSH 0.989 04/02/2019   TSH 1.180 01/23/2017    Therapeutic Level Labs: No results found for: LITHIUM No results found for: VALPROATE No components found for:  CBMZ  Current Medications: Current Outpatient Medications  Medication Sig Dispense Refill  . Adapalene-Benzoyl  Peroxide (EPIDUO) 0.1-2.5 % gel Apply 1 application topically at bedtime. 45 g 2  . citalopram (CELEXA) 20 MG tablet Take 1 tablet (20 mg total) by mouth at bedtime. 90 tablet 1  . cyclobenzaprine (FLEXERIL) 10 MG tablet Take 1 tablet (10 mg total) by mouth 3 (three) times daily as needed for muscle spasms. 30 tablet 3  . doxycycline (VIBRA-TABS) 100 MG tablet Take 1 tablet (100 mg total) by mouth 2 (two) times daily. 60 tablet 2  . ondansetron (ZOFRAN) 4 MG tablet Take 1 tablet (4 mg total) by mouth every 8 (eight) hours as needed for nausea or vomiting. 20 tablet 2  . oxcarbazepine (TRILEPTAL) 600 MG tablet TAKE 1 AND 1/2 TABLETS BY MOUTH TWICE DAILY 90 tablet 11  . topiramate (TOPAMAX) 100 MG tablet Take 3 tablets (300 mg total) by mouth at bedtime. 90 tablet 11   No current facility-administered medications for this visit.     Psychiatric Specialty Exam: Review of Systems  Neurological: Positive for headaches.  All other systems reviewed and are negative.   There were no vitals taken for this visit.There is no height or weight on file to calculate BMI.  General Appearance: NA  Eye Contact:  NA  Speech:  Clear and Coherent and Normal Rate  Volume:  Normal  Mood:  Euthymic  Affect:  NA  Thought Process:  Goal Directed and Linear  Orientation:  Full (Time, Place, and Person)  Thought Content: Logical   Suicidal Thoughts:  No  Homicidal Thoughts:  No  Memory:  Immediate;   Good Recent;   Good Remote;   Good  Judgement:  Good  Insight:  Good  Psychomotor Activity:  NA  Concentration:  Concentration: Good  Recall:  Good  Fund of Knowledge: Good  Language: Good  Akathisia:  Negative  Handed:  Right  AIMS (if indicated): not done  Assets:  Communication Skills  ADL's:  Intact  Cognition: WNL  Sleep:  Good   Screenings: PHQ2-9     Office Visit from 12/28/2019 in [facility] Office Visit from 06/22/2019 in [facility] Office Visit from  04/02/2019 in [facility] Office Visit from 04/15/2017 in [facility] Office Visit from 01/28/2017 in [facility]  PHQ-2 Total Score 1 0 0 6 6  PHQ-9 Total Score 1 -- -- 19 22       Assessment and Plan: 29yo female with seizure disorder and headachesas well asmajor depressive disorder and generalized anxiety disorder.She hasbeen on 20mg  of citalopramwithgood response for the past year and a half.Sleep is adequate.Hydroxyzine prn anxiety has been prescribed but she has not needed it for a few months. She has been granted disability in late July and now has health insurance.  She reportsless frequentheadaches(also hx of  seizures)- she is on Trileptal (only takes 600 mg at HS) and Topamax for the above.  Dx: GAD, MDD recurrent in remission  Plan: Continue citalopramandhydroxyzine prn unchanged. Next medication management visit in52months.The plan was discussed with patient who had an opportunity to ask questions and these were all answered. I spend3minutes in phoneconsultationwith thepatient.    Magdalene Patricia, MD 04/21/2020, 9:43 AM

## 2020-04-26 ENCOUNTER — Other Ambulatory Visit: Payer: Self-pay

## 2020-04-26 MED ORDER — OXCARBAZEPINE 600 MG PO TABS: ORAL_TABLET | ORAL | 11 refills | Status: DC

## 2020-05-12 ENCOUNTER — Ambulatory Visit: Payer: Medicare HMO | Admitting: Neurology

## 2020-06-14 DIAGNOSIS — E559 Vitamin D deficiency, unspecified: Secondary | ICD-10-CM

## 2020-06-14 HISTORY — DX: Vitamin D deficiency, unspecified: E55.9

## 2020-06-22 ENCOUNTER — Ambulatory Visit: Payer: Medicare Other | Admitting: Dermatology

## 2020-06-28 ENCOUNTER — Encounter: Payer: Self-pay | Admitting: Family Medicine

## 2020-06-28 ENCOUNTER — Ambulatory Visit (INDEPENDENT_AMBULATORY_CARE_PROVIDER_SITE_OTHER): Payer: Medicare Other | Admitting: Family Medicine

## 2020-06-28 ENCOUNTER — Other Ambulatory Visit: Payer: Self-pay

## 2020-06-28 VITALS — BP 109/64 | HR 90 | Temp 97.9°F | Ht 67.0 in | Wt 178.0 lb

## 2020-06-28 DIAGNOSIS — L700 Acne vulgaris: Secondary | ICD-10-CM | POA: Diagnosis not present

## 2020-06-28 DIAGNOSIS — F411 Generalized anxiety disorder: Secondary | ICD-10-CM

## 2020-06-28 DIAGNOSIS — G479 Sleep disorder, unspecified: Secondary | ICD-10-CM

## 2020-06-28 DIAGNOSIS — Z09 Encounter for follow-up examination after completed treatment for conditions other than malignant neoplasm: Secondary | ICD-10-CM

## 2020-06-28 DIAGNOSIS — Z0000 Encounter for general adult medical examination without abnormal findings: Secondary | ICD-10-CM

## 2020-06-28 DIAGNOSIS — R569 Unspecified convulsions: Secondary | ICD-10-CM

## 2020-06-28 DIAGNOSIS — R110 Nausea: Secondary | ICD-10-CM

## 2020-06-28 MED ORDER — ONDANSETRON HCL 4 MG PO TABS: 4.0000 mg | ORAL_TABLET | Freq: Three times a day (TID) | ORAL | 2 refills | Status: DC | PRN

## 2020-06-28 NOTE — Progress Notes (Signed)
[facility]   Annual Physical   Subjective:  Patient ID: Lisa Shaw, female    DOB: 23-Feb-1991  Age: 29 y.o. MRN: [id number]  CC:  Chief Complaint  Patient presents with  . Follow-up    HPI Lisa Shaw is a 29 year old female who presents for Annual Physical today.    Patient Active Problem List   Diagnosis Date Noted  . Acne vulgaris 06/23/2019  . Muscle strain 04/02/2019  . Nausea 04/02/2019  . History of anemia 04/02/2019  . GAD (generalized anxiety disorder) 09/05/2018  . Cannabis use disorder, mild, in sustained remission 09/05/2018  . Major depressive disorder, recurrent episode, in full remission (HCC) 02/12/2018  . Sleep disturbance 04/29/2017  . History of suicide attempt 02/11/2017  . Abdominal pain 10/29/2016  . Seizures (HCC) 10/25/2016  . Chronic headaches 05/18/2013   Current Status: Since her last office visit, she is doing well with no complaints.  She is currently following up with Psychiatry as needed. No depression or anxiety reported today. She denies fevers, chills, fatigue, recent infections, weight loss, and night sweats. She has not had any headaches, visual changes, dizziness, and falls. No chest pain, heart palpitations, cough and shortness of breath reported. Denies GI problems such as nausea, vomiting, diarrhea, and constipation. She has no reports of blood in stools, dysuria and hematuria.  She is taking all medications as prescribed. She denies pain today.   Past Medical History:  Diagnosis Date  . Acne vulgaris   . Depression 04/29/2017  . Epilepsy (HCC)   . Migraine   . Seizures (HCC) 07/18/2016  . Sleep disturbance 04/29/2017    No past surgical history on file.  Family History  Problem Relation Age of Onset  . Seizures Other     Social History   Socioeconomic History  . Marital status: Single    Spouse name: Not on file  . Number of children: 0  . Years of  education: 12th  . 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 Smoking status: Current Every Day Smoker    Packs/day: 0.25    Types: Cigarettes  . Smokeless tobacco: Never Used  . Tobacco comment: 6-7 cigarettes per day  Vaping Use  . Vaping Use: Never used  Substance and Sexual Activity  . Alcohol use: No  . Drug use: Not Currently    Types: Marijuana    Comment: trying to quit  . Sexual activity: Yes    Birth control/protection: Condom  Other Topics Concern  . Not on file  Social History Narrative   Patient lives in 2 story home with her mother.   High school education   Right handed    Social Determinants of Health   Financial Resource Strain: Not on file  Food Insecurity: Not on file  Transportation Needs: Not on file  Physical Activity: Not on file  Stress: Not on file  Social Connections: Not on file  Intimate Partner Violence: Not on file    Outpatient Medications Prior to Visit  Medication Sig Dispense Refill  . Adapalene-Benzoyl Peroxide (EPIDUO) 0.1-2.5 % gel Apply 1 application topically at bedtime. 45 g 2  . citalopram (CELEXA) 20 MG tablet Take 1 tablet (20 mg total) by mouth at bedtime. 90 tablet 1  . cyclobenzaprine (FLEXERIL) 10 MG tablet Take 1 tablet (10 mg total) by mouth 3 (  three) times daily as needed for muscle spasms. 30 tablet 3  . oxcarbazepine (TRILEPTAL) 600 MG tablet TAKE 1 AND 1/2 TABLETS BY MOUTH TWICE DAILY 90 tablet 11  . topiramate (TOPAMAX) 100 MG tablet Take 3 tablets (300 mg total) by mouth at bedtime. 90 tablet 11  . ondansetron (ZOFRAN) 4 MG tablet Take 1 tablet (4 mg total) by mouth every 8 (eight) hours as needed for nausea or vomiting. 20 tablet 2   No facility-administered medications prior to visit.    No Known Allergies  ROS Review of Systems  Constitutional: Negative.   HENT: Negative.   Eyes: Negative.   Respiratory: Negative.   Endocrine:  Negative.   Genitourinary: Negative.   Neurological: Positive for seizures (stable. ).    Objective:    Physical Exam Vitals and nursing note reviewed.  Constitutional:      Appearance: Normal appearance.  HENT:     Head: Normocephalic and atraumatic.     Nose: Nose normal.     Mouth/Throat:     Mouth: Mucous membranes are moist.     Pharynx: Oropharynx is clear.  Cardiovascular:     Rate and Rhythm: Normal rate and regular rhythm.     Pulses: Normal pulses.     Heart sounds: Normal heart sounds.  Pulmonary:     Effort: Pulmonary effort is normal.     Breath sounds: Normal breath sounds.  Abdominal:     General: Bowel sounds are normal.     Palpations: Abdomen is soft.  Musculoskeletal:        General: Normal range of motion.     Cervical back: Normal range of motion and neck supple.  Skin:    General: Skin is warm and dry.  Neurological:     General: No focal deficit present.     Mental Status: She is alert and oriented to person, place, and time.  Psychiatric:        Mood and Affect: Mood normal.        Behavior: Behavior normal.        Thought Content: Thought content normal.        Judgment: Judgment normal.     BP 109/64   Pulse 90   Temp 97.9 F (36.6 C) (Temporal)   Ht 5\' 7"  (1.702 m)   Wt 178 lb (80.7 kg)   SpO2 97%   BMI 27.88 kg/m  Wt Readings from Last 3 Encounters:  06/28/20 178 lb (80.7 kg)  12/28/19 168 lb 0.4 oz (76.2 kg)  10/11/19 150 lb (68 kg)     Health Maintenance Due  Topic Date Due  . Hepatitis C Screening  Never done  . COVID-19 Vaccine (1) Never done  . PAP-Cervical Cytology Screening  12/12/2019  . PAP SMEAR-Modifier  12/12/2019  . INFLUENZA VACCINE  02/13/2020    There are no preventive care reminders to display for this patient.  Lab Results  Component Value Date   TSH 0.989 04/02/2019   Lab Results  Component Value Date   WBC 5.4 04/02/2019   HGB 13.7 04/02/2019   HCT 41.7 04/02/2019   MCV 100 (H) 04/02/2019    PLT 313 04/02/2019   Lab Results  Component Value Date   NA 136 04/02/2019   K 4.0 04/02/2019   CO2 21 04/02/2019   GLUCOSE 55 (L) 04/02/2019   BUN 10 04/02/2019   CREATININE 0.78 04/02/2019   BILITOT 0.2 04/02/2019   ALKPHOS 89 04/02/2019   AST 16 04/02/2019  ALT 15 04/02/2019   PROT 7.0 04/02/2019   ALBUMIN 4.5 04/02/2019   CALCIUM 9.8 04/02/2019   ANIONGAP 9 05/14/2018   No results found for: CHOL No results found for: HDL No results found for: LDLCALC No results found for: TRIG No results found for: [facility] Lab Results  Component Value Date   HGBA1C 4.5 (L) 01/23/2017      Assessment & Plan:   1. Annual physical exam Physical assessment within normal for age.  Recommend monthly self breast exa Recommend strength training in 150 minutes of cardiovascular exercise per week. She will continue to take medications as prescribed, to decrease high sodium intake, excessive alcohol intake, increase potassium intake, and smoking cessation as needed. She will continue to follow Heart Healthy or DASH diet.  2. Seizures (HCC) - CBC with Differential - Comprehensive metabolic panel - Lipid Panel - Vitamin B12 - Vitamin D, 25-hydroxy  3. GAD (generalized anxiety disorder) Stable.   4. Acne vulgaris  5. Sleep disturbance Stable.   6. Nausea - ondansetron (ZOFRAN) 4 MG tablet; Take 1 tablet (4 mg total) by mouth every 8 (eight) hours as needed for nausea or vomiting.  Dispense: 20 tablet; Refill: 2  7. Follow up She will follow up in 1 year for Annual Physical and Labs.  Orders Placed This Encounter  Procedures  . CBC with Differential  . Comprehensive metabolic panel  . Lipid Panel  . Vitamin B12  . Vitamin D, 25-hydroxy    Meds ordered this encounter  Medications  . ondansetron (ZOFRAN) 4 MG tablet    Sig: Take 1 tablet (4 mg total) by mouth every 8 (eight) hours as needed for nausea or vomiting.    Dispense:  20 tablet    Refill:  2    Referral Orders   No referral(s) requested today    Raliegh Ip, MSN, ANE, FNP-BC [facility] [address] [place], [place] [address] [phone] fax   Problem List Items Addressed This Visit      Musculoskeletal and Integument   Acne vulgaris     Other   GAD (generalized anxiety disorder)   Nausea   Relevant Medications   ondansetron (ZOFRAN) 4 MG tablet   Seizures (HCC)   Relevant Orders   CBC with Differential   Comprehensive metabolic panel   Lipid Panel   Vitamin B12   Vitamin D, 25-hydroxy   Sleep disturbance    Other Visit Diagnoses    Annual physical exam    -  Primary   Follow up          Meds ordered this encounter  Medications  . ondansetron (ZOFRAN) 4 MG tablet    Sig: Take 1 tablet (4 mg total) by mouth every 8 (eight) hours as needed for nausea or vomiting.    Dispense:  20 tablet    Refill:  2    Follow-up: No follow-ups on file.    Kallie Locks, FNP

## 2020-06-29 ENCOUNTER — Telehealth: Payer: Self-pay | Admitting: Neurology

## 2020-06-29 LAB — COMPREHENSIVE METABOLIC PANEL
ALT: 12 IU/L (ref 0–32)
AST: 14 IU/L (ref 0–40)
Albumin/Globulin Ratio: 1.7 (ref 1.2–2.2)
Albumin: 4.1 g/dL (ref 3.9–5.0)
Alkaline Phosphatase: 115 IU/L (ref 44–121)
BUN/Creatinine Ratio: 15 (ref 9–23)
BUN: 10 mg/dL (ref 6–20)
Bilirubin Total: <0.2 mg/dL (ref 0.0–1.2)
CO2: 19 mmol/L — ABNORMAL LOW (ref 20–29)
Calcium: 8.9 mg/dL (ref 8.7–10.2)
Chloride: 111 mmol/L — ABNORMAL HIGH (ref 96–106)
Creatinine, Ser: 0.68 mg/dL (ref 0.57–1.00)
GFR calc Af Amer: 138 mL/min/{1.73_m2} (ref >59)
GFR calc non Af Amer: 119 mL/min/{1.73_m2} (ref >59)
Globulin, Total: 2.4 g/dL (ref 1.5–4.5)
Glucose: 75 mg/dL (ref 65–99)
Potassium: 4.2 mmol/L (ref 3.5–5.2)
Sodium: 141 mmol/L (ref 134–144)
Total Protein: 6.5 g/dL (ref 6.0–8.5)

## 2020-06-29 LAB — CBC WITH DIFFERENTIAL/PLATELET
Basophils Absolute: 0.1 10*3/uL (ref 0.0–0.2)
Basos: 2 %
EOS (ABSOLUTE): 0.1 10*3/uL (ref 0.0–0.4)
Eos: 1 %
Hematocrit: 35.7 % (ref 34.0–46.6)
Hemoglobin: 12.3 g/dL (ref 11.1–15.9)
Immature Grans (Abs): 0.1 10*3/uL (ref 0.0–0.1)
Immature Granulocytes: 1 %
Lymphocytes Absolute: 2.2 10*3/uL (ref 0.7–3.1)
Lymphs: 46 %
MCH: 35.7 pg — ABNORMAL HIGH (ref 26.6–33.0)
MCHC: 34.5 g/dL (ref 31.5–35.7)
MCV: 104 fL — ABNORMAL HIGH (ref 79–97)
Monocytes Absolute: 0.3 10*3/uL (ref 0.1–0.9)
Monocytes: 6 %
Neutrophils Absolute: 2.1 10*3/uL (ref 1.4–7.0)
Neutrophils: 44 %
Platelets: 349 10*3/uL (ref 150–450)
RBC: 3.45 x10E6/uL — ABNORMAL LOW (ref 3.77–5.28)
RDW: 12.5 % (ref 11.7–15.4)
WBC: 4.7 10*3/uL (ref 3.4–10.8)

## 2020-06-29 LAB — VITAMIN D 25 HYDROXY (VIT D DEFICIENCY, FRACTURES): Vit D, 25-Hydroxy: 10 ng/mL — ABNORMAL LOW (ref 30.0–100.0)

## 2020-06-29 LAB — LIPID PANEL
Chol/HDL Ratio: 4.3 ratio (ref 0.0–4.4)
Cholesterol, Total: 190 mg/dL (ref 100–199)
HDL: 44 mg/dL (ref >39)
LDL Chol Calc (NIH): 120 mg/dL — ABNORMAL HIGH (ref 0–99)
Triglycerides: 146 mg/dL (ref 0–149)
VLDL Cholesterol Cal: 26 mg/dL (ref 5–40)

## 2020-06-29 LAB — VITAMIN B12: Vitamin B-12: 483 pg/mL (ref 232–1245)

## 2020-06-29 NOTE — Telephone Encounter (Signed)
Patient was transferred from accessnurse. She states she has a bad headache and would like to know if there was something that could be prescribed to help her? She has taken tylenol and excedrin but it isn't helping. She last saw Dr Karel Jarvis 10/14/19 and has a follow up scheduled for 01/11/21. Please call.

## 2020-06-30 ENCOUNTER — Encounter: Payer: Self-pay | Admitting: Family Medicine

## 2020-06-30 ENCOUNTER — Ambulatory Visit (INDEPENDENT_AMBULATORY_CARE_PROVIDER_SITE_OTHER): Payer: Medicare Other

## 2020-06-30 ENCOUNTER — Other Ambulatory Visit: Payer: Self-pay

## 2020-06-30 ENCOUNTER — Other Ambulatory Visit: Payer: Self-pay | Admitting: Family Medicine

## 2020-06-30 DIAGNOSIS — G43009 Migraine without aura, not intractable, without status migrainosus: Secondary | ICD-10-CM | POA: Diagnosis not present

## 2020-06-30 DIAGNOSIS — E559 Vitamin D deficiency, unspecified: Secondary | ICD-10-CM

## 2020-06-30 DIAGNOSIS — R519 Headache, unspecified: Secondary | ICD-10-CM

## 2020-06-30 MED ORDER — METOCLOPRAMIDE HCL 5 MG/ML IJ SOLN
10.0000 mg | Freq: Once | INTRAMUSCULAR | Status: AC
  Administered 2020-06-30: 10 mg via INTRAMUSCULAR

## 2020-06-30 MED ORDER — VITAMIN D (ERGOCALCIFEROL) 1.25 MG (50000 UNIT) PO CAPS: 50000.0000 [IU] | ORAL_CAPSULE | ORAL | 6 refills | Status: DC

## 2020-06-30 MED ORDER — DIPHENHYDRAMINE HCL 50 MG/ML IJ SOLN
25.0000 mg | Freq: Once | INTRAMUSCULAR | Status: AC
  Administered 2020-06-30: 25 mg via INTRAMUSCULAR

## 2020-06-30 MED ORDER — KETOROLAC TROMETHAMINE 60 MG/2ML IM SOLN
60.0000 mg | Freq: Once | INTRAMUSCULAR | Status: AC
  Administered 2020-06-30: 60 mg via INTRAMUSCULAR

## 2020-06-30 NOTE — Telephone Encounter (Signed)
Pls offer migraine cocktail, thanks 

## 2020-06-30 NOTE — Telephone Encounter (Signed)
Pt is going to come In for a nurse visit for a headache cocktail

## 2020-06-30 NOTE — Progress Notes (Signed)
Pt came in for headache cocktail given in left ventrogluteal no adverse reaction pt  tolerated well.  Pt mother was here to drive her home.

## 2020-06-30 NOTE — Telephone Encounter (Signed)
Patient called back this morning crying and in tears and she still has a headache and patient expressed that nobody from the office called to check on her yesterday.

## 2020-07-19 ENCOUNTER — Telehealth (HOSPITAL_COMMUNITY): Payer: Medicare Other | Admitting: Psychiatry

## 2020-07-19 ENCOUNTER — Other Ambulatory Visit: Payer: Self-pay

## 2020-07-19 MED ORDER — CITALOPRAM HYDROBROMIDE 20 MG PO TABS: 20.0000 mg | ORAL_TABLET | Freq: Every day | ORAL | 0 refills | Status: DC

## 2020-08-14 ENCOUNTER — Encounter: Payer: Self-pay | Admitting: Neurology

## 2020-08-14 ENCOUNTER — Telehealth (INDEPENDENT_AMBULATORY_CARE_PROVIDER_SITE_OTHER): Payer: Medicare Other | Admitting: Neurology

## 2020-08-14 ENCOUNTER — Other Ambulatory Visit: Payer: Self-pay

## 2020-08-14 VITALS — Ht 67.0 in | Wt 178.0 lb

## 2020-08-14 DIAGNOSIS — G43009 Migraine without aura, not intractable, without status migrainosus: Secondary | ICD-10-CM

## 2020-08-14 DIAGNOSIS — G40009 Localization-related (focal) (partial) idiopathic epilepsy and epileptic syndromes with seizures of localized onset, not intractable, without status epilepticus: Secondary | ICD-10-CM | POA: Diagnosis not present

## 2020-08-14 MED ORDER — TOPIRAMATE 100 MG PO TABS: 300.0000 mg | ORAL_TABLET | Freq: Every day | ORAL | 11 refills | Status: DC

## 2020-08-14 MED ORDER — OXCARBAZEPINE 600 MG PO TABS: ORAL_TABLET | ORAL | 3 refills | Status: DC

## 2020-08-14 MED ORDER — SUMATRIPTAN SUCCINATE 50 MG PO TABS: ORAL_TABLET | ORAL | 11 refills | Status: DC

## 2020-08-14 NOTE — Progress Notes (Signed)
Virtual Visit via Video Note The purpose of this virtual visit is to provide medical care while limiting exposure to the novel coronavirus.    Consent was obtained for video visit:  Yes.   Answered questions that patient had about telehealth interaction:  Yes.   I discussed the limitations, risks, security and privacy concerns of performing an evaluation and management service by telemedicine. I also discussed with the patient that there may be a patient responsible charge related to this service. The patient expressed understanding and agreed to proceed.  Pt location: Home Physician Location: office Name of referring provider:  Kallie Locks, FNP I connected with Lisa Shaw at patients initiation/request on 08/14/2020 at  3:30 PM EST by video enabled telemedicine application and verified that I am speaking with the correct person using two identifiers. Pt MRN:  [id number] Pt DOB:  [birth year] Video Participants:  Lisa Shaw   History of Present Illness:  The patient had a virtual video visit on 08/14/2020. 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 Prior EEG reported left frontal sharp waves. She denies any seizures since June 2020. She lives alone, denies any staring/unresponsive episodes, loss of time, olfactory/gustatory hallucinations, focal numbness/tingling/weakness, myoclonic jerks. She has occasional headaches, and asks for prn medication when Ibuprofen or Tylenol does not work. There is no nausea/vomiting, she is sensitive to lights and sounds sometimes. She called our office last month for right-sided headaches lasting several days, with no response to [facility] powder, Excedrin, Ibuprofen, or Midol. She was given a migraine cocktail in the office and states she has not had any more bad headaches since then. Sleep is okay. She is taking Topiramate 300mg  qhs and oxcarbazepine 600mg  qhs without side  effects. No falls.    History on Initial Assessment 12/02/2017: This is a pleasant 30 year old right-handed woman with a history of seizures since 2013. Records from [facility] and [facility] were reviewed and will be summarized here, she is depressed in the office today and is a poor historian. She initially had nocturnal seizures with described diffuse rhythmic jerking with eye rolling backwards. She had an EEG in 06/2012 reporting left frontal sharps. There is also note of episodes in 2012 where she would have staring spells lasting around 2 minutes with associated diaphoresis and post-ictal confusion with violence usually at night or early in the morning. Both seizure types were associated with tongue bite and urinary incontinence. There is report of deja vu, and she reports tingling in both hands during the "stare offs." She lives with her mother but is mostly by herself at home during the day, and feels she has the "stare off" episodes around twice a week. She was initially started on Keppra which caused mood issues, then switched to Topamax. It appears she initially had mental slowing on Topamax, but has been taking 300mg  qhs since 2015. She had seen Dr. 2013 at [facility] last year and was started on Vimpat which she stopped again due to side effects. She reports the last nocturnal convulsion was in June 2018 when she woke up feeling weird with her tongue sore and with a headache. She was given a headache cocktail but was also noted to be very anxious and depressed. She was admitted for involuntary commitment at [facility] in July 2018 for suicidal ideation. She currently denies any suicidal ideation but continues to report significant depression. She reports body twitches in her  arms or legs. She has headaches on a daily basis, taking an average of 2 Tylenol or BC powder daily. She feels it is due to "overthinking or depression." She has sleep difficulties with interrupted 5-6 hours of sleep at night. She feels  fatigued and drowsy during the day. She has dizziness when standing. Vision is occasionally blurred when watching TV. She has noticed swallowing difficulties. She notices her right foot gets numb frequently and her balance is off. No falls. 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   Epilepsy Risk Factors:  Her maternal great grandmother and great aunt had seizures. Otherwise she had a normal birth and early development.  There is no history of febrile convulsions, CNS infections such as meningitis/encephalitis, significant traumatic brain injury, neurosurgical procedures.  Prior AEDs: Keppra, Vimpat Laboratory Data:  EEGs: EEG in 06/2012 reported as abnormal secondary to focal sharp wave activity in the left frontal region MRI: I personally reviewed MRI brain without contrast done 06/2012 which was degraded by motion. Lateral ventricles are mildly prominent in size with a slightly rounded appearance, no transependymal CSF flow. Hippocampi appear symmetric with no abnormal signal seen.    Current Outpatient Medications on File Prior to Visit  Medication Sig Dispense Refill  . Adapalene-Benzoyl Peroxide (EPIDUO) 0.1-2.5 % gel Apply 1 application topically at bedtime. 45 g 2  . citalopram (CELEXA) 20 MG tablet Take 1 tablet (20 mg total) by mouth at bedtime. 90 tablet 0  . cyclobenzaprine (FLEXERIL) 10 MG tablet Take 1 tablet (10 mg total) by mouth 3 (three) times daily as needed for muscle spasms. 30 tablet 3  . ondansetron (ZOFRAN) 4 MG tablet Take 1 tablet (4 mg total) by mouth every 8 (eight) hours as needed for nausea or vomiting. 20 tablet 2  . oxcarbazepine (TRILEPTAL) 600 MG tablet TAKE 1 AND 1/2 TABLETS BY MOUTH TWICE DAILY 90 tablet 11  . topiramate (TOPAMAX) 100 MG tablet Take 3 tablets (300 mg total) by mouth at bedtime. 90 tablet 11   No current facility-administered medications on file prior to visit.      Observations/Objective:   Vitals:   08/14/20 1153  Weight: 178 lb (80.7 kg)  Height: 5\' 7"  (1.702 m)   GEN:  The patient appears stated age and is in NAD. In good spirits, more animated compared to initial visit.  Neurological examination: Patient is awake, alert. No aphasia or dysarthria. Intact fluency and comprehension. Cranial nerves: Extraocular movements intact. No facial asymmetry. Motor: moves all extremities symmetrically, at least anti-gravity x 4.    Assessment and Plan:   This is a pleasant 30 yo RH woman with a history of depression, migraines, and seizures. 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 Prior EEG in 2013 reported left frontal sharp waves, MRI degraded by motion but no acute changes seen. She has been seizure-free since June 2020 on oxcarbazepine 600mg  qhs and Topiramate 300mg  qhs. Migraines overall controlled, she had a migraine attack last month with good response to migraine cocktail. She will try prn Imitrex for migraine rescue, side effects discussed. She is aware of [place] driving laws to stop driving after a seizure until 6 months seizure-free. No current pregnancy plans. Follow-up in 6 months, she knows to call for any changes.    Follow Up Instructions:   -I discussed the assessment and treatment plan with the patient. The patient was provided  an opportunity to ask questions and all were answered. The patient agreed with the plan and demonstrated an understanding of the instructions.   The patient was advised to call back or seek an in-person evaluation if the symptoms worsen or if the condition fails to improve as anticipated.    Cameron Sprang, MD

## 2020-09-19 ENCOUNTER — Encounter (HOSPITAL_COMMUNITY): Payer: Self-pay

## 2020-09-19 ENCOUNTER — Ambulatory Visit (HOSPITAL_COMMUNITY)
Admission: EM | Admit: 2020-09-19 | Discharge: 2020-09-19 | Disposition: A | Discharge status: Home / Self Care | Payer: Medicare Other | Attending: Emergency Medicine | Admitting: Emergency Medicine

## 2020-09-19 ENCOUNTER — Other Ambulatory Visit: Payer: Self-pay

## 2020-09-19 DIAGNOSIS — L0231 Cutaneous abscess of buttock: Secondary | ICD-10-CM

## 2020-09-19 MED ORDER — LIDOCAINE-EPINEPHRINE 1 %-1:100000 IJ SOLN
INTRAMUSCULAR | Status: AC
  Filled 2020-09-19: qty 1

## 2020-09-19 MED ORDER — DOXYCYCLINE HYCLATE 100 MG PO CAPS: 100.0000 mg | ORAL_CAPSULE | Freq: Two times a day (BID) | ORAL | 0 refills | Status: DC

## 2020-09-19 NOTE — ED Provider Notes (Signed)
[facility]    CSN: [id number] Arrival date & time: 09/19/20  1017      History   Chief Complaint Chief Complaint  Patient presents with  . Abscess    HPI Lisa Shaw is a 30 y.o. female.   Patient presents with boil of left buttocks for three days, increasing in pain and size. Attempted warm soaks and to pop it with no success. Denies fever, chills, body aches. Denies discharge, itching, odor. Shaved recently. History of abscess requiring drainage.   Past Medical History:  Diagnosis Date  . Acne vulgaris   . Depression 04/29/2017  . Epilepsy (HCC)   . Migraine   . Seizures (HCC) 07/18/2016  . Sleep disturbance 04/29/2017  . Vitamin D deficiency 06/2020    Patient Active Problem List   Diagnosis Date Noted  . Acne vulgaris 06/23/2019  . Muscle strain 04/02/2019  . Nausea 04/02/2019  . History of anemia 04/02/2019  . GAD (generalized anxiety disorder) 09/05/2018  . Cannabis use disorder, mild, in sustained remission 09/05/2018  . Major depressive disorder, recurrent episode, in full remission (HCC) 02/12/2018  . Sleep disturbance 04/29/2017  . History of suicide attempt 02/11/2017  . Abdominal pain 10/29/2016  . Seizures (HCC) 10/25/2016  . Chronic headaches 05/18/2013    History reviewed. No pertinent surgical history.  OB History    Gravida  0   Para  0   Term  0   Preterm  0   AB  0   Living  0     SAB  0   IAB  0   Ectopic  0   Multiple  0   Live Births  0            Home Medications    Prior to Admission medications   Medication Sig Start Date End Date Taking? Authorizing Provider  doxycycline (VIBRAMYCIN) 100 MG capsule Take 1 capsule (100 mg total) by mouth 2 (two) times daily. 09/19/20  Yes White, Adrienne R, NP  Adapalene-Benzoyl Peroxide (EPIDUO) 0.1-2.5 % gel Apply 1 application topically at bedtime. 04/20/20   Clark-Burning, Victorino Dike, PA-C  citalopram (CELEXA) 20 MG tablet Take 1 tablet (20 mg total) by  mouth at bedtime. 07/19/20 10/17/20  Pucilowski, Roosvelt Maser, MD  cyclobenzaprine (FLEXERIL) 10 MG tablet Take 1 tablet (10 mg total) by mouth 3 (three) times daily as needed for muscle spasms. 04/02/19   Kallie Locks, FNP  ondansetron (ZOFRAN) 4 MG tablet Take 1 tablet (4 mg total) by mouth every 8 (eight) hours as needed for nausea or vomiting. 06/28/20   Kallie Locks, FNP  oxcarbazepine (TRILEPTAL) 600 MG tablet Take 1 tablet every night 08/14/20   Van Clines, MD  SUMAtriptan (IMITREX) 50 MG tablet Take 1 tablet at onset of migraine/headache. Do not take more than 2-3 a week 08/14/20   Van Clines, MD  topiramate (TOPAMAX) 100 MG tablet Take 3 tablets (300 mg total) by mouth at bedtime. 08/14/20   Van Clines, MD    Family History Family History  Problem Relation Age of Onset  . Seizures Other     Social History Social History   Tobacco Use  . Smoking status: Current Every Day Smoker    Packs/day: 0.25    Types: Cigarettes  . Smokeless tobacco: Never Used  . Tobacco comment: 6-7 cigarettes per day  Vaping Use  . Vaping Use: Never used  Substance Use Topics  . Alcohol use: No  .  Drug use: Not Currently    Types: Marijuana    Comment: trying to quit     Allergies   Patient has no known allergies.   Review of Systems Review of Systems  Constitutional: Negative.   HENT: Negative.   Respiratory: Negative.   Cardiovascular: Negative.   Gastrointestinal: Negative.   Genitourinary: Negative.   Musculoskeletal: Negative.   Skin: Positive for wound. Negative for color change, pallor and rash.  Neurological: Negative.      Physical Exam Triage Vital Signs ED Triage Vitals  Enc Vitals Group     BP 09/19/20 1100 112/74     Pulse Rate 09/19/20 1100 93     Resp 09/19/20 1100 18     Temp 09/19/20 1100 100 F (37.8 C)     Temp Source 09/19/20 1100 Oral     SpO2 09/19/20 1100 99 %     Weight --      Height --      Head Circumference --      Peak Flow --       Pain Score 09/19/20 1059 10     Pain Loc --      Pain Edu? --      Excl. in GC? --    No data found.  Updated Vital Signs BP 112/74 (BP Location: Right Arm)   Pulse 93   Temp 100 F (37.8 C) (Oral)   Resp 18   LMP 08/28/2020   SpO2 99%   Visual Acuity Right Eye Distance:   Left Eye Distance:   Bilateral Distance:    Right Eye Near:   Left Eye Near:    Bilateral Near:     Physical Exam Constitutional:      Appearance: Normal appearance. She is normal weight.  HENT:     Head: Normocephalic.  Eyes:     Extraocular Movements: Extraocular movements intact.  Pulmonary:     Effort: Pulmonary effort is normal.  Skin:         Comments: Abscess present mid left buttocks, tender, no erythema   Neurological:     Mental Status: She is alert and oriented to person, place, and time. Mental status is at baseline.  Psychiatric:        Mood and Affect: Mood normal.        Behavior: Behavior normal.        Thought Content: Thought content normal.        Judgment: Judgment normal.      UC Treatments / Results  Labs (all labs ordered are listed, but only abnormal results are displayed) Labs Reviewed - No data to display  EKG   Radiology No results found.  Procedures Incision and Drainage  Date/Time: 09/19/2020 11:29 AM Performed by: Valinda Hoar, NP Authorized by: Valinda Hoar, NP   Consent:    Consent obtained:  Verbal   Consent given by:  Patient   Risks, benefits, and alternatives were discussed: yes     Risks discussed:  Bleeding, pain and infection   Alternatives discussed:  No treatment Universal protocol:    Procedure explained and questions answered to patient or proxy's satisfaction: yes     Patient identity confirmed:  Verbally with patient Location:    Type:  Abscess   Location:  Anogenital   Anogenital location: left buttocks  Sedation:    Sedation type:  None Anesthesia:    Anesthesia method:  Local infiltration   Local  anesthetic:  Lidocaine 1% WITH epi  Procedure type:    Complexity:  Simple Procedure details:    Ultrasound guidance: no     Incision types:  Single straight   Drainage:  Purulent   Drainage amount:  Copious   Wound treatment:  Wound left open Post-procedure details:    Procedure completion:  Tolerated   (including critical care time)  Medications Ordered in UC Medications - No data to display  Initial Impression / Assessment and Plan / UC Course  I have reviewed the triage vital signs and the nursing notes.  Pertinent labs & imaging results that were available during my care of the patient were reviewed by me and considered in my medical decision making (see chart for details).  Abscess of left buttocks  1. doxycycline 100 mg bid for 7 days 2. Warm compresses at least four times a day  3. Advised exfoliation after and between shaving  4. Follow up increased swelling , pain erythema, fever chills body aches or no improvement of abscess  Final Clinical Impressions(s) / UC Diagnoses   Final diagnoses:  Abscess of buttock, left     Discharge Instructions     Take antibiotic once in the morning, once in the evening for seven days, drinks glass of water with medication and take with food if stomach becomes upset  Warm compresses or soaks at least four times a day to promote drainage  Exfoliate after shaving and in between shaves with wash cloth, apply pressure and do circular motions for a few minutes   Follow up for increased swelling, pain, drainage not decreasing by end of medication course, no improvement in site, fever, chills body,  aches begin    ED Prescriptions    Medication Sig Dispense Auth. Provider   doxycycline (VIBRAMYCIN) 100 MG capsule Take 1 capsule (100 mg total) by mouth 2 (two) times daily. 14 capsule White, Elita Boone, NP     PDMP not reviewed this encounter.   Valinda Hoar, NP 09/19/20 1210

## 2020-09-19 NOTE — ED Triage Notes (Signed)
Pt c/o boil on left buttock X 3 days. Pt states it has gotten bigger and states she tried to pop it. She states she tried soaking in her tub and did not feel relief.

## 2020-09-19 NOTE — Discharge Instructions (Addendum)
Take antibiotic once in the morning, once in the evening for seven days, drinks glass of water with medication and take with food if stomach becomes upset  Warm compresses or soaks at least four times a day to promote drainage  Exfoliate after shaving and in between shaves with wash cloth, apply pressure and do circular motions for a few minutes   Follow up for increased swelling, pain, drainage not decreasing by end of medication course, no improvement in site, fever, chills body,  aches begin

## 2020-10-11 ENCOUNTER — Ambulatory Visit: Payer: Medicare Other | Admitting: Dermatology

## 2020-11-17 ENCOUNTER — Ambulatory Visit: Payer: Medicare Other

## 2021-01-11 ENCOUNTER — Encounter: Payer: Self-pay | Admitting: Neurology

## 2021-01-11 ENCOUNTER — Ambulatory Visit (INDEPENDENT_AMBULATORY_CARE_PROVIDER_SITE_OTHER): Payer: Medicare Other | Admitting: Neurology

## 2021-01-11 ENCOUNTER — Other Ambulatory Visit: Payer: Self-pay

## 2021-01-11 VITALS — BP 104/71 | HR 95 | Ht 65.0 in | Wt 181.0 lb

## 2021-01-11 DIAGNOSIS — G43009 Migraine without aura, not intractable, without status migrainosus: Secondary | ICD-10-CM

## 2021-01-11 DIAGNOSIS — G40009 Localization-related (focal) (partial) idiopathic epilepsy and epileptic syndromes with seizures of localized onset, not intractable, without status epilepticus: Secondary | ICD-10-CM

## 2021-01-11 MED ORDER — FOLIC ACID 5 MG PO CAPS: 1.0000 | ORAL_CAPSULE | Freq: Every day | ORAL | 11 refills | Status: DC

## 2021-01-11 MED ORDER — TOPIRAMATE 100 MG PO TABS: 300.0000 mg | ORAL_TABLET | Freq: Every day | ORAL | 11 refills | Status: DC

## 2021-01-11 MED ORDER — OXCARBAZEPINE 600 MG PO TABS: ORAL_TABLET | ORAL | 11 refills | Status: DC

## 2021-01-11 NOTE — Progress Notes (Signed)
NEUROLOGY FOLLOW UP OFFICE NOTE  Lisa Shaw [id number] 10-14-1990  HISTORY OF PRESENT ILLNESS: I had the pleasure of seeing Lisa Shaw in follow-up in the neurology clinic on 01/11/2021.  The patient was last seen 5 months ago for bilateral tonic-clonic epilepsy and migraines. Prior EEG reported left frontal sharp waves. MRI brain motion-degraded but no acute changes. She is on oxcarbazepine 600mg  qhs and Topiramate 300mg  qhs without side effects. She denies any seizures since June 2020. She lives alone. She denies any staring/unresponsive episodes, gaps in time, olfactory/gustatory hallucinations, focal numbness/tingling/weakness, myoclonic jerks. The migraines have been doing good. Sleep and mood are okay. She has no pregnancy plans and is not on birth control.    History on Initial Assessment 12/02/2017: This is a pleasant 30 year old right-handed woman with a history of seizures since 2013. [facility] from [facility] and [facility] were reviewed and will be summarized here, she is depressed in the office today and is a poor historian. She initially had nocturnal seizures with described diffuse rhythmic jerking with eye rolling backwards. She had an EEG in 06/2012 reporting left frontal sharps. There is also note of episodes in 2012 where she would have staring spells lasting around 2 minutes with associated diaphoresis and post-ictal confusion with violence usually at night or early in the morning. Both seizure types were associated with tongue bite and urinary incontinence. There is report of deja vu, and she reports tingling in both hands during the "stare offs." She lives with her mother but is mostly by herself at home during the day, and feels she has the "stare off" episodes around twice a week. She was initially started on Keppra which caused mood issues, then switched to Topamax. It appears she initially had mental slowing on Topamax, but has been taking 300mg  qhs since 2015. She had  seen Dr. 2013 at [facility] last year and was started on Vimpat which she stopped again due to side effects. She reports the last nocturnal convulsion was in June 2018 when she woke up feeling weird with her tongue sore and with a headache. She was given a headache cocktail but was also noted to be very anxious and depressed. She was admitted for involuntary commitment at [facility] in July 2018 for suicidal ideation. She currently denies any suicidal ideation but continues to report significant depression. She reports body twitches in her arms or legs. She has headaches on a daily basis, taking an average of 2 Tylenol or BC powder daily. She feels it is due to "overthinking or depression." She has sleep difficulties with interrupted 5-6 hours of sleep at night. She feels fatigued and drowsy during the day. She has dizziness when standing. Vision is occasionally blurred when watching TV. She has noticed swallowing difficulties. She notices her right foot gets numb frequently and her balance is off. No falls. 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    Epilepsy Risk Factors:  Her maternal great grandmother and great aunt had seizures. Otherwise she had a normal birth and early development.  There is no history of febrile convulsions, CNS infections such as meningitis/encephalitis, significant traumatic brain injury, neurosurgical procedures.   Prior AEDs: Keppra, Vimpat Laboratory Data:  EEGs: EEG in 06/2012 reported as abnormal secondary to focal sharp wave activity in the left frontal region MRI: I personally reviewed MRI brain without contrast done 06/2012 which was degraded by motion. Lateral ventricles are mildly  prominent in size with a slightly rounded appearance, no transependymal CSF flow. Hippocampi appear symmetric with no abnormal signal seen.   PAST MEDICAL HISTORY: Past Medical History:  Diagnosis Date   Acne vulgaris     Depression 04/29/2017   Epilepsy (HCC)    Migraine    Seizures (HCC) 07/18/2016   Sleep disturbance 04/29/2017   Vitamin D deficiency 06/2020    MEDICATIONS: Current Outpatient Medications on File Prior to Visit  Medication Sig Dispense Refill   Adapalene-Benzoyl Peroxide (EPIDUO) 0.1-2.5 % gel Apply 1 application topically at bedtime. 45 g 2   citalopram (CELEXA) 20 MG tablet Take 1 tablet (20 mg total) by mouth at bedtime. 90 tablet 0   ondansetron (ZOFRAN) 4 MG tablet Take 1 tablet (4 mg total) by mouth every 8 (eight) hours as needed for nausea or vomiting. 20 tablet 2   oxcarbazepine (TRILEPTAL) 600 MG tablet Take 1 tablet every night 90 tablet 3   topiramate (TOPAMAX) 100 MG tablet Take 3 tablets (300 mg total) by mouth at bedtime. 90 tablet 11   cyclobenzaprine (FLEXERIL) 10 MG tablet Take 1 tablet (10 mg total) by mouth 3 (three) times daily as needed for muscle spasms. (Patient not taking: Reported on 01/11/2021) 30 tablet 3   SUMAtriptan (IMITREX) 50 MG tablet Take 1 tablet at onset of migraine/headache. Do not take more than 2-3 a week (Patient not taking: Reported on 01/11/2021) 10 tablet 11   No current facility-administered medications on file prior to visit.    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   High school education   Right handed    Social Determinants of Health   Financial Resource Strain: Not on file  Food Insecurity: Not on file  Transportation Needs: Not on file  Physical Activity: Not on file  Stress: Not on file  Social Connections: Not on file  Intimate Partner Violence: Not on file     PHYSICAL EXAM: Vitals:   01/11/21 1004  BP: 104/71  Pulse: 95  SpO2: 100%   General: No acute distress Head:  Normocephalic/atraumatic Skin/Extremities: No rash, no edema Neurological Exam: alert and awake. No aphasia or dysarthria. Fund of knowledge is appropriate. Attention and concentration are normal.   Cranial nerves: Pupils equal, round. Extraocular movements intact with no nystagmus. Visual fields full.  No facial asymmetry.  Motor: Bulk and tone normal, muscle strength 5/5 throughout with no pronator drift.  Finger to nose testing intact.  Gait narrow-based and steady, able to tandem walk adequately.  Romberg negative.   IMPRESSION: This is a pleasant 30 yo RH woman with a history of depression, migraines, and seizures. 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 Prior EEG in 2013 reported left frontal sharp waves, MRI degraded by motion but no acute changes seen. She has been seizure-free since June 2020 on oxcarbazepine 600mg  qhs and Topiramate 300mg  qhs without side effects. We discussed immediate-release formulations are usually taken BID, however since she has been doing well seizure-free with no side effects, we have agreed to continue current regimen. Migraines overall controlled.We discussed issues in women with epilepsy, she will discuss birth control with her gynecologist and start daily folic acid. She is aware of [place]  driving laws to stop driving after a seizure until 6 months seizure-free. Follow-up in 6 months, call for any changes.   Thank you for allowing me to participate in her care.  Please do not hesitate to call for any questions or concerns.    , M.D.   CC: , NP

## 2021-01-11 NOTE — Patient Instructions (Signed)
Continue Topiramate 100mg : Take 3 tablets every night  2. Continue oxcarbazepine 600mg : take 1 tablet every night  3. Start a daily folic acid 5mg  tablet  4. Discuss birth control options with your gynecologist  5. Follow-up in 6 months,call for any changes   Seizure Precautions: 1. If medication has been prescribed for you to prevent seizures, take it exactly as directed.  Do not stop taking the medicine without talking to your doctor first, even if you have not had a seizure in a long time.   2. Avoid activities in which a seizure would cause danger to yourself or to others.  Don't operate dangerous machinery, swim alone, or climb in high or dangerous places, such as on ladders, roofs, or girders.  Do not drive unless your doctor says you may.  3. If you have any warning that you may have a seizure, lay down in a safe place where you can't hurt yourself.    4.  No driving for 6 months from last seizure, as per [facility].   Please refer to the following link on the [employer]ile[employer] of [employer]'s website for more information: http://www.epilepsyfoundation.org/answerplace/Social/driving/drivingu.cfm   5.  Maintain good sleep hygiene. Avoid alcohol.  6.  Notify your neurology if you are planning pregnancy or if you become pregnant.  7.  Contact your doctor if you have any problems that may be related to the medicine you are taking.  8.  Call 911 and bring the patient back to the ED if:        A.  The seizure lasts longer than 5 minutes.       B.  The patient doesn't awaken shortly after the seizure  C.  The patient has new problems such as difficulty seeing, speaking or moving  D.  The patient was injured during the seizure  E.  The patient has a temperature over 102 F (39C)  F.  The patient vomited and now is having trouble breathing

## 2021-02-19 ENCOUNTER — Telehealth: Payer: Self-pay

## 2021-02-19 ENCOUNTER — Emergency Department (HOSPITAL_COMMUNITY)
Admission: EM | Admit: 2021-02-19 | Discharge: 2021-02-19 | Disposition: A | Discharge status: Home / Self Care | Payer: Medicare Other | Attending: Emergency Medicine | Admitting: Emergency Medicine

## 2021-02-19 ENCOUNTER — Telehealth: Payer: Medicare Other | Admitting: Physician Assistant

## 2021-02-19 ENCOUNTER — Encounter (HOSPITAL_COMMUNITY): Payer: Self-pay | Admitting: *Deleted

## 2021-02-19 ENCOUNTER — Other Ambulatory Visit: Payer: Self-pay

## 2021-02-19 DIAGNOSIS — B9789 Other viral agents as the cause of diseases classified elsewhere: Secondary | ICD-10-CM | POA: Diagnosis not present

## 2021-02-19 DIAGNOSIS — U071 COVID-19: Secondary | ICD-10-CM | POA: Diagnosis not present

## 2021-02-19 DIAGNOSIS — R059 Cough, unspecified: Secondary | ICD-10-CM | POA: Diagnosis present

## 2021-02-19 DIAGNOSIS — Z20822 Contact with and (suspected) exposure to covid-19: Secondary | ICD-10-CM

## 2021-02-19 DIAGNOSIS — J069 Acute upper respiratory infection, unspecified: Secondary | ICD-10-CM | POA: Diagnosis not present

## 2021-02-19 DIAGNOSIS — F17210 Nicotine dependence, cigarettes, uncomplicated: Secondary | ICD-10-CM | POA: Insufficient documentation

## 2021-02-19 LAB — SARS CORONAVIRUS 2 (TAT 6-24 HRS): SARS Coronavirus 2: POSITIVE — AB

## 2021-02-19 MED ORDER — FLUTICASONE PROPIONATE 50 MCG/ACT NA SUSP: 1.0000 | Freq: Every day | NASAL | 0 refills | Status: DC

## 2021-02-19 MED ORDER — BENZONATATE 100 MG PO CAPS: 100.0000 mg | ORAL_CAPSULE | Freq: Three times a day (TID) | ORAL | 0 refills | Status: DC

## 2021-02-19 NOTE — Discharge Instructions (Addendum)
We will contact you with the results of your COVID test if it is positive when it is available. You can also check on MyChart. Take the medications as needed to help with your symptoms. Return to the ER if you start to experience worsening symptoms, chest pain, shortness of breath, uncontrollable vomiting.

## 2021-02-19 NOTE — ED Provider Notes (Signed)
[facility]-EMERGENCY DEPT Provider Note   CSN: [id number] Arrival date & time: 02/19/21  4098     History Chief Complaint  Patient presents with   Cough   Sore Throat    Lisa Shaw is a 30 y.o. female with a past medical history of epilepsy presenting to the ED for cough, sore throat, congestion, rhinorrhea.  Symptoms have been going on for the past 4 days.  She was exposed to several family members that have COVID.  She is concerned for the same.  She has tried over-the-counter medications such as TheraFlu, NyQuil, DayQuil with some improvement in her symptoms.  She denies any chest pain, shortness of breath or vomiting.  HPI     Past Medical History:  Diagnosis Date   Acne vulgaris    Depression 04/29/2017   Epilepsy (HCC)    Migraine    Seizures (HCC) 07/18/2016   Sleep disturbance 04/29/2017   Vitamin D deficiency 06/2020    Patient Active Problem List   Diagnosis Date Noted   Acne vulgaris 06/23/2019   Muscle strain 04/02/2019   Nausea 04/02/2019   History of anemia 04/02/2019   GAD (generalized anxiety disorder) 09/05/2018   Cannabis use disorder, mild, in sustained remission 09/05/2018   Major depressive disorder, recurrent episode, in full remission (HCC) 02/12/2018   Sleep disturbance 04/29/2017   History of suicide attempt 02/11/2017   Abdominal pain 10/29/2016   Seizures (HCC) 10/25/2016   Chronic headaches 05/18/2013    History reviewed. No pertinent surgical history.   OB History     Gravida  0   Para  0   Term  0   Preterm  0   AB  0   Living  0      SAB  0   IAB  0   Ectopic  0   Multiple  0   Live Births  0           Family History  Problem Relation Age of Onset   Seizures Other     Social History   Tobacco Use   Smoking status: Every Day    Packs/day: 0.25    Types: Cigarettes   Smokeless tobacco: Never   Tobacco comments:    6-7 cigarettes per day  Vaping Use   Vaping Use:  Never used  Substance Use Topics   Alcohol use: No   Drug use: Not Currently    Types: Marijuana    Comment: trying to quit    Home Medications Prior to Admission medications   Medication Sig Start Date End Date Taking? Authorizing Provider  benzonatate (TESSALON) 100 MG capsule Take 1 capsule (100 mg total) by mouth every 8 (eight) hours. 02/19/21  Yes Yohann Curl, PA-C  fluticasone (FLONASE) 50 MCG/ACT nasal spray Place 1 spray into both nostrils daily. 02/19/21  Yes Nissi Doffing, PA-C  Adapalene-Benzoyl Peroxide (EPIDUO) 0.1-2.5 % gel Apply 1 application topically at bedtime. 04/20/20   Clark-Burning, Victorino Dike, PA-C  citalopram (CELEXA) 20 MG tablet Take 1 tablet (20 mg total) by mouth at bedtime. 07/19/20 01/11/21  Pucilowski, Roosvelt Maser, MD  cyclobenzaprine (FLEXERIL) 10 MG tablet Take 1 tablet (10 mg total) by mouth 3 (three) times daily as needed for muscle spasms. Patient not taking: Reported on 01/11/2021 04/02/19   Kallie Locks, FNP  Folic Acid 5 MG CAPS Take 1 capsule (5 mg total) by mouth daily. 01/11/21   Van Clines, MD  ondansetron (ZOFRAN) 4 MG tablet  Take 1 tablet (4 mg total) by mouth every 8 (eight) hours as needed for nausea or vomiting. 06/28/20   Kallie Locks, FNP  oxcarbazepine (TRILEPTAL) 600 MG tablet Take 1 tablet every night 01/11/21   Van Clines, MD  SUMAtriptan (IMITREX) 50 MG tablet Take 1 tablet at onset of migraine/headache. Do not take more than 2-3 a week Patient not taking: Reported on 01/11/2021 08/14/20   Van Clines, MD  topiramate (TOPAMAX) 100 MG tablet Take 3 tablets (300 mg total) by mouth at bedtime. 01/11/21   Van Clines, MD    Allergies    Patient has no known allergies.  Review of Systems   Review of Systems  Constitutional:  Positive for chills. Negative for fever.  HENT:  Positive for congestion and sore throat. Negative for sinus pressure and trouble swallowing.   Respiratory:  Positive for cough. Negative for shortness of  breath.   Gastrointestinal:  Negative for nausea and vomiting.  Musculoskeletal:  Positive for myalgias.   Physical Exam Updated Vital Signs BP 120/87 (BP Location: Right Arm)   Pulse 91   Temp 98.4 F (36.9 C) (Oral)   Resp 17   LMP 01/31/2021   SpO2 100%   Physical Exam Vitals and nursing note reviewed.  Constitutional:      General: She is not in acute distress.    Appearance: She is well-developed. She is not diaphoretic.  HENT:     Head: Normocephalic and atraumatic.     Mouth/Throat:     Tonsils: No tonsillar exudate.  Eyes:     General: No scleral icterus.    Conjunctiva/sclera: Conjunctivae normal.  Cardiovascular:     Rate and Rhythm: Normal rate and regular rhythm.     Heart sounds: Normal heart sounds.  Pulmonary:     Effort: Pulmonary effort is normal. No respiratory distress.     Breath sounds: Normal breath sounds.  Musculoskeletal:     Cervical back: Normal range of motion.  Skin:    Findings: No rash.  Neurological:     Mental Status: She is alert.    ED Results / Procedures / Treatments   Labs (all labs ordered are listed, but only abnormal results are displayed) Labs Reviewed  SARS CORONAVIRUS 2 (TAT 6-24 HRS)    EKG None  Radiology No results found.  Procedures Procedures   Medications Ordered in ED Medications - No data to display  ED Course  I have reviewed the triage vital signs and the nursing notes.  Pertinent labs & imaging results that were available during my care of the patient were reviewed by me and considered in my medical decision making (see chart for details).    MDM Rules/Calculators/A&P                           ARYAN BELLO was evaluated in Emergency Department on 02/19/21 for the symptoms described in the history of present illness. He/she was evaluated in the context of the global COVID-19 pandemic, which necessitated consideration that the patient might be at risk for infection with the SARS-CoV-2  virus that causes COVID-19. Institutional protocols and algorithms that pertain to the evaluation of patients at risk for COVID-19 are in a state of rapid change based on information released by regulatory bodies including the CDC and federal and state organizations. These policies and algorithms were followed during the patient's care in the ED.  30 year old female presenting to  the ED for 4-day history of cough, sore throat, myalgias, rhinorrhea.  Sick contacts at home that have had COVID that she has been exposed to.  She is been taking over-the-counter cold and flu medications with improvement.  On exam lungs are clear.  She is afebrile here, other vital signs within normal limits, she is not hypoxic.  Will obtain COVID testing and have her follow-up with results.  Suspect symptoms are viral in nature.  Will give symptomatic treatment at home advise increase hydration and PCP follow-up.  Return precautions given.   Patient is hemodynamically stable, in NAD, and able to ambulate in the ED. Evaluation does not show pathology that would require ongoing emergent intervention or inpatient treatment. I explained the diagnosis to the patient. Pain has been managed and has no complaints prior to discharge. Patient is comfortable with above plan and is stable for discharge at this time. All questions were answered prior to disposition. Strict return precautions for returning to the ED were discussed. Encouraged follow up with PCP.   An After Visit Summary was printed and given to the patient.   Portions of this note were generated with Scientist, clinical (histocompatibility and immunogenetics). Dictation errors may occur despite best attempts at proofreading.  Final Clinical Impression(s) / ED Diagnoses Final diagnoses:  Viral URI with cough  Encounter for laboratory testing for COVID-19 virus    Rx / DC Orders ED Discharge Orders          Ordered    benzonatate (TESSALON) 100 MG capsule  Every 8 hours        02/19/21 1059     fluticasone (FLONASE) 50 MCG/ACT nasal spray  Daily        02/19/21 1059             Dietrich Pates, PA-C 02/19/21 1100    Koleen Distance, MD 02/19/21 1225

## 2021-02-19 NOTE — ED Triage Notes (Signed)
Pt complains of cough, sore throat. She reports grandfather has COVID and she has been exposed to him a few days ago.

## 2021-02-19 NOTE — Progress Notes (Signed)
Patient was seen in UC this morning for same symptoms. Patient was notified x 2 and provider waited over 10 minutes without any attempt to connect. Marked no show.

## 2021-02-19 NOTE — Telephone Encounter (Signed)
Patient states that she has been trying to reach office this morning. Patient states she was diagnosed with covid and would like to speak with her provider. Patient was advised to seek care at [facility] OR ED.  Patient wants to speak with provider first. Attempted to contact office several times with patient on phone with no success. Patient also given information to do a virtual visit.  CB: [phone].

## 2021-02-20 ENCOUNTER — Telehealth: Payer: Self-pay | Admitting: Neurology

## 2021-02-20 ENCOUNTER — Telehealth (HOSPITAL_COMMUNITY): Payer: Self-pay | Admitting: Nurse Practitioner

## 2021-02-20 NOTE — Telephone Encounter (Signed)
Pt called in and is wanting to know if [facility] can do anything for her. She went to er yesterday and tested positive for covid. She cant see her pcp unitl 8/22. She wants to know if she needs to go back to er, is there anything she needs to take? I asked what treatment she was referring to and she said she didn't know, she just knows she has covid and cant do anything. [phone]

## 2021-02-20 NOTE — Telephone Encounter (Signed)
Pt called to inform she is covid positive since 02/19/21 currently having body pain, congestion, diarrhea, cough, runny nose. Pt is isolating, resting and taking fluids. Pt asking if she could get [facility] [address]. Please advise and thank you

## 2021-02-20 NOTE — Telephone Encounter (Signed)
Patient advised to contact her PCP. Voiced understanding.

## 2021-02-20 NOTE — Telephone Encounter (Signed)
She will need to contact her PCP again, looks like they tried to do a video visit with her yesterday. Pls have her call and let them know she has COVID and ask what she needs to do, if any. Thanks

## 2021-02-21 ENCOUNTER — Telehealth: Payer: Medicare Other | Admitting: Physician Assistant

## 2021-02-21 DIAGNOSIS — U071 COVID-19: Secondary | ICD-10-CM

## 2021-02-21 MED ORDER — MOLNUPIRAVIR EUA 200MG CAPSULE: 4.0000 | ORAL_CAPSULE | Freq: Two times a day (BID) | ORAL | 0 refills | Status: AC

## 2021-02-21 NOTE — Telephone Encounter (Signed)
Patient was seen at the [facility] and was recommended a medication to take Paxlovid but it may interfere with her seizure medication.  Today, is the last day she can get the medication and she needs to be sure she can pick it up.  Call: [phone], her mom's phone.

## 2021-02-21 NOTE — Patient Instructions (Signed)
Lisa Shaw, thank you for joining Lisa Loveless, PA-C for today's virtual visit.  While this provider is not your primary care provider (PCP), if your PCP is located in our provider database this encounter information will be shared with them immediately following your visit.  Consent: (Patient) Lisa Shaw provided verbal consent for this virtual visit at the beginning of the encounter.  Current Medications:  Current Outpatient Medications:    molnupiravir EUA 200 mg CAPS, Take 4 capsules (800 mg total) by mouth 2 (two) times daily for 5 days., Disp: 40 capsule, Rfl: 0   Adapalene-Benzoyl Peroxide (EPIDUO) 0.1-2.5 % gel, Apply 1 application topically at bedtime., Disp: 45 g, Rfl: 2   benzonatate (TESSALON) 100 MG capsule, Take 1 capsule (100 mg total) by mouth every 8 (eight) hours., Disp: 21 capsule, Rfl: 0   citalopram (CELEXA) 20 MG tablet, Take 1 tablet (20 mg total) by mouth at bedtime., Disp: 90 tablet, Rfl: 0   cyclobenzaprine (FLEXERIL) 10 MG tablet, Take 1 tablet (10 mg total) by mouth 3 (three) times daily as needed for muscle spasms. (Patient not taking: Reported on 01/11/2021), Disp: 30 tablet, Rfl: 3   fluticasone (FLONASE) 50 MCG/ACT nasal spray, Place 1 spray into both nostrils daily., Disp: 16 g, Rfl: 0   Folic Acid 5 MG CAPS, Take 1 capsule (5 mg total) by mouth daily., Disp: 30 capsule, Rfl: 11   ondansetron (ZOFRAN) 4 MG tablet, Take 1 tablet (4 mg total) by mouth every 8 (eight) hours as needed for nausea or vomiting., Disp: 20 tablet, Rfl: 2   oxcarbazepine (TRILEPTAL) 600 MG tablet, Take 1 tablet every night, Disp: 30 tablet, Rfl: 11   SUMAtriptan (IMITREX) 50 MG tablet, Take 1 tablet at onset of migraine/headache. Do not take more than 2-3 a week (Patient not taking: Reported on 01/11/2021), Disp: 10 tablet, Rfl: 11   topiramate (TOPAMAX) 100 MG tablet, Take 3 tablets (300 mg total) by mouth at bedtime., Disp: 90 tablet, Rfl: 11   Medications ordered  in this encounter:  Meds ordered this encounter  Medications   molnupiravir EUA 200 mg CAPS    Sig: Take 4 capsules (800 mg total) by mouth 2 (two) times daily for 5 days.    Dispense:  40 capsule    Refill:  0    Order Specific Question:   Supervising Provider    Answer:   Hyacinth Meeker, BRIAN [[id number]]     *If you need refills on other medications prior to your next appointment, please contact your pharmacy*  Follow-Up: Call back or seek an in-person evaluation if the symptoms worsen or if the condition fails to improve as anticipated.  Other Instructions See below information on Molnupiravir   If you have been instructed to have an in-person evaluation today at a local Urgent Care facility, please use the link below. It will take you to a list of all of our available [facility], including address, phone number and hours of operation. Please do not delay care.  [facility]  If you or a family member do not have a primary care provider, use the link below to schedule a visit and establish care. When you choose a [facility] primary care physician or advanced practice provider, you gain a long-term partner in health. Find a Primary Care Provider  Learn more about [facility] in-office and virtual care options: [facility] - Get Care Now  You are being prescribed MOLNUPIRAVIR for COVID-19  infection.   Please call the pharmacy or go through the drive through vs going inside if you are picking up the mediation yourself to prevent further spread. If prescribed to a [facility] affiliated pharmacy, a pharmacist will bring the medication out to your car.   ADMINISTRATION INSTRUCTIONS: Take with or without food. Swallow the tablets whole. Don't chew, crush, or break the medications because it might not work as well  For each dose of the medication, you should be taking FOUR tablets at one time, TWICE a day   Finish your full five-day course of Molnupiravir even if  you feel better before you're done. Stopping this medication too early can make it less effective to prevent severe illness related to COVID19.    Molnupiravir is prescribed for YOU ONLY. Don't share it with others, even if they have similar symptoms as you. This medication might not be right for everyone.   Make sure to take steps to protect yourself and others while you're taking this medication in order to get well soon and to prevent others from getting sick with COVID-19.   **If you are of childbearing potential (any gender) - it is advised to not get pregnant while taking this medication and recommended that condoms are used for female partners the next 3 months after taking the medication out of extreme caution    COMMON SIDE EFFECTS: Diarrhea Nausea  Dizziness    If your COVID-19 symptoms get worse, get medical help right away. Call 911 if you experience symptoms such as worsening cough, trouble breathing, chest pain that doesn't go away, confusion, a hard time staying awake, and pale or blue-colored skin. This medication won't prevent all COVID-19 cases from getting worse.

## 2021-02-21 NOTE — Progress Notes (Signed)
Virtual Visit Consent   Lisa Shaw, you are scheduled for a virtual visit with a [facility] provider today.     Just as with appointments in the office, your consent must be obtained to participate.  Your consent will be active for this visit and any virtual visit you may have with one of our providers in the next 365 days.     If you have a MyChart account, a copy of this consent can be sent to you electronically.  All virtual visits are billed to your insurance company just like a traditional visit in the office.    As this is a virtual visit, video technology does not allow for your provider to perform a traditional examination.  This may limit your provider's ability to fully assess your condition.  If your provider identifies any concerns that need to be evaluated in person or the need to arrange testing (such as labs, EKG, etc.), we will make arrangements to do so.     Although advances in technology are sophisticated, we cannot ensure that it will always work on either your end or our end.  If the connection with a video visit is poor, the visit may have to be switched to a telephone visit.  With either a video or telephone visit, we are not always able to ensure that we have a secure connection.     I need to obtain your verbal consent now.   Are you willing to proceed with your visit today?    Lisa Shaw has provided verbal consent on 02/21/2021 for a virtual visit (video or telephone).   Margaretann Loveless, PA-C   Date: 02/21/2021 5:05 PM   Virtual Visit via Video Note   I, Margaretann Loveless, connected with  Lisa Shaw  ([id number], January 22, 1991) on 02/21/21 at  4:30 PM EDT by a video-enabled telemedicine application and verified that I am speaking with the correct person using two identifiers.  Location: Patient: Virtual Visit Location Patient: Home Provider: Virtual Visit Location Provider: Home Office   I discussed the limitations of evaluation and  management by telemedicine and the availability of in person appointments. The patient expressed understanding and agreed to proceed.    History of Present Illness: Lisa Shaw is a 30 y.o. who identifies as a female who was assigned female at birth, and is being seen today for Covid 29. She is on day 5 of symptom onset and was prescribed Paxlovid. She was told to contact her neurologist as there may be some interactions with Paxlovid and her Oxcarbazepine, decreasing her seizure threshold. She attempted to contact her Neurologist, but has not heard back from them.   She does report that her Covid symptoms are improving at this time.    Problems:  Patient Active Problem List   Diagnosis Date Noted   Acne vulgaris 06/23/2019   Muscle strain 04/02/2019   Nausea 04/02/2019   History of anemia 04/02/2019   GAD (generalized anxiety disorder) 09/05/2018   Cannabis use disorder, mild, in sustained remission 09/05/2018   Major depressive disorder, recurrent episode, in full remission (HCC) 02/12/2018   Sleep disturbance 04/29/2017   History of suicide attempt 02/11/2017   Abdominal pain 10/29/2016   Seizures (HCC) 10/25/2016   Chronic headaches 05/18/2013    Allergies: No Known Allergies Medications:  Current Outpatient Medications:    molnupiravir EUA 200 mg CAPS, Take 4 capsules (800 mg total) by mouth 2 (two) times daily for 5 days.,  Disp: 40 capsule, Rfl: 0   Adapalene-Benzoyl Peroxide (EPIDUO) 0.1-2.5 % gel, Apply 1 application topically at bedtime., Disp: 45 g, Rfl: 2   benzonatate (TESSALON) 100 MG capsule, Take 1 capsule (100 mg total) by mouth every 8 (eight) hours., Disp: 21 capsule, Rfl: 0   citalopram (CELEXA) 20 MG tablet, Take 1 tablet (20 mg total) by mouth at bedtime., Disp: 90 tablet, Rfl: 0   cyclobenzaprine (FLEXERIL) 10 MG tablet, Take 1 tablet (10 mg total) by mouth 3 (three) times daily as needed for muscle spasms. (Patient not taking: Reported on 01/11/2021),  Disp: 30 tablet, Rfl: 3   fluticasone (FLONASE) 50 MCG/ACT nasal spray, Place 1 spray into both nostrils daily., Disp: 16 g, Rfl: 0   Folic Acid 5 MG CAPS, Take 1 capsule (5 mg total) by mouth daily., Disp: 30 capsule, Rfl: 11   ondansetron (ZOFRAN) 4 MG tablet, Take 1 tablet (4 mg total) by mouth every 8 (eight) hours as needed for nausea or vomiting., Disp: 20 tablet, Rfl: 2   oxcarbazepine (TRILEPTAL) 600 MG tablet, Take 1 tablet every night, Disp: 30 tablet, Rfl: 11   SUMAtriptan (IMITREX) 50 MG tablet, Take 1 tablet at onset of migraine/headache. Do not take more than 2-3 a week (Patient not taking: Reported on 01/11/2021), Disp: 10 tablet, Rfl: 11   topiramate (TOPAMAX) 100 MG tablet, Take 3 tablets (300 mg total) by mouth at bedtime., Disp: 90 tablet, Rfl: 11  Observations/Objective: Patient is well-developed, well-nourished in no acute distress.  Resting comfortably at home.  Head is normocephalic, atraumatic.  No labored breathing.  Speech is clear and coherent with logical content.  Patient is alert and oriented at baseline.    Assessment and Plan: 1. COVID-19 - molnupiravir EUA 200 mg CAPS; Take 4 capsules (800 mg total) by mouth 2 (two) times daily for 5 days.  Dispense: 40 capsule; Refill: 0  - Advised patient to not take Paxlovid  - Discussed since symptoms are improving and she is at Day 5 she could just continue symptomatic management - Discussed Molunpiravir and possibility for birth defects; she reports she understands and does not feel that would be a problem as she is not currently sexually active. She would like to proceed with antiviral treatment despite symptom improvement and risk of side effects - Molnupiravir prescribed for patient - Continue symptomatic management as needed  Follow Up Instructions: I discussed the assessment and treatment plan with the patient. The patient was provided an opportunity to ask questions and all were answered. The patient agreed with  the plan and demonstrated an understanding of the instructions.  A copy of instructions were sent to the patient via MyChart.  The patient was advised to call back or seek an in-person evaluation if the symptoms worsen or if the condition fails to improve as anticipated.  Time:  I spent 14 minutes with the patient via telehealth technology discussing the above problems/concerns.    Margaretann Loveless, PA-C

## 2021-02-21 NOTE — Telephone Encounter (Signed)
Contacted patient [facility]

## 2021-02-22 NOTE — Telephone Encounter (Signed)
Molnupiravir is okay to take. Would ask her PCP about the over the counter medications, thanks

## 2021-02-22 NOTE — Telephone Encounter (Signed)
Patient advised to contact PCP

## 2021-02-22 NOTE — Telephone Encounter (Signed)
Pt called back in wanting to speak with someone. She is not going to take the Paxlovid. They gave her molnupiravir instead. She wants to make sure that one is not going to interfere with her seizures. Also should she stop all of her over the counter medications (dayquill and nyquill) and just take the molnupiravir? She says her phone has been acting up, so if she doesn't answer then call her mom at [phone].

## 2021-03-05 ENCOUNTER — Other Ambulatory Visit: Payer: Self-pay

## 2021-03-05 ENCOUNTER — Telehealth (INDEPENDENT_AMBULATORY_CARE_PROVIDER_SITE_OTHER): Payer: Medicare Other | Admitting: Nurse Practitioner

## 2021-03-05 ENCOUNTER — Telehealth: Payer: Self-pay | Admitting: Nurse Practitioner

## 2021-03-05 DIAGNOSIS — Z8616 Personal history of COVID-19: Secondary | ICD-10-CM

## 2021-03-05 NOTE — Progress Notes (Signed)
   [facility] Patient [facility] [address] [place], [place]  [address] Phone:  [phone]   Fax:  [phone] Virtual Visit via Telephone Note  I connected with Lisa Shaw on 03/06/21 at  3:40 PM EDT by telephone and verified that I am speaking with the correct person using two identifiers.   I discussed the limitations, risks, security and privacy concerns of performing an evaluation and management service by telephone and the availability of in person appointments. I also discussed with the patient that there may be a patient responsible charge related to this service. The patient expressed understanding and agreed to proceed.  Patient home Provider Office  History of Present Illness:  Lisa Shaw  has a past medical history of Acne vulgaris, Depression (04/29/2017), Epilepsy (HCC), Migraine, Seizures (HCC) (07/18/2016), Sleep disturbance (04/29/2017), and Vitamin D deficiency (06/2020).   She had COVID in August 2022. She reports that she is doing better now. Denies headache, dizziness, visual changes, shortness of breath, dyspnea on exertion, chest pain, nausea, vomiting or any edema.    ROS   Observations/Objective: No exam; telephone visit  Assessment and Plan: 1. Personal history of COVID-19 Encourage continue safety precautions Discussed COVID booster vaccination   Follow Up Instructions:   Is already scheduled to include annual exam    I discussed the assessment and treatment plan with the patient. The patient was provided an opportunity to ask questions and all were answered. The patient agreed with the plan and demonstrated an understanding of the instructions.   The patient was advised to call back or seek an in-person evaluation if the symptoms worsen or if the condition fails to improve as anticipated.  I provided 5 minutes of telephone- visit time during this encounter.   Barbette Merino, NP

## 2021-03-06 ENCOUNTER — Encounter: Payer: Self-pay | Admitting: Nurse Practitioner

## 2021-03-09 ENCOUNTER — Ambulatory Visit: Payer: Self-pay | Admitting: Nurse Practitioner

## 2021-03-25 ENCOUNTER — Ambulatory Visit: Payer: Self-pay

## 2021-03-25 ENCOUNTER — Telehealth: Payer: Self-pay

## 2021-03-25 NOTE — Telephone Encounter (Signed)
Patient returned call and accepts AWV via telephone visit 03/26/21 @ 9am  Valarie Cones

## 2021-03-25 NOTE — Telephone Encounter (Signed)
03/25/2021 Name: PRISTINE GLADHILL MRN: [id number] DOB: July 28, 1990  Unsuccessful outbound call made today to assist with:   scheduling AWV  Outreach Attempt:  1st Attempt  A HIPAA compliant voice message was left requesting a return call.  Instructed patient to call back at [phone].  Valarie Cones

## 2021-03-26 ENCOUNTER — Ambulatory Visit (INDEPENDENT_AMBULATORY_CARE_PROVIDER_SITE_OTHER): Payer: Medicare Other

## 2021-03-26 ENCOUNTER — Other Ambulatory Visit: Payer: Self-pay

## 2021-03-26 ENCOUNTER — Ambulatory Visit: Payer: Self-pay

## 2021-03-26 DIAGNOSIS — Z0000 Encounter for general adult medical examination without abnormal findings: Secondary | ICD-10-CM | POA: Diagnosis not present

## 2021-03-26 NOTE — Progress Notes (Addendum)
I, Barbette Merino, NP, have reviewed all documentation for this visit. The documentation is complete.   Subjective:   Lisa Shaw is a 30 y.o. female who presents for Medicare Annual (Subsequent) preventive examination. I connected with  Lisa Shaw on 03/26/21 by a audio enabled telemedicine application.   I connected with Lisa Shaw on 03/26/21 by an audio enabled telemedicine application and verified that I am speaking with the correct person using two identifiers.   I discussed the limitations, risks, security and privacy concerns of performing an evaluation and management service by telephone and the availability of in person appointments.   Location of Patient: Home Location of Provider: Office  List any persons and their roles That are participating in the visit with the patient. [name], LPN       Review of Systems    Defer to PCP       Objective:    There were no vitals filed for this visit. There is no height or weight on file to calculate BMI.  Advanced Directives 01/11/2021 08/14/2020 10/11/2019 01/07/2019 12/30/2018 07/07/2018 05/14/2018  Does Patient Have a Medical Advance Directive? No No No No No No No  Would patient like information on creating a medical advance directive? - - - - Yes (ED - Information included in AVS) No - Patient declined -  Some encounter information is confidential and restricted. Go to Review Flowsheets activity to see all data.    Current Medications (verified) Outpatient Encounter Medications as of 03/26/2021  Medication Sig   Adapalene-Benzoyl Peroxide (EPIDUO) 0.1-2.5 % gel Apply 1 application topically at bedtime.   benzonatate (TESSALON) 100 MG capsule Take 1 capsule (100 mg total) by mouth every 8 (eight) hours.   citalopram (CELEXA) 20 MG tablet Take 1 tablet (20 mg total) by mouth at bedtime.   cyclobenzaprine (FLEXERIL) 10 MG tablet Take 1 tablet (10 mg total) by mouth 3 (three) times daily as needed for  muscle spasms.   Folic Acid 5 MG CAPS Take 1 capsule (5 mg total) by mouth daily. (Patient not taking: Reported on 03/05/2021)   ondansetron (ZOFRAN) 4 MG tablet Take 1 tablet (4 mg total) by mouth every 8 (eight) hours as needed for nausea or vomiting.   oxcarbazepine (TRILEPTAL) 600 MG tablet Take 1 tablet every night   topiramate (TOPAMAX) 100 MG tablet Take 3 tablets (300 mg total) by mouth at bedtime.   No facility-administered encounter medications on file as of 03/26/2021.    Allergies (verified) Patient has no known allergies.   History: Past Medical History:  Diagnosis Date   Acne vulgaris    Depression 04/29/2017   Epilepsy (HCC)    Migraine    Seizures (HCC) 07/18/2016   Sleep disturbance 04/29/2017   Vitamin D deficiency 06/2020   No past surgical history on file. 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   High school education   Right handed    Social Determinants of Health   Financial Resource Strain: Not on file  Food Insecurity: Not on file  Transportation Needs: Not on file  Physical Activity: Not on file  Stress: Not on file  Social Connections: Not on file    Tobacco Counseling Ready to quit:  Not Answered Counseling given: Not Answered Tobacco comments: 6-7 cigarettes per day   Clinical Intake:                 Diabetic?No         Activities of Daily Living No flowsheet data found.  Patient Care Team: Barbette MerinoKing, Crystal M, NP as PCP - General (Adult Health Nurse Practitioner) Van ClinesAquino, Karen M, MD as Consulting Physician (Neurology) Clark-Burning, Victorino DikeJennifer, PA-C (Inactive) (Dermatology)  Indicate any recent Medical Services you may have received from other than [facility] providers in the past year (date may be approximate).     Assessment:   This is a routine wellness examination for [name].  Hearing/Vision screen No results found.  Dietary issues and exercise activities discussed:     Goals Addressed   None   Depression Screen PHQ 2/9 Scores 03/05/2021 12/28/2019 06/22/2019 04/02/2019 04/15/2017 01/28/2017 12/12/2016  PHQ - 2 Score 0 1 0 0 6 6 0  PHQ- 9 Score 0 1 - - 19 22 -    Fall Risk Fall Risk  03/05/2021 01/11/2021 08/14/2020 10/11/2019 06/22/2019  Falls in the past year? 0 0 0 0 0  Number falls in past yr: 0 0 0 0 0  Injury with Fall? 0 0 0 0 0  Follow up - - - - -    FALL RISK PREVENTION PERTAINING TO THE HOME:  Any stairs in or around the home? No  If so, are there any without handrails? No  Home free of loose throw rugs in walkways, pet beds, electrical cords, etc? No  Adequate lighting in your home to reduce risk of falls? Yes   ASSISTIVE DEVICES UTILIZED TO PREVENT FALLS:  Life alert? No  Use of a cane, walker or w/c? No  Grab bars in the bathroom? No  Shower chair or bench in shower? No  Elevated toilet seat or a handicapped toilet? No   TIMED UP AND GO:  Was the test performed?  N/A .  Length of time to ambulate 10 feet: N/A sec.     Cognitive Function:        Immunizations Immunization History  Administered Date(s) Administered   Influenza Inj Mdck Quad Pf 06/10/2017   Influenza,inj,Quad PF,6+ Mos 04/02/2019   Tdap  10/25/2016    TDAP status: Up to date  Flu Vaccine status: Due, Education has been provided regarding the importance of this vaccine. Advised may receive this vaccine at local pharmacy or Health Dept. Aware to provide a copy of the vaccination record if obtained from local pharmacy or Health Dept. Verbalized acceptance and understanding.    Covid-19 vaccine status: Completed vaccines  Qualifies for Shingles Vaccine? No     Screening Tests Health Maintenance  Topic Date Due   COVID-19 Vaccine (1) Never done  PAP-Cervical Cytology Screening  12/12/2019   PAP SMEAR-Modifier  12/12/2019   INFLUENZA VACCINE  02/12/2021   Pneumococcal Vaccine 71-56 Years old (1 - PCV) 03/05/2022 (Originally 11/12/1997)   Hepatitis C Screening  03/05/2022 (Originally 11/12/2009)   TETANUS/TDAP  10/26/2026   HIV Screening  Completed   HPV VACCINES  Aged Out    Health Maintenance  Health Maintenance Due  Topic Date Due   COVID-19 Vaccine (1) Never done   PAP-Cervical Cytology Screening  12/12/2019   PAP SMEAR-Modifier  12/12/2019   INFLUENZA VACCINE  02/12/2021          Lung Cancer Screening: (Low Dose CT Chest recommended if Age 73-80 years, 30 pack-year currently smoking OR have quit w/in 15years.) does not qualify.   Lung Cancer Screening Referral: N/A  Additional Screening:  Hepatitis C Screening: does not qualify; Completed N/A  Vision Screening: Recommended annual ophthalmology exams for early detection of glaucoma and other disorders of the eye. Is the patient up to date with their annual eye exam?   Pt was unsure of last eye exam date  or name of office.  Who is the provider or what is the name of the office in which the patient attends annual eye exams? Unknown If pt is not established with a provider, would they like to be referred to a provider to establish care?  Pt was unsure of name of provider at the time of this visit but I advised her to make an appt with them once she finds  out if it has been more than a year.  .   Dental Screening: Recommended annual dental exams for proper oral hygiene  Community Resource Referral / Chronic Care Management: CRR required this visit?  No   CCM required this visit?  No      Plan:     I have personally reviewed and noted the following in the patient's chart:   Medical and social history Use of alcohol, tobacco or illicit drugs  Current medications and supplements including opioid prescriptions.  Functional ability and status Nutritional status Physical activity Advanced directives List of other physicians Hospitalizations, surgeries, and ER visits in previous 12 months Vitals Screenings to include cognitive, depression, and falls Referrals and appointments  In addition, I have reviewed and discussed with patient certain preventive protocols, quality metrics, and best practice recommendations. A written personalized care plan for preventive services as well as general preventive health recommendations were provided to patient.     [name], LPN   [id number]/[id number]   Nurse Notes: Non-Face to face 45 min visit   Ms. Fahrney , Thank you for taking time to come for your Medicare Wellness Visit. I appreciate your ongoing commitment to your health goals. Please review the following plan we discussed and let me know if I can assist you in the future.   These are the goals we discussed:  Goals   None     This is a list of the screening recommended for you and due dates:  Health Maintenance  Topic Date Due   Pap Smear  12/12/2019   Pap Smear  12/12/2019   COVID-19 Vaccine (4 - Booster for Pfizer series) 12/05/2020   Flu Shot  02/12/2021   Pneumococcal Vaccination (1 - PCV) 03/05/2022*   Hepatitis C Screening: USPSTF Recommendation to screen - Ages 18-79 yo.  03/05/2022*   Tetanus Vaccine  10/26/2026   HIV Screening  Completed   HPV Vaccine  Aged Out  *Topic was postponed.  The date shown is not the original  due date.

## 2021-05-04 ENCOUNTER — Ambulatory Visit: Payer: Self-pay | Admitting: Nurse Practitioner

## 2021-06-27 ENCOUNTER — Ambulatory Visit: Payer: Self-pay | Admitting: Family Medicine

## 2021-06-29 ENCOUNTER — Ambulatory Visit: Payer: Medicare Other | Admitting: Nurse Practitioner

## 2021-06-29 ENCOUNTER — Other Ambulatory Visit: Payer: Self-pay

## 2021-07-06 ENCOUNTER — Ambulatory Visit: Payer: Medicare Other | Admitting: Nurse Practitioner

## 2021-08-01 ENCOUNTER — Ambulatory Visit (INDEPENDENT_AMBULATORY_CARE_PROVIDER_SITE_OTHER): Payer: Medicare Other | Admitting: Nurse Practitioner

## 2021-08-01 ENCOUNTER — Encounter: Payer: Self-pay | Admitting: Nurse Practitioner

## 2021-08-01 ENCOUNTER — Other Ambulatory Visit: Payer: Self-pay

## 2021-08-01 VITALS — BP 114/67 | HR 97 | Temp 98.2°F | Resp 20 | Ht 67.0 in | Wt 184.2 lb

## 2021-08-01 DIAGNOSIS — R109 Unspecified abdominal pain: Secondary | ICD-10-CM

## 2021-08-01 DIAGNOSIS — R110 Nausea: Secondary | ICD-10-CM

## 2021-08-01 DIAGNOSIS — M79671 Pain in right foot: Secondary | ICD-10-CM

## 2021-08-01 LAB — POCT URINALYSIS DIP (CLINITEK)
Bilirubin, UA: NEGATIVE
Blood, UA: NEGATIVE
Glucose, UA: NEGATIVE mg/dL
Ketones, POC UA: NEGATIVE mg/dL
Leukocytes, UA: NEGATIVE
Nitrite, UA: NEGATIVE
Spec Grav, UA: 1.025 (ref 1.010–1.025)
Urobilinogen, UA: 1 E.U./dL
pH, UA: 7 (ref 5.0–8.0)

## 2021-08-01 MED ORDER — NAPROXEN 500 MG PO TBEC: 500.0000 mg | DELAYED_RELEASE_TABLET | Freq: Two times a day (BID) | ORAL | 2 refills | Status: DC

## 2021-08-01 MED ORDER — ONDANSETRON HCL 4 MG PO TABS: 4.0000 mg | ORAL_TABLET | Freq: Three times a day (TID) | ORAL | 2 refills | Status: DC | PRN

## 2021-08-01 MED ORDER — LIDOCAINE 5 % EX PTCH: 1.0000 | MEDICATED_PATCH | CUTANEOUS | 0 refills | Status: DC

## 2021-08-01 NOTE — Progress Notes (Signed)
[facility] [address] [place], [place]  [address] Phone:  [phone]   Fax:  [phone]   Established Patient Office Visit  Subjective:  Patient ID: Lisa ChaJasmine D Shaw, female    DOB: 09/26/1990  Age: 31 y.o. MRN: [id number]  CC:  Chief Complaint  Patient presents with   Follow-up   Back Pain    Swelling for 1 week. Swelling has decreased   Foot Swelling    Rt foot swelling comes and goes    HPI [name] presents for follow up. She  has a past medical history of Acne vulgaris, Depression (04/29/2017), Epilepsy (HCC), Migraine, Seizures (HCC) (07/18/2016), Sleep disturbance (04/29/2017), and Vitamin D deficiency (06/2020).   She is having 7/8 -10 pain in right leg and foot. She has swelling in the foot. She has the foot pain on and off. She denies any injury. She has noticed this for 2 weeks. She denies any and increase or change in activity. She does not do a lot of standing . She has numbness or tingling. She deniees previous back injuries. She was also having back pain. She felt like the back pain has improved with heat and OTC along with the gabapentin from her mother. She has been tired  Past Medical History:  Diagnosis Date   Acne vulgaris    Depression 04/29/2017   Epilepsy (HCC)    Migraine    Seizures (HCC) 07/18/2016   Sleep disturbance 04/29/2017   Vitamin D deficiency 06/2020    History reviewed. No pertinent surgical history.  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   High school education   Right handed    Social Determinants of Health   Financial Resource Strain: Low Risk    Difficulty of Paying Living Expenses: Not very hard  Food Insecurity: No Food Insecurity   Worried About Programme researcher, broadcasting/film/videounning Out of Food in the Last Year: Never true   Ran Out of Food in the Last Year: Never true  Transportation Needs: No Transportation Needs   Lack of Transportation (Medical): No   Lack of Transportation (Non-Medical): No  Physical Activity: Sufficiently Active   Days of Exercise per Week: 5 days   Minutes of Exercise per Session: 30 min  Stress: No Stress Concern Present   Feeling of Stress : Only a little  Social Connections: Moderately Integrated   Frequency of Communication with Friends and Family: More than three times a  week   Frequency of Social Gatherings with Friends and Family: More than three times a week   Attends Religious Services: More than 4 times per year   Active Member of [employer]ubs or Organizations: Yes   Attends Engineer, structuralClub or Organization Meetings: More than 4 times per year   Marital Status: Never married  Catering managerntimate Partner Violence: Not At Risk   Fear of Current or Ex-Partner: No   Emotionally Abused: No   Physically Abused: No   Sexually Abused: No    Outpatient Medications Prior to Visit  Medication Sig Dispense Refill   oxcarbazepine (TRILEPTAL) 600 MG tablet Take 1 tablet every night 30 tablet 11   topiramate (TOPAMAX) 100 MG tablet Take 3 tablets (300 mg total) by mouth at bedtime. 90 tablet 11   ondansetron (ZOFRAN) 4 MG tablet Take 1 tablet (4 mg total) by mouth every 8 (eight) hours as  needed for nausea or vomiting. 20 tablet 2   citalopram (CELEXA) 20 MG tablet Take 1 tablet (20 mg total) by mouth at bedtime. 90 tablet 0   Adapalene-Benzoyl Peroxide (EPIDUO) 0.1-2.5 % gel Apply 1 application topically at bedtime. (Patient not taking: Reported on 08/01/2021) 45 g 2   benzonatate (TESSALON) 100 MG capsule Take 1 capsule (100 mg total) by mouth every 8 (eight) hours. (Patient not taking: Reported on 08/01/2021) 21 capsule 0   cyclobenzaprine (FLEXERIL) 10 MG tablet Take 1 tablet (10 mg total) by mouth 3 (three) times daily as needed for muscle spasms. (Patient not taking: Reported on 08/01/2021) 30 tablet 3   Folic Acid 5 MG CAPS Take 1 capsule (5 mg total) by mouth daily. (Patient not taking: Reported on 08/01/2021) 30 capsule 11   No facility-administered medications prior to visit.    No Known Allergies  ROS Review of Systems    Objective:    Physical Exam Constitutional:      General: She is not in acute distress. HENT:     Head: Normocephalic and atraumatic.  Cardiovascular:     Rate and Rhythm: Normal rate and regular rhythm.     Pulses: Normal pulses.     Heart sounds: Normal heart sounds.  Pulmonary:     Effort: Pulmonary effort is normal.     Breath sounds: Normal breath sounds.  Musculoskeletal:        General: Swelling (mild in feet) present. Normal range of motion.     Cervical back: Normal range of motion.     Lumbar back: Normal. No swelling. Normal range of motion. Negative right straight leg raise test and negative left straight leg raise test. No scoliosis.     Right lower leg: No edema.     Left lower leg: No edema.  Skin:    General: Skin is warm and dry.     Capillary Refill: Capillary refill takes less than 2 seconds.  Neurological:     General: No focal deficit present.     Mental Status: She is alert and oriented to person, place, and time.    BP 114/67 (BP Location: Left Arm, Patient Position: Sitting, Cuff Size: Normal)    Pulse 97     Temp 98.2 F (36.8 C)    Resp 20    Ht 5\' 7"  (1.702 m)    Wt 184 lb 3.2 oz (83.6 kg)    LMP 07/10/2021 (Approximate)    SpO2 100%    BMI 28.85 kg/m  Wt Readings from Last 3 Encounters:  08/01/21 184 lb 3.2 oz (  83.6 kg)  01/11/21 181 lb (82.1 kg)  08/14/20 178 lb (80.7 kg)     Health Maintenance Due  Topic Date Due   PAP-Cervical Cytology Screening  12/12/2019   PAP SMEAR-Modifier  12/12/2019   COVID-19 Vaccine (4 - Booster for Pfizer series) 11/07/2020   INFLUENZA VACCINE  02/12/2021    There are no preventive care reminders to display for this patient.  Lab Results  Component Value Date   TSH 0.989 04/02/2019   Lab Results  Component Value Date   WBC 4.7 06/28/2020   HGB 12.3 06/28/2020   HCT 35.7 06/28/2020   MCV 104 (H) 06/28/2020   PLT 349 06/28/2020   Lab Results  Component Value Date   NA 141 06/28/2020   K 4.2 06/28/2020   CO2 19 (L) 06/28/2020   GLUCOSE 75 06/28/2020   BUN 10 06/28/2020   CREATININE 0.68 06/28/2020   BILITOT <0.2 06/28/2020   ALKPHOS 115 06/28/2020   AST 14 06/28/2020   ALT 12 06/28/2020   PROT 6.5 06/28/2020   ALBUMIN 4.1 06/28/2020   CALCIUM 8.9 06/28/2020   ANIONGAP 9 05/14/2018   Lab Results  Component Value Date   CHOL 190 06/28/2020   Lab Results  Component Value Date   HDL 44 06/28/2020   Lab Results  Component Value Date   LDLCALC 120 (H) 06/28/2020   Lab Results  Component Value Date   TRIG 146 06/28/2020   Lab Results  Component Value Date   CHOLHDL 4.3 06/28/2020   Lab Results  Component Value Date   HGBA1C 4.5 (L) 01/23/2017      Assessment & Plan:   Problem List Items Addressed This Visit       Other   Nausea Persistent    Relevant Medications   ondansetron (ZOFRAN) 4 MG tablet   Other Relevant Orders   Comp. Metabolic Panel (12) (Completed)   Other Visit Diagnoses     Flank pain, acute    -  Primary   Relevant Medications   naproxen (EC NAPROSYN) 500 MG EC tablet   lidocaine (LIDODERM)  5 %   Other Relevant Orders   CBC with Differential/Platelet (Completed)   Right flank pain       Relevant Orders   DG Lumbar Spine Complete   POCT URINALYSIS DIP (CLINITEK) (Completed)   Right foot pain     Education provided   Relevant Medications   naproxen (EC NAPROSYN) 500 MG EC tablet   lidocaine (LIDODERM) 5 %   Other Relevant Orders   DG Foot Complete Right       Meds ordered this encounter  Medications   naproxen (EC NAPROSYN) 500 MG EC tablet    Sig: Take 1 tablet (500 mg total) by mouth 2 (two) times daily with a meal.    Dispense:  60 tablet    Refill:  2    Order Specific Question:   Supervising Provider    Answer:   Quentin Angst [[id number]]   lidocaine (LIDODERM) 5 %    Sig: Place 1 patch onto the skin daily. Remove & Discard patch within 12 hours or as directed by MD    Dispense:  30 patch    Refill:  0    Order Specific Question:   Supervising Provider    Answer:   Quentin Angst [[id number]]   ondansetron (ZOFRAN) 4 MG tablet    Sig: Take 1 tablet (4 mg total) by mouth every 8 (eight) hours as  needed for nausea or vomiting.    Dispense:  20 tablet    Refill:  2    Order Specific Question:   Supervising Provider    Answer:   Quentin Angst [id number]    Follow-up: No follow-ups on file.    Barbette Merino, NP

## 2021-08-01 NOTE — Patient Instructions (Signed)
Edema ?Edema is when you have too much fluid in your body or under your skin. Edema may make your legs, feet, and ankles swell. Swelling often happens in looser tissues, such as around your eyes. This is a common condition. It gets more common as you get older. ?There are many possible causes of edema. These include: ?Eating too much salt (sodium). ?Being on your feet or sitting for a long time. ?Certain medical conditions, such as: ?Pregnancy. ?Heart failure. ?Liver disease. ?Kidney disease. ?Cancer. ?Hot weather may make edema worse. Edema is usually painless. Your skin may look swollen or shiny. ?Follow these instructions at home: ?Medicines ?Take over-the-counter and prescription medicines only as told by your doctor. ?Your doctor may prescribe a medicine to help your body get rid of extra water (diuretic). Take this medicine if you are told to take it. ?Eating and drinking ?Eat a low-salt (low-sodium) diet as told by your doctor. Sometimes, eating less salt may reduce swelling. ?Depending on the cause of your swelling, you may need to limit how much fluid you drink (fluid restriction). ?General instructions ?Raise the injured area above the level of your heart while you are sitting or lying down. ?Do not sit still or stand for a long time. ?Do not wear tight clothes. Do not wear garters on your upper legs. ?Exercise your legs. This can help the swelling go down. ?Wear compression stockings as told by your doctor. It is important that these are the right size. These should be prescribed by your doctor to prevent possible injuries. ?If elastic bandages or wraps are recommended, use them as told by your doctor. ?Contact a doctor if: ?Treatment is not working. ?You have heart, liver, or kidney disease and have symptoms of edema. ?You have sudden and unexplained weight gain. ?Get help right away if: ?You have shortness of breath or chest pain. ?You cannot breathe when you lie down. ?You have pain, redness, or warmth  in the swollen areas. ?You have heart, liver, or kidney disease and get edema all of a sudden. ?You have a fever and your symptoms get worse all of a sudden. ?These symptoms may be an emergency. Get help right away. Call 911. ?Do not wait to see if the symptoms will go away. ?Do not drive yourself to the hospital. ?Summary ?Edema is when you have too much fluid in your body or under your skin. ?Edema may make your legs, feet, and ankles swell. Swelling often happens in looser tissues, such as around your eyes. ?Raise the injured area above the level of your heart while you are sitting or lying down. ?Follow your doctor's instructions about diet and how much fluid you can drink. ?This information is not intended to replace advice given to you by your health care provider. Make sure you discuss any questions you have with your health care provider. ?Document Revised: 03/05/2021 Document Reviewed: 03/05/2021 ?Elsevier Patient Education ? 2022 Elsevier Inc. ? ?

## 2021-08-02 LAB — CBC WITH DIFFERENTIAL/PLATELET
Basophils Absolute: 0.1 10*3/uL (ref 0.0–0.2)
Basos: 1 %
EOS (ABSOLUTE): 0 10*3/uL (ref 0.0–0.4)
Eos: 1 %
Hematocrit: 37.3 % (ref 34.0–46.6)
Hemoglobin: 12.4 g/dL (ref 11.1–15.9)
Immature Grans (Abs): 0.1 10*3/uL (ref 0.0–0.1)
Immature Granulocytes: 1 %
Lymphocytes Absolute: 2.7 10*3/uL (ref 0.7–3.1)
Lymphs: 45 %
MCH: 35.4 pg — ABNORMAL HIGH (ref 26.6–33.0)
MCHC: 33.2 g/dL (ref 31.5–35.7)
MCV: 107 fL — ABNORMAL HIGH (ref 79–97)
Monocytes Absolute: 0.4 10*3/uL (ref 0.1–0.9)
Monocytes: 7 %
Neutrophils Absolute: 2.7 10*3/uL (ref 1.4–7.0)
Neutrophils: 45 %
Platelets: 343 10*3/uL (ref 150–450)
RBC: 3.5 x10E6/uL — ABNORMAL LOW (ref 3.77–5.28)
RDW: 13.6 % (ref 11.7–15.4)
WBC: 6 10*3/uL (ref 3.4–10.8)

## 2021-08-02 LAB — COMP. METABOLIC PANEL (12)
AST: 12 IU/L (ref 0–40)
Albumin/Globulin Ratio: 2 (ref 1.2–2.2)
Albumin: 4.3 g/dL (ref 3.9–5.0)
Alkaline Phosphatase: 123 IU/L — ABNORMAL HIGH (ref 44–121)
BUN/Creatinine Ratio: 17 (ref 9–23)
BUN: 13 mg/dL (ref 6–20)
Bilirubin Total: <0.2 mg/dL (ref 0.0–1.2)
Calcium: 9.1 mg/dL (ref 8.7–10.2)
Chloride: 111 mmol/L — ABNORMAL HIGH (ref 96–106)
Creatinine, Ser: 0.78 mg/dL (ref 0.57–1.00)
Globulin, Total: 2.1 g/dL (ref 1.5–4.5)
Glucose: 86 mg/dL (ref 70–99)
Potassium: 3.9 mmol/L (ref 3.5–5.2)
Sodium: 140 mmol/L (ref 134–144)
Total Protein: 6.4 g/dL (ref 6.0–8.5)
eGFR: 105 mL/min/{1.73_m2} (ref >59)

## 2021-08-08 ENCOUNTER — Ambulatory Visit: Payer: Medicare Other | Admitting: Neurology

## 2021-08-15 ENCOUNTER — Other Ambulatory Visit: Payer: Self-pay | Admitting: Neurology

## 2021-08-22 ENCOUNTER — Other Ambulatory Visit: Payer: Self-pay

## 2021-08-22 ENCOUNTER — Encounter: Payer: Self-pay | Admitting: Neurology

## 2021-08-22 ENCOUNTER — Ambulatory Visit (INDEPENDENT_AMBULATORY_CARE_PROVIDER_SITE_OTHER): Payer: Medicare Other | Admitting: Neurology

## 2021-08-22 VITALS — BP 106/73 | HR 88 | Ht 67.0 in | Wt 185.2 lb

## 2021-08-22 DIAGNOSIS — G40009 Localization-related (focal) (partial) idiopathic epilepsy and epileptic syndromes with seizures of localized onset, not intractable, without status epilepticus: Secondary | ICD-10-CM

## 2021-08-22 DIAGNOSIS — G43009 Migraine without aura, not intractable, without status migrainosus: Secondary | ICD-10-CM | POA: Diagnosis not present

## 2021-08-22 MED ORDER — OXCARBAZEPINE 600 MG PO TABS: ORAL_TABLET | ORAL | 11 refills | Status: DC

## 2021-08-22 MED ORDER — TOPIRAMATE 100 MG PO TABS: ORAL_TABLET | ORAL | 11 refills | Status: DC

## 2021-08-22 NOTE — Progress Notes (Signed)
NEUROLOGY FOLLOW UP OFFICE NOTE  Lisa Shaw [id number] 1991/03/21  HISTORY OF PRESENT ILLNESS: I had the pleasure of seeing Lisa Shaw in follow-up in the neurology clinic on 08/22/2021.  The patient was last seen 7 months ago for bilateral tonic-clonic epilepsy and migraines. Prior EEG reported left frontal sharp waves. MRI brain motion-degraded but no acute changes. Since her last visit, she continues to do well with no seizures since June 2020 on oxcarbazepine 600mg  qhs and Topiramate 300mg  qhs without side effects. She denies any staring/unresponsive episodes, gaps in time, olfactory/gustatory hallucinations, focal numbness/tingling/weakness, myoclonic jerks. Migraines overall controlled, she has them around once a month, there has been a lot of stress recently with her grandfather being sick and passing away last week. Last migraine was a week ago, she takes Tylenol for rescue. She denies any dizziness, vision changes, no falls. Sleep and mood are okay. She lives alone. No pregnancy plans. She does not drive.   History on Initial Assessment 12/02/2017: This is a pleasant 31 year old right-handed woman with a history of seizures since 2013. [facility] from [facility] and [facility] were reviewed and will be summarized here, she is depressed in the office today and is a poor historian. She initially had nocturnal seizures with described diffuse rhythmic jerking with eye rolling backwards. She had an EEG in 06/2012 reporting left frontal sharps. There is also note of episodes in 2012 where she would have staring spells lasting around 2 minutes with associated diaphoresis and post-ictal confusion with violence usually at night or early in the morning. Both seizure types were associated with tongue bite and urinary incontinence. There is report of deja vu, and she reports tingling in both hands during the "stare offs." She lives with her mother but is mostly by herself at home during the day, and  feels she has the "stare off" episodes around twice a week. She was initially started on Keppra which caused mood issues, then switched to Topamax. It appears she initially had mental slowing on Topamax, but has been taking 300mg  qhs since 2015. She had seen Dr. 2013 at [facility] last year and was started on Vimpat which she stopped again due to side effects. She reports the last nocturnal convulsion was in June 2018 when she woke up feeling weird with her tongue sore and with a headache. She was given a headache cocktail but was also noted to be very anxious and depressed. She was admitted for involuntary commitment at [facility] in July 2018 for suicidal ideation. She currently denies any suicidal ideation but continues to report significant depression. She reports body twitches in her arms or legs. She has headaches on a daily basis, taking an average of 2 Tylenol or BC powder daily. She feels it is due to "overthinking or depression." She has sleep difficulties with interrupted 5-6 hours of sleep at night. She feels fatigued and drowsy during the day. She has dizziness when standing. Vision is occasionally blurred when watching TV. She has noticed swallowing difficulties. She notices her right foot gets numb frequently and her balance is off. No falls. 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    Epilepsy Risk Factors:  Her maternal great grandmother and great aunt had seizures. Otherwise she had a normal birth and early development.  There is no history of febrile convulsions, CNS infections such as meningitis/encephalitis, significant traumatic brain injury, neurosurgical procedures.   Prior AEDs:  Keppra, Vimpat Laboratory Data:  EEGs: EEG in 06/2012 reported as abnormal secondary to focal sharp wave activity in the left frontal region MRI: I personally reviewed MRI brain without contrast done 06/2012 which was degraded by motion.  Lateral ventricles are mildly prominent in size with a slightly rounded appearance, no transependymal CSF flow. Hippocampi appear symmetric with no abnormal signal seen.    PAST MEDICAL HISTORY: Past Medical History:  Diagnosis Date   Acne vulgaris    Depression 04/29/2017   Epilepsy (HCC)    Migraine    Seizures (HCC) 07/18/2016   Sleep disturbance 04/29/2017   Vitamin D deficiency 06/2020    MEDICATIONS: Current Outpatient Medications on File Prior to Visit  Medication Sig Dispense Refill   citalopram (CELEXA) 20 MG tablet Take 1 tablet (20 mg total) by mouth at bedtime. 90 tablet 0   lidocaine (LIDODERM) 5 % Place 1 patch onto the skin daily. Remove & Discard patch within 12 hours or as directed by MD 30 patch 0   naproxen (EC NAPROSYN) 500 MG EC tablet Take 1 tablet (500 mg total) by mouth 2 (two) times daily with a meal. 60 tablet 2   ondansetron (ZOFRAN) 4 MG tablet Take 1 tablet (4 mg total) by mouth every 8 (eight) hours as needed for nausea or vomiting. 20 tablet 2   oxcarbazepine (TRILEPTAL) 600 MG tablet Take 1 tablet every night 30 tablet 11   topiramate (TOPAMAX) 100 MG tablet TAKE 3 TABLETS(300 MG) BY MOUTH AT BEDTIME 90 tablet 6   No current facility-administered medications on file prior to visit.    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   High school education   Right handed    Social Determinants of Health   Financial Resource Strain: Low Risk    Difficulty of Paying Living Expenses: Not very hard  Food Insecurity: No Food Insecurity   Worried About Programme researcher, broadcasting/film/video in the Last Year: Never true   Ran Out of Food in the Last Year: Never true  Transportation Needs: No Transportation Needs   Lack of Transportation (Medical): No   Lack of Transportation (Non-Medical): No  Physical Activity: Sufficiently Active   Days of Exercise per Week: 5 days   Minutes of Exercise per Session: 30 min  Stress: No Stress Concern Present   Feeling of Stress : Only a little  Social Connections: Moderately Integrated  Frequency of Communication with Friends and Family: More than three times a week   Frequency of Social Gatherings with Friends and Family: More than three times a week   Attends Religious Services: More than 4 times per year   Active Member of [employer] or Organizations: Yes   Attends Engineer, structural: More than 4 times per year   Marital Status: Never married  Catering manager Violence: Not At Risk   Fear of Current or Ex-Partner: No   Emotionally Abused: No   Physically Abused: No   Sexually Abused: No     PHYSICAL EXAM: Vitals:   08/22/21 1351  BP: 106/73  Pulse: 88  SpO2: 100%   General: No acute distress Head:  Normocephalic/atraumatic Skin/Extremities: No rash, no edema Neurological Exam: alert and awake. No aphasia or dysarthria. Fund of knowledge is appropriate. Attention and concentration are normal.   Cranial nerves: Pupils equal, round. Extraocular movements intact with no nystagmus. Visual fields full.  No facial asymmetry.  Motor: Bulk and tone  normal, muscle strength 5/5 throughout with no pronator drift.   Finger to nose testing intact.  Gait narrow-based and steady, able to tandem walk adequately.  Romberg negative.   IMPRESSION: This is a pleasant 31 yo RH woman with a history of depression, migraines, and seizures. 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 Prior EEG in 2013 reported left frontal sharp waves, MRI degraded by motion but no acute changes seen. She remains seizure-free since June 2020 on oxcarbazepine 600mg  qhs and topiramate 300mg  qhs without side effects. Migraines overall controlled on Topiramate. No pregnancy plans, advised folic acid if sexually active. She does not drive. Follow-up in 1 year, call for any changes.   Thank you for allowing me to participate in her care.  Please do not hesitate to call for any questions or concerns.    , M.D.   CC: , NP

## 2021-08-22 NOTE — Patient Instructions (Signed)
Always good to see you. Continue oxcarbazepine 600mg  every night and Topiramate 100mg : take 3 tablets every night. Follow-up in 1 year, call for any changes   Seizure Precautions: 1. If medication has been prescribed for you to prevent seizures, take it exactly as directed.  Do not stop taking the medicine without talking to your doctor first, even if you have not had a seizure in a long time.   2. Avoid activities in which a seizure would cause danger to yourself or to others.  Don't operate dangerous machinery, swim alone, or climb in high or dangerous places, such as on ladders, roofs, or girders.  Do not drive unless your doctor says you may.  3. If you have any warning that you may have a seizure, lay down in a safe place where you can't hurt yourself.    4.  No driving for 6 months from last seizure, as per [facility].   Please refer to the following link on the Epilepsy Foundation of America's website for more information: http://www.epilepsyfoundation.org/answerplace/Social/driving/drivingu.cfm   5.  Maintain good sleep hygiene. Avoid alcohol.  6.  Notify your neurology if you are planning pregnancy or if you become pregnant.  7.  Contact your doctor if you have any problems that may be related to the medicine you are taking.  8.  Call 911 and bring the patient back to the ED if:        A.  The seizure lasts longer than 5 minutes.       B.  The patient doesn't awaken shortly after the seizure  C.  The patient has new problems such as difficulty seeing, speaking or moving  D.  The patient was injured during the seizure  E.  The patient has a temperature over 102 F (39C)  F.  The patient vomited and now is having trouble breathing

## 2021-09-18 ENCOUNTER — Telehealth: Payer: Self-pay

## 2021-09-18 NOTE — Telephone Encounter (Signed)
New message   [facility] Key: [id number] - PA Case ID: [id number] Need help? Call us at [phone]  Outcome Additional Information Required This medication or product is on your plan's list of covered drugs. Prior authorization is not required at this time. If your pharmacy has questions regarding the processing of your prescription, please have them call the [facility] pharmacy help desk at [phone]. **Please note: This request was submitted electronically. Formulary lowering, tiering exception, cost reduction and/or pre-benefit determination review (including prospective Medicare hospice reviews) requests cannot be requested using this method of submission. Providers contact us at [phone] for further assistance.  Drug Topiramate 100MG  tablets Form [facility] Medicare Part D Electronic Prior Authorization Form (2017 NCPDP)

## 2021-12-28 ENCOUNTER — Ambulatory Visit (INDEPENDENT_AMBULATORY_CARE_PROVIDER_SITE_OTHER): Payer: Medicare Other | Admitting: Nurse Practitioner

## 2021-12-28 ENCOUNTER — Encounter: Payer: Self-pay | Admitting: Nurse Practitioner

## 2021-12-28 VITALS — BP 105/63 | HR 92 | Temp 97.3°F | Ht 67.0 in | Wt 181.6 lb

## 2021-12-28 DIAGNOSIS — G8929 Other chronic pain: Secondary | ICD-10-CM

## 2021-12-28 DIAGNOSIS — M545 Low back pain, unspecified: Secondary | ICD-10-CM

## 2021-12-28 MED ORDER — PREDNISONE 20 MG PO TABS: 20.0000 mg | ORAL_TABLET | Freq: Every day | ORAL | 0 refills | Status: AC

## 2021-12-28 MED ORDER — KETOROLAC TROMETHAMINE 30 MG/ML IJ SOLN
15.0000 mg | Freq: Once | INTRAMUSCULAR | Status: AC
Start: 2021-12-28 — End: 2021-12-28
  Administered 2021-12-28: 15 mg via INTRAMUSCULAR

## 2021-12-28 NOTE — Patient Instructions (Signed)
You were seen today in the [facility] for low back pain. You were prescribed medications, please take as directed. Please follow up as needed  

## 2021-12-28 NOTE — Progress Notes (Signed)
[place],   [address] Phone:  [phone]   Fax:  [phone] Subjective:   Patient ID: Lisa Shaw, female    DOB: Mar 22, 1991, 31 y.o.   MRN: [id number]  Chief Complaint  Patient presents with   Back Pain    Pt is here for lower back pain pt stated she had this pain for 6 months. Patient stated she has been taking OTC medication but its not helping the pain.   HPI Lisa Shaw 31 y.o. female  has a past medical history of Acne vulgaris, Depression (04/29/2017), Epilepsy ([place]), Migraine, Seizures ([place]) (07/18/2016), Sleep disturbance (04/29/2017), and Vitamin D deficiency (06/2020). To the [facility] for low back pain.   Patient states that she has had low back pain for 6 mths. Denies any fall or trauma. Pain predominantly located in the right lower back. Currently unemployed, does chores at home during the day. Has been taking OTC medications with moderate improvement in pain. Rates pain 4-5/10 and describes as throbbing. Denies any numbness and tingling. Denies any other concerns today.   Denies any fatigue, chest pain, shortness of breath, HA or dizziness. Denies any blurred vision, numbness or tingling.   Past Medical History:  Diagnosis Date   Acne vulgaris    Depression 04/29/2017   Epilepsy ([place])    Migraine    Seizures ([place]) 07/18/2016   Sleep disturbance 04/29/2017   Vitamin D deficiency 06/2020    No past surgical history on file.  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   High school education   Right handed    Social Determinants of Health   Financial Resource Strain: Low Risk  (03/28/2021)   Overall Financial Resource Strain (CARDIA)    Difficulty of Paying Living Expenses: Not very hard  Food Insecurity: No Food Insecurity (03/28/2021)   Hunger Vital Sign    Worried About Running Out of Food in the Last Year: Never true    Ran Out of Food in the Last Year: Never true  Transportation Needs: No Transportation Needs (03/28/2021)   PRAPARE - Hydrologist (Medical): No    Lack of Transportation (Non-Medical): No  Physical Activity: Sufficiently Active (03/28/2021)   Exercise Vital Sign    Days of Exercise per Week: 5 days    Minutes of Exercise per Session: 30 min  Stress: No Stress Concern Present (03/28/2021)  [place] Questionnaire    Feeling of Stress : Only a little  Social Connections: Moderately Integrated (03/28/2021)   Social Connection and Isolation Panel [NHANES]    Frequency of Communication with Friends and Family: More than three times a week    Frequency of Social Gatherings with Friends and Family: More than three times a week    Attends Religious Services: More than 4 times per year    Active Member of [employer] or Organizations: Yes    Attends Archivist Meetings: More than 4 times per year    Marital Status: Never married  Intimate Partner Violence: Not At Risk (03/28/2021)   Humiliation, Afraid, Rape, and Kick questionnaire    Fear of Current or Ex-Partner: No     Emotionally Abused: No    Physically Abused: No    Sexually Abused: No    Outpatient Medications Prior to Visit  Medication Sig Dispense Refill   ondansetron (ZOFRAN) 4 MG tablet Take 1 tablet (4 mg total) by mouth every 8 (eight) hours as needed for nausea or vomiting. 20 tablet 2   oxcarbazepine (TRILEPTAL) 600 MG tablet Take 1 tablet every night 30 tablet 11   topiramate (TOPAMAX) 100 MG tablet TAKE 3 TABLETS(300 MG) BY MOUTH AT BEDTIME 90 tablet 11   citalopram (CELEXA) 20 MG tablet Take 1 tablet (20 mg total) by mouth at bedtime. 90 tablet 0   No facility-administered medications prior to visit.    No Known Allergies  Review of Systems  Constitutional: Negative.  Negative for chills, fever and malaise/fatigue.  Respiratory:  Negative for cough and shortness of breath.   Cardiovascular:  Negative for chest pain, palpitations and leg swelling.  Gastrointestinal:  Negative for abdominal pain, blood in stool, constipation, diarrhea, nausea and vomiting.  Genitourinary: Negative.   Musculoskeletal:  Positive for back pain.  Skin: Negative.   Neurological: Negative.   Psychiatric/Behavioral:  Negative for depression. The patient is not nervous/anxious.   All other systems reviewed and are negative.      Objective:    Physical Exam Vitals reviewed.  Constitutional:      General: She is not in acute distress.    Appearance: Normal appearance. She is normal weight.  HENT:     Head: Normocephalic.  Neck:     Vascular: No carotid bruit.  Cardiovascular:     Rate and Rhythm: Normal rate and regular rhythm.     Pulses: Normal pulses.     Heart sounds: Normal heart sounds.     Comments: No obvious peripheral edema Pulmonary:     Effort: Pulmonary effort is normal.     Breath sounds: Normal breath sounds.  Musculoskeletal:        General: No swelling, tenderness, deformity or signs of injury. Normal range of motion.     Cervical back: Normal range of motion and neck supple.  No rigidity or tenderness.     Right lower leg: No edema.     Left lower leg: No edema.  Lymphadenopathy:     Cervical: No cervical adenopathy.  Skin:    General: Skin is warm and dry.     Capillary Refill: Capillary refill takes less than 2 seconds.  Neurological:     General: No focal deficit present.     Mental Status: She is alert and oriented to person, place, and time.  Psychiatric:        Mood and Affect: Mood normal.  Behavior: Behavior normal.        Thought Content: Thought content normal.        Judgment: Judgment normal.     BP 105/63 (BP Location: Right Arm, Patient Position: Sitting, Cuff Size: Normal)   Pulse 92   Temp (!) 97.3 F (36.3 C)   Ht _0  (1.702 m)   Wt 181 lb 9.6 oz (82.4 kg)   LMP 12/14/2021 (Approximate)   SpO2 100%   BMI 28.44 kg/m  Wt Readings from Last 3 Encounters:  12/28/21 181 lb 9.6 oz (82.4 kg)  08/22/21 185 lb 3.2 oz (84 kg)  08/01/21 184 lb 3.2 oz (83.6 kg)    Immunization History  Administered Date(s) Administered   Influenza Inj Mdck Quad Pf 06/10/2017   Influenza,inj,Quad PF,6+ Mos 04/02/2019   PFIZER(Purple Top)SARS-COV-2 Vaccination 10/10/2019, 05/22/2020, 09/12/2020   Tdap 10/25/2016    Diabetic Foot Exam - Simple   No data filed     Lab Results  Component Value Date   TSH 0.989 04/02/2019   Lab Results  Component Value Date   WBC 5.3 01/03/2022   HGB 13.0 01/03/2022   HCT 38.3 01/03/2022   MCV 108.8 (H) 01/03/2022   PLT 365 01/03/2022   Lab Results  Component Value Date   NA 140 08/01/2021   K 3.9 08/01/2021   CO2 19 (L) 06/28/2020   GLUCOSE 86 08/01/2021   BUN 13 08/01/2021   CREATININE 0.78 08/01/2021   BILITOT <0.2 08/01/2021   ALKPHOS 123 (H) 08/01/2021   AST 12 08/01/2021   ALT 12 06/28/2020   PROT 6.4 08/01/2021   ALBUMIN 4.3 08/01/2021   CALCIUM 9.1 08/01/2021   ANIONGAP 9 05/14/2018   EGFR 105 08/01/2021   Lab Results  Component Value Date   CHOL 190 06/28/2020   Lab Results   Component Value Date   HDL 44 06/28/2020   Lab Results  Component Value Date   LDLCALC 120 (H) 06/28/2020   Lab Results  Component Value Date   TRIG 146 06/28/2020   Lab Results  Component Value Date   CHOLHDL 4.3 06/28/2020   Lab Results  Component Value Date   HGBA1C 4.5 (L) 01/23/2017       Assessment & Plan:   Problem List Items Addressed This Visit   None Visit Diagnoses     Chronic low back pain, unspecified back pain laterality, unspecified whether sciatica present    -  Primary   Relevant Medications   ketorolac (TORADOL) 30 MG/ML injection 15 mg (Completed)   Other Relevant Orders   AMB referral to orthopedics Discussed non pharmacological methods for management of symptoms Informed to take OTC medications as needed    Maintain upcoming follow up with PCP, sooner as needed if symptoms worsen or do not improve    I am having Nakiya D. Goehring start on predniSONE. I am also having her maintain her citalopram, ondansetron, topiramate, and oxcarbazepine. We administered ketorolac.  Meds ordered this encounter  Medications   ketorolac (TORADOL) 30 MG/ML injection 15 mg   predniSONE (DELTASONE) 20 MG tablet    Sig: Take 1 tablet (20 mg total) by mouth daily with breakfast for 5 days.    Dispense:  5 tablet    Refill:  0     Teena Dunk, NP

## 2022-01-01 ENCOUNTER — Ambulatory Visit: Payer: Self-pay | Admitting: Nurse Practitioner

## 2022-01-03 ENCOUNTER — Ambulatory Visit (INDEPENDENT_AMBULATORY_CARE_PROVIDER_SITE_OTHER): Payer: Medicare Other | Admitting: Surgery

## 2022-01-03 ENCOUNTER — Ambulatory Visit: Payer: Self-pay

## 2022-01-03 ENCOUNTER — Encounter: Payer: Self-pay | Admitting: Surgery

## 2022-01-03 DIAGNOSIS — M533 Sacrococcygeal disorders, not elsewhere classified: Secondary | ICD-10-CM

## 2022-01-03 DIAGNOSIS — M545 Low back pain, unspecified: Secondary | ICD-10-CM

## 2022-01-03 DIAGNOSIS — G8929 Other chronic pain: Secondary | ICD-10-CM

## 2022-01-07 LAB — RHEUMATOID FACTOR: Rheumatoid fact SerPl-aCnc: <14 IU/mL (ref <14)

## 2022-01-07 LAB — CBC
HCT: 38.3 % (ref 35.0–45.0)
Hemoglobin: 13 g/dL (ref 11.7–15.5)
MCH: 36.9 pg — ABNORMAL HIGH (ref 27.0–33.0)
MCHC: 33.9 g/dL (ref 32.0–36.0)
MCV: 108.8 fL — ABNORMAL HIGH (ref 80.0–100.0)
MPV: 8.5 fL (ref 7.5–12.5)
Platelets: 365 10*3/uL (ref 140–400)
RBC: 3.52 10*6/uL — ABNORMAL LOW (ref 3.80–5.10)
RDW: 12.7 % (ref 11.0–15.0)
WBC: 5.3 10*3/uL (ref 3.8–10.8)

## 2022-01-07 LAB — ANTI-NUCLEAR AB-TITER (ANA TITER): ANA Titer 1: 1:80 {titer} — ABNORMAL HIGH

## 2022-01-07 LAB — ANA: Anti Nuclear Antibody (ANA): POSITIVE — AB

## 2022-01-07 LAB — URIC ACID: Uric Acid, Serum: 3.2 mg/dL (ref 2.5–7.0)

## 2022-01-07 LAB — SEDIMENTATION RATE: Sed Rate: 9 mm/h (ref 0–20)

## 2022-01-23 ENCOUNTER — Telehealth: Payer: Self-pay

## 2022-01-23 NOTE — Telephone Encounter (Signed)
Talked with patient and scheduled her a 2 week ROV per Zonia Kief.

## 2022-02-06 ENCOUNTER — Encounter: Payer: Self-pay | Admitting: Surgery

## 2022-02-06 ENCOUNTER — Ambulatory Visit (INDEPENDENT_AMBULATORY_CARE_PROVIDER_SITE_OTHER): Payer: Medicare Other | Admitting: Surgery

## 2022-02-06 VITALS — BP 118/73 | HR 99

## 2022-02-06 DIAGNOSIS — R768 Other specified abnormal immunological findings in serum: Secondary | ICD-10-CM | POA: Diagnosis not present

## 2022-02-06 NOTE — Progress Notes (Signed)
31 year old  Office Visit Note   Patient: Lisa Shaw           Date of Birth: 1990-08-31           MRN: [id number] Visit Date: 02/06/2022              Requested by: Barbette Merino, NP [address] [place],  [place] [address] PCP: Barbette Merino, NP   Assessment & Plan: Visit Diagnoses:  1. ANA positive     Plan: With patient's abnormal ANA and SI joint pain and I will refer her to rheumatologist Dr. Zenovia Jordan.  I stressed to patient the importance of keeping the appointment with her once it is made.  She will follow-up me in 2 months for recheck.  I did offer scheduling of right SI joint injection today to see if this helps her pain but she declined.  Follow-Up Instructions: Return in about 2 months (around 04/09/2022) for With [facility] recheck SI joint pain.   Orders:  Orders Placed This Encounter  Procedures   Ambulatory referral to Rheumatology   No orders of the defined types were placed in this encounter.     Procedures: No procedures performed   Clinical Data: No additional findings.   Subjective: No chief complaint on file.   HPI 31 year old black female returns for review of her labs that were drawn last office visit.  Labs were positive for:  Component Ref Range & Units 1 mo ago  ANA Titer 1 titer 1:80 High    Comment: A low level ANA titer may be present in pre-clinical  autoimmune diseases and normal individuals.                  Reference Range                  <1:40        Negative                  1:40-1:80    Low Antibody Level                  >1:80        Elevated Antibody Level  .   ANA Pattern 1  Nuclear, Speckled Abnormal    Comment: Speckled pattern is associated with mixed connective  tissue disease (MCTD), systemic lupus erythematosus  (SLE), Sjogren's syndrome, dermatomyositis, and  systemic sclerosis/polymyositis overlap.  .  AC-2,4,5,29Lucille Shaw     She continues to have ongoing right great and left SI joint pain.  No  lower extremity radicular symptoms.    Objective: Vital Signs: BP 118/73 (BP Location: Left Arm, Patient Position: Sitting)   Pulse 99   Physical Exam Pleasant female alert and oriented in no acute distress.  Gait is normal.  She has moderate to marked tenderness over the right SI joint.  Less tender over the left side.  Negative log about hips.  Negative straight leg raise.  No focal motor deficits. Ortho Exam  Specialty Comments:  No specialty comments available.  Imaging: No results found.   PMFS History: Patient Active Problem List   Diagnosis Date Noted   Acne vulgaris 06/23/2019   Muscle strain 04/02/2019   Nausea 04/02/2019   History of anemia 04/02/2019   GAD (generalized anxiety disorder) 09/05/2018   Cannabis use disorder, mild, in sustained remission 09/05/2018   Major depressive disorder, recurrent episode, in full remission (HCC) 02/12/2018   Sleep disturbance 04/29/2017   History  of suicide attempt 02/11/2017   Abdominal pain 10/29/2016   Seizures (HCC) 10/25/2016   Chronic headaches 05/18/2013   Past Medical History:  Diagnosis Date   Acne vulgaris    Depression 04/29/2017   Epilepsy (HCC)    Migraine    Seizures (HCC) 07/18/2016   Sleep disturbance 04/29/2017   Vitamin D deficiency 06/2020    Family History  Problem Relation Age of Onset   Seizures Other     History reviewed. No pertinent surgical history. 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

## 2022-03-03 ENCOUNTER — Other Ambulatory Visit: Payer: Self-pay | Admitting: Neurology

## 2022-03-06 ENCOUNTER — Other Ambulatory Visit: Payer: Self-pay | Admitting: Neurology

## 2022-04-23 ENCOUNTER — Ambulatory Visit: Payer: Medicare Other | Admitting: Neurology

## 2022-07-18 ENCOUNTER — Ambulatory Visit (INDEPENDENT_AMBULATORY_CARE_PROVIDER_SITE_OTHER): Payer: Medicare Other

## 2022-07-18 VITALS — Wt 181.0 lb

## 2022-07-18 DIAGNOSIS — Z0000 Encounter for general adult medical examination without abnormal findings: Secondary | ICD-10-CM

## 2022-07-18 NOTE — Progress Notes (Cosign Needed Addendum)
Subjective:   Lisa Shaw is a 32 y.o. female who presents for Medicare Annual (Subsequent) preventive examination.  I connected with  Lisa Shaw on 07/26/22 by a audio enabled telemedicine application and verified that I am speaking with the correct person using two identifiers.  Patient Location: Home  Provider Location: Home Office  I discussed the limitations of evaluation and management by telemedicine. The patient expressed understanding and agreed to proceed.   Review of Systems     Cardiac Risk Factors include: smoking/ tobacco exposure     Objective:    Today's Vitals   07/18/22 1442  Weight: 181 lb (82.1 kg)  PainSc: 5    Body mass index is 28.35 kg/m.     07/18/2022    2:47 PM 08/22/2021    1:55 PM 03/28/2021    5:14 PM 01/11/2021   10:07 AM 08/14/2020   11:54 AM 10/11/2019    1:47 PM 01/07/2019    9:11 AM  Advanced Directives  Does Patient Have a Medical Advance Directive? No No No No No No No  Would patient like information on creating a medical advance directive? No - Patient declined  Yes (ED - Information included in AVS)        Current Medications (verified) Outpatient Encounter Medications as of 07/18/2022  Medication Sig   ondansetron (ZOFRAN) 4 MG tablet Take 1 tablet (4 mg total) by mouth every 8 (eight) hours as needed for nausea or vomiting.   oxcarbazepine (TRILEPTAL) 600 MG tablet Take 1 tablet every night   topiramate (TOPAMAX) 100 MG tablet TAKE 3 TABLETS(300 MG) BY MOUTH AT BEDTIME   citalopram (CELEXA) 20 MG tablet Take 1 tablet (20 mg total) by mouth at bedtime.   No facility-administered encounter medications on file as of 07/18/2022.    Allergies (verified) Patient has no known allergies.   History: Past Medical History:  Diagnosis Date   Acne vulgaris    Depression 04/29/2017   Epilepsy ([place])    Migraine    Seizures ([place]) 07/18/2016   Sleep disturbance 04/29/2017   Vitamin D deficiency 06/2020   History  reviewed. No pertinent surgical history. 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   High school education   Right handed    Social Determinants of Health   Financial Resource Strain: Low Risk  (07/18/2022)  Overall Financial Resource Strain (CARDIA)    Difficulty of Paying Living Expenses: Not hard at all  Food Insecurity: No Food Insecurity (07/18/2022)   Hunger Vital Sign    Worried About Running Out of Food in the Last Year: Never true    Ran Out of Food in the Last Year: Never true  Transportation Needs: No Transportation Needs (07/18/2022)   PRAPARE - Hydrologist (Medical): No    Lack of Transportation (Non-Medical): No  Physical Activity: Sufficiently Active (07/18/2022)   Exercise Vital Sign    Days of Exercise per Week: 6 days    Minutes of Exercise per Session: 40 min  Stress: No Stress Concern Present (07/18/2022)   [place]    Feeling of Stress : Not at all  Social Connections: [place]  (07/18/2022)   Social Connection and Isolation Panel [NHANES]    Frequency of Communication with Friends and Family: More than three times a week    Frequency of Social Gatherings with Friends and Family: More than three times a week    Attends Religious Services: More than 4 times per year    Active Member of [employer] or Organizations: Yes    Attends Music therapist: More than 4 times per year    Marital Status: Married    Tobacco Counseling Ready to quit: Not Answered Counseling given: Not Answered Tobacco comments: 6-7 cigarettes per day   Clinical Intake:  Pre-visit preparation completed: Yes  Pain : 0-10 Pain Score: 5  Pain Type: Chronic pain Pain Location: Back Pain Orientation: Lower Pain Descriptors / Indicators: Aching Pain Onset: More than a month ago Pain Frequency: Intermittent Pain Relieving Factors: rest - tylenol and ibuprofen don't seem to help  Pain Relieving Factors: rest - tylenol and ibuprofen don't seem to help  BMI - recorded: 28.35 Nutritional Status: BMI 25 -29 Overweight Nutritional Risks: None Diabetes: No  How often do you need to have someone help you when you read instructions, pamphlets, or other written materials from your doctor or pharmacy?: 1 - Never  Diabetic?no  Interpreter Needed?: No  Information entered by :: Advit Trethewey, LPN   Activities of Daily Living    07/18/2022    2:48 PM 07/18/2022   10:05 AM  In your present state of health, do you have any difficulty performing the following activities:  Hearing? 0 0  Vision? 0 0  Difficulty concentrating or making decisions? 0 0  Walking or climbing stairs? 0 0  Dressing or bathing? 0 0  Doing errands, shopping? 0 0  Preparing Food and eating ? N N  Using the Toilet? N N  In the past six months, have you accidently leaked urine? N N  Do you have problems with loss of bowel control? N N  Managing your Medications? N N  Managing your Finances? N N  Housekeeping or  managing your Housekeeping? N N    Patient Care Team: Vevelyn Francois, NP as PCP - General (Adult Health Nurse Practitioner) Cameron Sprang, MD as Consulting Physician (Neurology) Clark-Burning, Anderson Malta, PA-C (Inactive) (Dermatology)  Indicate any recent Medical Services you may have received from other than [facility] providers in the past year (date may be approximate).     Assessment:   This is a routine wellness examination for [place].  Hearing/Vision screen Hearing Screening - Comments:: Denies hearing difficulties   Vision Screening - Comments:: No vision concerns - does  not have optometrist at this time   Dietary issues and exercise activities discussed: Current Exercise Habits: Home exercise routine, Type of exercise: walking;stretching, Time (Minutes): 50, Frequency (Times/Week): 5, Weekly Exercise (Minutes/Week): 250, Intensity: Mild, Exercise limited by: None identified   Goals Addressed             This Visit's Progress    Patient Stated       Eat healthier and drink more water       Depression Screen    07/18/2022    2:46 PM 12/28/2021    9:48 AM 03/26/2021   12:55 PM 03/05/2021    4:00 PM 12/28/2019    9:35 AM 06/22/2019   11:30 AM 04/02/2019    1:24 PM  PHQ 2/9 Scores  PHQ - 2 Score 0 0 0 0 1 0 0  PHQ- 9 Score  0 0 0 1      Fall Risk    07/18/2022    2:44 PM 07/18/2022   10:05 AM 12/28/2021    9:46 AM 08/22/2021    1:55 PM 03/28/2021    5:15 PM  Fall Risk   Falls in the past year? 0 0 0 0 0  Number falls in past yr: 0 0 0 0   Injury with Fall? 0 0 0 0   Risk for fall due to : No Fall Risks  No Fall Risks    Follow up Falls prevention discussed  Falls evaluation completed  Falls evaluation completed    FALL RISK PREVENTION PERTAINING TO THE HOME:  Any stairs in or around the home? Yes  If so, are there any without handrails? No  Home free of loose throw rugs in walkways, pet beds, electrical cords, etc? Yes  Adequate lighting in your home to reduce risk  of falls? Yes   ASSISTIVE DEVICES UTILIZED TO PREVENT FALLS:  Life alert? No  Use of a cane, walker or w/c? No  Grab bars in the bathroom? No  Shower chair or bench in shower? No  Elevated toilet seat or a handicapped toilet? No   TIMED UP AND GO:  Was the test performed? No . televisit  Cognitive Function:        07/18/2022    2:49 PM 03/28/2021    5:17 PM  6CIT Screen  What Year? 0 points 0 points  What month? 0 points 0 points  What time? 0 points 0 points  Count back from 20 0 points 0 points  Months in reverse 4 points 0 points  Repeat phrase 4 points 0 points  Total Score 8 points 0 points    Immunizations Immunization History  Administered Date(s) Administered   Influenza Inj Mdck Quad Pf 06/10/2017   Influenza,inj,Quad PF,6+ Mos 04/02/2019   PFIZER(Purple Top)SARS-COV-2 Vaccination 10/10/2019, 05/22/2020, 09/12/2020   Tdap 10/25/2016    TDAP status: Up to date  Flu Vaccine status: Due, Education has been provided regarding the importance of this vaccine. Advised may receive this vaccine at local pharmacy or Health Dept. Aware to provide a copy of the vaccination record if obtained from local pharmacy or Health Dept. Verbalized acceptance and understanding.  PNA - not recommended yet  Covid-19 vaccine status: Completed vaccines  Qualifies for Shingles Vaccine? No     Screening Tests Health Maintenance  Topic Date Due   Hepatitis C Screening  Never done   PAP SMEAR-Modifier  12/11/2021   INFLUENZA VACCINE  02/12/2022   COVID-19 Vaccine (4 - 2023-24 season) 03/15/2022  Medicare Annual Wellness (AWV)  07/19/2023   DTaP/Tdap/Td (2 - Td or Tdap) 10/26/2026   HIV Screening  Completed   HPV VACCINES  Aged Out    Health Maintenance  Health Maintenance Due  Topic Date Due   Hepatitis C Screening  Never done   PAP SMEAR-Modifier  12/11/2021   INFLUENZA VACCINE  02/12/2022   COVID-19 Vaccine (4 - 2023-24 season) 03/15/2022    Not yet age appropriate  for mammo, Dexa, or colonoscopy   Lung Cancer Screening: (Low Dose CT Chest recommended if Age 42-80 years, 30 pack-year currently smoking OR have quit w/in 15years.) does not qualify  Additional Screening:  Hepatitis C Screening: does qualify; She is fine with having labs drawn at next visit  Vision Screening: Recommended annual ophthalmology exams for early detection of glaucoma and other disorders of the eye. Is the patient up to date with their annual eye exam?  No  Who is the provider or what is the name of the office in which the patient attends annual eye exams? none If pt is not established with a provider, would they like to be referred to a provider to establish care? No .   Dental Screening: Recommended annual dental exams for proper oral hygiene  Community Resource Referral / Chronic Care Management: CRR required this visit?  No   CCM required this visit?  No      Plan:     I have personally reviewed and noted the following in the patient's chart:   Medical and social history Use of alcohol, tobacco or illicit drugs  Current medications and supplements including opioid prescriptions. Patient is not currently taking opioid prescriptions. Functional ability and status Nutritional status Physical activity Advanced directives List of other physicians Hospitalizations, surgeries, and ER visits in previous 12 months Vitals Screenings to include cognitive, depression, and falls Referrals and appointments  In addition, I have reviewed and discussed with patient certain preventive protocols, quality metrics, and best practice recommendations. A written personalized care plan for preventive services as well as general preventive health recommendations were provided to patient.     [place] Constable, LPN   09/14/8248   Nurse Notes: She wants to know if she can get a prescription for a muscle relaxer for her back. It hurts on and off since 03/2022 - she is using otc pain  patches, tylenol and ibuprofen with no relief - I advised trying heat or ice to see if either helps and keep appt for next Friday.

## 2022-07-18 NOTE — Patient Instructions (Signed)
Lisa Shaw , Thank you for taking time to come for your Medicare Wellness Visit. I appreciate your ongoing commitment to your health goals. Please review the following plan we discussed and let me know if I can assist you in the future.   These are the goals we discussed:  Goals      Patient Stated     Eat healthier and drink more water        This is a list of the screening recommended for you and due dates:  Health Maintenance  Topic Date Due   Hepatitis C Screening: USPSTF Recommendation to screen - Ages 64-79 yo.  Never done   Pap Smear  12/11/2021   Flu Shot  02/12/2022   COVID-19 Vaccine (4 - 2023-24 season) 03/15/2022   Medicare Annual Wellness Visit  07/19/2023   DTaP/Tdap/Td vaccine (2 - Td or Tdap) 10/26/2026   HIV Screening  Completed   HPV Vaccine  Aged Out    Advanced directives: Advance directive discussed with you today. Even though you declined this today, please call our office should you change your mind, and we can give you the proper paperwork for you to fill out.   Conditions/risks identified: Aim for 30 minutes of exercise or brisk walking, 6-8 glasses of water, and 5 servings of fruits and vegetables each day.   Next appointment: Follow up in one year for your annual wellness visit.   Preventive Care 91-29 Years Old, Female Preventive care refers to lifestyle choices and visits with your health care provider that can promote health and wellness. Preventive care visits are also called wellness exams. What can I expect for my preventive care visit? Counseling During your preventive care visit, your health care provider may ask about your: Medical history, including: Past medical problems. Family medical history. Pregnancy history. Current health, including: Menstrual cycle. Method of birth control. Emotional well-being. Home life and relationship well-being. Sexual activity and sexual health. Lifestyle, including: Alcohol, nicotine or tobacco,  and drug use. Access to firearms. Diet, exercise, and sleep habits. Work and work Statistician. Sunscreen use. Safety issues such as seatbelt and bike helmet use. Physical exam Your health care provider may check your: Height and weight. These may be used to calculate your BMI (body mass index). BMI is a measurement that tells if you are at a healthy weight. Waist circumference. This measures the distance around your waistline. This measurement also tells if you are at a healthy weight and may help predict your risk of certain diseases, such as type 2 diabetes and high blood pressure. Heart rate and blood pressure. Body temperature. Skin for abnormal spots. What immunizations do I need? Vaccines are usually given at various ages, according to a schedule. Your health care provider will recommend vaccines for you based on your age, medical history, and lifestyle or other factors, such as travel or where you work. What tests do I need? Screening Your health care provider may recommend screening tests for certain conditions. This may include: Pelvic exam and Pap test. Lipid and cholesterol levels. Diabetes screening. This is done by checking your blood sugar (glucose) after you have not eaten for a while (fasting). Hepatitis B test. Hepatitis C test. HIV (human immunodeficiency virus) test. STI (sexually transmitted infection) testing, if you are at risk. BRCA-related cancer screening. This may be done if you have a family history of breast, ovarian, tubal, or peritoneal cancers. Talk with your health care provider about your test results, treatment options, and if necessary,  the need for more tests. Follow these instructions at home: Eating and drinking  Eat a healthy diet that includes fresh fruits and vegetables, whole grains, lean protein, and low-fat dairy products. Take vitamin and mineral supplements as recommended by your health care provider. Do not drink alcohol if: Your health  care provider tells you not to drink. You are pregnant, may be pregnant, or are planning to become pregnant. If you drink alcohol: Limit how much you have to 0-1 drink a day. Know how much alcohol is in your drink. In the [place] one drink equals one 12 oz bottle of beer (355 mL), one 5 oz glass of wine (148 mL), or one 1 oz glass of hard liquor (44 mL). Lifestyle Brush your teeth every morning and night with fluoride toothpaste. Floss one time each day. Exercise for at least 30 minutes 5 or more days each week. Do not use any products that contain nicotine or tobacco. These products include cigarettes, chewing tobacco, and vaping devices, such as e-cigarettes. If you need help quitting, ask your health care provider. Do not use drugs. If you are sexually active, practice safe sex. Use a condom or other form of protection to prevent STIs. If you do not wish to become pregnant, use a form of birth control. If you plan to become pregnant, see your health care provider for a prepregnancy visit. Find healthy ways to manage stress, such as: Meditation, yoga, or listening to music. Journaling. Talking to a trusted person. Spending time with friends and family. Minimize exposure to UV radiation to reduce your risk of skin cancer. Safety Always wear your seat belt while driving or riding in a vehicle. Do not drive: If you have been drinking alcohol. Do not ride with someone who has been drinking. If you have been using any mind-altering substances or drugs. While texting. When you are tired or distracted. Wear a helmet and other protective equipment during sports activities. If you have firearms in your house, make sure you follow all gun safety procedures. Seek help if you have been physically or sexually abused. What's next? Go to your health care provider once a year for an annual wellness visit. Ask your health care provider how often you should have your eyes and teeth checked. Stay up to  date on all vaccines. This information is not intended to replace advice given to you by your health care provider. Make sure you discuss any questions you have with your health care provider. Document Revised: 12/27/2020 Document Reviewed: 12/27/2020 Elsevier Patient Education  [place].

## 2022-07-26 ENCOUNTER — Encounter: Payer: Self-pay | Admitting: Nurse Practitioner

## 2022-07-26 ENCOUNTER — Other Ambulatory Visit (HOSPITAL_COMMUNITY)
Admission: RE | Admit: 2022-07-26 | Discharge: 2022-07-26 | Disposition: A | Discharge status: Home / Self Care | Payer: 59 | Source: Ambulatory Visit | Attending: Nurse Practitioner | Admitting: Nurse Practitioner

## 2022-07-26 ENCOUNTER — Ambulatory Visit (INDEPENDENT_AMBULATORY_CARE_PROVIDER_SITE_OTHER): Payer: 59 | Admitting: Nurse Practitioner

## 2022-07-26 VITALS — BP 104/61 | HR 84 | Temp 98.1°F | Ht 63.0 in | Wt 187.6 lb

## 2022-07-26 DIAGNOSIS — Z13228 Encounter for screening for other metabolic disorders: Secondary | ICD-10-CM

## 2022-07-26 DIAGNOSIS — Z1321 Encounter for screening for nutritional disorder: Secondary | ICD-10-CM

## 2022-07-26 DIAGNOSIS — Z1329 Encounter for screening for other suspected endocrine disorder: Secondary | ICD-10-CM

## 2022-07-26 DIAGNOSIS — F3342 Major depressive disorder, recurrent, in full remission: Secondary | ICD-10-CM

## 2022-07-26 DIAGNOSIS — Z23 Encounter for immunization: Secondary | ICD-10-CM

## 2022-07-26 DIAGNOSIS — Z1151 Encounter for screening for human papillomavirus (HPV): Secondary | ICD-10-CM | POA: Insufficient documentation

## 2022-07-26 DIAGNOSIS — Z716 Tobacco abuse counseling: Secondary | ICD-10-CM

## 2022-07-26 DIAGNOSIS — Z124 Encounter for screening for malignant neoplasm of cervix: Secondary | ICD-10-CM

## 2022-07-26 DIAGNOSIS — Z0000 Encounter for general adult medical examination without abnormal findings: Secondary | ICD-10-CM | POA: Diagnosis not present

## 2022-07-26 DIAGNOSIS — G40009 Localization-related (focal) (partial) idiopathic epilepsy and epileptic syndromes with seizures of localized onset, not intractable, without status epilepticus: Secondary | ICD-10-CM | POA: Insufficient documentation

## 2022-07-26 DIAGNOSIS — Z01419 Encounter for gynecological examination (general) (routine) without abnormal findings: Secondary | ICD-10-CM | POA: Insufficient documentation

## 2022-07-26 DIAGNOSIS — Z113 Encounter for screening for infections with a predominantly sexual mode of transmission: Secondary | ICD-10-CM | POA: Insufficient documentation

## 2022-07-26 DIAGNOSIS — F1220 Cannabis dependence, uncomplicated: Secondary | ICD-10-CM

## 2022-07-26 DIAGNOSIS — F1290 Cannabis use, unspecified, uncomplicated: Secondary | ICD-10-CM | POA: Insufficient documentation

## 2022-07-26 DIAGNOSIS — Z130 Encounter for screening for diseases of the blood and blood-forming organs and certain disorders involving the immune mechanism: Secondary | ICD-10-CM

## 2022-07-26 LAB — RESULTS CONSOLE HPV: CHL HPV: POSITIVE

## 2022-07-26 LAB — HM PAP SMEAR

## 2022-07-26 NOTE — Assessment & Plan Note (Signed)
Smokes about less than 0.5 pack/day  Asked about quitting: confirms that he/she currently smokes cigarettes Advise to quit smoking: Educated about QUITTING to reduce the risk of cancer, cardio and cerebrovascular disease. Assess willingness: Unwilling to quit at this time, but is working on cutting back. Assist with counseling and pharmacotherapy: Counseled for 5 minutes and literature provided. Arrange for follow up: follow up in 6-12 months and continue to offer help.

## 2022-07-26 NOTE — Assessment & Plan Note (Deleted)
Need to avoid smoking marijuana due to risk of addiction to illicit drugs, nausea, vomiting discussed.  

## 2022-07-26 NOTE — Assessment & Plan Note (Signed)
Annual exam as documented.  Counseling done include healthy lifestyle involving committing to 150 minutes of exercise per week, heart healthy diet, and attaining healthy weight. The importance of adequate sleep also discussed.  Regular use of seat belt and home safety were also discussed . Changes in health habits are decided on by patient with goals and time frames set for achieving them. Immunization and cancer screening  needs are specifically addressed at this visit.   Cervical Pa exam today, flu vaccine given today

## 2022-07-26 NOTE — Progress Notes (Signed)
Complete physical exam  Patient: Lisa Shaw   DOB: [birth year]   32 y.o. Female  MRN: [id number]  Subjective:    Chief Complaint  Patient presents with   Gynecologic Exam    AND FLU SHOT     Lisa Shaw is a 32 y.o. female with past medical history of seizures , chronic headaches, depression, GAD, who presents today for a complete physical exam. She reports consuming a general diet, does not exercise.   She does not have additional problems to discuss today.   Due for flu vaccine , flu vaccine given today, Cervical PAP exam completed.    Most recent fall risk assessment:    07/18/2022    2:44 PM  [place] in the past year? 0  Number falls in past yr: 0  Injury with Fall? 0  Risk for fall due to : No Fall Risks  Follow up Falls prevention discussed     Most recent depression screenings:    07/26/2022    1:12 PM 07/18/2022    2:46 PM  PHQ 2/9 Scores  PHQ - 2 Score 0 0        Patient Care Team: Renee Rival, FNP as PCP - General (Nurse Practitioner) Cameron Sprang, MD as Consulting Physician (Neurology)   Outpatient Medications Prior to Visit  Medication Sig Note   ondansetron (ZOFRAN) 4 MG tablet Take 1 tablet (4 mg total) by mouth every 8 (eight) hours as needed for nausea or vomiting. 07/26/2022: Prn    oxcarbazepine (TRILEPTAL) 600 MG tablet Take 1 tablet every night    topiramate (TOPAMAX) 100 MG tablet TAKE 3 TABLETS(300 MG) BY MOUTH AT BEDTIME    SUMAtriptan (IMITREX) 100 MG tablet Take by mouth. (Patient not taking: Reported on 07/26/2022)    [DISCONTINUED] citalopram (CELEXA) 20 MG tablet Take 1 tablet (20 mg total) by mouth at bedtime.    No facility-administered medications prior to visit.    Review of Systems  Constitutional: Negative.  Negative for chills, fever, malaise/fatigue and weight loss.  HENT: Negative.  Negative for ear discharge, ear pain, hearing loss, nosebleeds and tinnitus.   Eyes: Negative.  Negative  for photophobia, pain, discharge and redness.  Respiratory: Negative.  Negative for cough, hemoptysis, sputum production and shortness of breath.   Cardiovascular: Negative.  Negative for chest pain, palpitations, orthopnea and claudication.  Gastrointestinal: Negative.  Negative for abdominal pain, heartburn, nausea and vomiting.  Genitourinary: Negative.  Negative for dysuria, flank pain, frequency, hematuria and urgency.  Musculoskeletal: Negative.  Negative for back pain, myalgias and neck pain.  Skin: Negative.  Negative for itching and rash.  Neurological: Negative.  Negative for dizziness, tingling, tremors, sensory change, speech change and headaches.  Endo/Heme/Allergies: Negative.  Negative for environmental allergies and polydipsia. Does not bruise/bleed easily.  Psychiatric/Behavioral: Negative.  Negative for depression, memory loss and suicidal ideas. The patient does not have insomnia.           Objective:     BP 104/61   Pulse 84   Temp 98.1 F (36.7 C)   Ht 5\' 3"  (1.6 m)   Wt 187 lb 9.6 oz (85.1 kg)   LMP 07/21/2022   SpO2 100%   BMI 33.23 kg/m    Physical Exam Vitals and nursing note reviewed. Exam conducted with a chaperone present.  Constitutional:      General: She is not in acute distress.    Appearance: Normal appearance. She is obese.  She is not ill-appearing, toxic-appearing or diaphoretic.  HENT:     Head: Normocephalic and atraumatic.     Right Ear: Tympanic membrane, ear canal and external ear normal. There is no impacted cerumen.     Left Ear: Tympanic membrane, ear canal and external ear normal. There is no impacted cerumen.     Nose: Nose normal. No congestion or rhinorrhea.     Mouth/Throat:     Mouth: Mucous membranes are moist.     Pharynx: Oropharynx is clear. No oropharyngeal exudate or posterior oropharyngeal erythema.  Eyes:     General: No scleral icterus.       Right eye: No discharge.        Left eye: No discharge.      Extraocular Movements: Extraocular movements intact.     Conjunctiva/sclera: Conjunctivae normal.  Neck:     Vascular: No carotid bruit.  Cardiovascular:     Rate and Rhythm: Normal rate and regular rhythm.     Pulses: Normal pulses.     Heart sounds: Normal heart sounds. No murmur heard.    No friction rub. No gallop.  Pulmonary:     Effort: Pulmonary effort is normal. No respiratory distress.     Breath sounds: Normal breath sounds. No stridor. No wheezing, rhonchi or rales.  Chest:     Chest wall: No mass, lacerations, deformity, swelling, tenderness, crepitus or edema.  Breasts:    Tanner Score is 5.     Breasts are symmetrical.     Right: Normal. No swelling, bleeding, inverted nipple, mass, nipple discharge, skin change or tenderness.     Left: Normal. No swelling, bleeding, inverted nipple, mass, nipple discharge, skin change or tenderness.  Abdominal:     General: There is no distension.     Palpations: Abdomen is soft. There is no mass.     Tenderness: There is no abdominal tenderness. There is no right CVA tenderness, left CVA tenderness, guarding or rebound.     Hernia: No hernia is present. There is no hernia in the left inguinal area or right inguinal area.  Genitourinary:    General: Normal vulva.     Exam position: Lithotomy position.     Pubic Area: No rash or pubic lice.      Tanner stage (genital): 5.     Labia:        Right: No rash, tenderness, lesion or injury.        Left: No rash, tenderness, lesion or injury.      Urethra: No prolapse, urethral pain, urethral swelling or urethral lesion.     Vagina: No signs of injury and foreign body. No vaginal discharge, erythema, tenderness, bleeding, lesions or prolapsed vaginal walls.     Cervix: Normal. No eversion.     Uterus: Not deviated, not enlarged, not fixed, not tender and no uterine prolapse.      Adnexa: Right adnexa normal and left adnexa normal.       Right: No mass, tenderness or fullness.          Left: No mass, tenderness or fullness.    Musculoskeletal:        General: No swelling, tenderness, deformity or signs of injury.     Cervical back: Normal range of motion and neck supple. No rigidity or tenderness.     Right lower leg: No edema.     Left lower leg: No edema.  Lymphadenopathy:     Cervical: No cervical adenopathy.  Upper Body:     Right upper body: No supraclavicular, axillary or pectoral adenopathy.     Left upper body: No supraclavicular, axillary or pectoral adenopathy.     Lower Body: No right inguinal adenopathy. No left inguinal adenopathy.  Skin:    Capillary Refill: Capillary refill takes less than 2 seconds.     Coloration: Skin is not jaundiced or pale.     Findings: No bruising, erythema, lesion or rash.  Neurological:     Mental Status: She is alert and oriented to person, place, and time.     Cranial Nerves: No cranial nerve deficit.     Sensory: No sensory deficit.     Motor: No weakness.     Coordination: Coordination normal.     Gait: Gait normal.  Psychiatric:        Mood and Affect: Mood normal.        Behavior: Behavior normal.        Thought Content: Thought content normal.        Judgment: Judgment normal.      No results found for any visits on 07/26/22.     Assessment & Plan:    Routine Health Maintenance and Physical Exam  Immunization History  Administered Date(s) Administered   Influenza Inj Mdck Quad Pf 06/10/2017   Influenza,inj,Quad PF,6+ Mos 04/02/2019   PFIZER(Purple Top)SARS-COV-2 Vaccination 10/10/2019, 05/22/2020, 09/12/2020   Tdap 10/25/2016    Health Maintenance  Topic Date Due   Hepatitis C Screening  Never done   PAP SMEAR-Modifier  12/11/2021   INFLUENZA VACCINE  02/12/2022   COVID-19 Vaccine (4 - 2023-24 season) 03/15/2022   Medicare Annual Wellness (AWV)  07/19/2023   DTaP/Tdap/Td (2 - Td or Tdap) 10/26/2026   HIV Screening  Completed   HPV VACCINES  Aged Out    Discussed health benefits of physical  activity, and encouraged her to engage in regular exercise appropriate for her age and condition.  Problem List Items Addressed This Visit       Nervous and Auditory   Localization-related idiopathic epilepsy and epileptic syndromes with seizures of localized onset, not intractable, without status epilepticus ([place])    Continue  trileptal 600 mg tablet daily . Maintain close follow up with neurology.       Relevant Medications   SUMAtriptan (IMITREX) 100 MG tablet     Other   Major depressive disorder, recurrent episode, in full remission ([place])       07/26/2022    1:12 PM 07/18/2022    2:46 PM 12/28/2021    9:48 AM 03/26/2021   12:55 PM 03/05/2021    4:00 PM  Depression screen PHQ 2/9  Decreased Interest 0 0 0 0 0  Down, Depressed, Hopeless 0 0 0 0 0  PHQ - 2 Score 0 0 0 0 0  Altered sleeping   0 0 0  Tired, decreased energy   0 0 0  Change in appetite   0 0 0  Feeling bad or failure about yourself    0 0 0  Trouble concentrating   0 0 0  Moving slowly or fidgety/restless   0 0 0  Suicidal thoughts   0 0 0  PHQ-9 Score   0 0 0   Continue to monitor.       Tobacco abuse counseling    Smokes about less than 0.5 pack/day  Asked about quitting: confirms that he/she currently smokes cigarettes Advise to quit smoking: Educated about QUITTING to reduce  the risk of cancer, cardio and cerebrovascular disease. Assess willingness: Unwilling to quit at this time, but is working on cutting back. Assist with counseling and pharmacotherapy: Counseled for 5 minutes and literature provided. Arrange for follow up: follow up in 6-12 months and continue to offer help.       Annual physical exam - Primary    Annual exam as documented.  Counseling done include healthy lifestyle involving committing to 150 minutes of exercise per week, heart healthy diet, and attaining healthy weight. The importance of adequate sleep also discussed.  Regular use of seat belt and home safety were also discussed  . Changes in health habits are decided on by patient with goals and time frames set for achieving them. Immunization and cancer screening  needs are specifically addressed at this visit.   Cervical Pa exam today, flu vaccine given today       Screening for endocrine, nutritional, metabolic and immunity disorder   Relevant Orders   CMP14+EGFR   Hemoglobin A1c   Lipid Panel   Hepatitis C antibody   TSH   Vitamin D, 25-hydroxy   Need for influenza vaccination    Patient educated on CDC recommendation for the influenza  vaccine. Verbal consent was obtained from the patient, vaccine administered by nurse, no sign of adverse reactions noted at this time. Patient education on arm soreness and use of tylenol for this patient  was discussed. Patient educated on the signs and symptoms of adverse effect and advise to contact the office if they occur.       Cannabis dependence, uncomplicated (HCC)    Need to avoid smoking marijuana due to risk of addiction to illicit drugs, nausea, vomiting discussed.       RESOLVED: Marijuana smoker   Other Visit Diagnoses     Screening for cervical cancer       Relevant Orders   Cytology - PAP([place])      Return in about 1 year (around 07/27/2023) for CPE.     Donell Beers, FNP

## 2022-07-26 NOTE — Assessment & Plan Note (Signed)
Continue  trileptal 600 mg tablet daily . Maintain close follow up with neurology.

## 2022-07-26 NOTE — Patient Instructions (Addendum)
Please get your fasting labs done next week as dicussed . Flu vaccine today    It is important that you exercise regularly at least 30 minutes 5 times a week as tolerated  Think about what you will eat, plan ahead. Choose " clean, green, fresh or frozen" over canned, processed or packaged foods which are more sugary, salty and fatty. 70 to 75% of food eaten should be vegetables and fruit. Three meals at set times with snacks allowed between meals, but they must be fruit or vegetables. Aim to eat over a 12 hour period , example 7 am to 7 pm, and STOP after  your last meal of the day. Drink water,generally about 64 ounces per day, no other drink is as healthy. Fruit juice is best enjoyed in a healthy way, by EATING the fruit.  Thanks for choosing [facility] we consider it a privelige to serve you.

## 2022-07-26 NOTE — Assessment & Plan Note (Signed)
Need to avoid smoking marijuana due to risk of addiction to illicit drugs, nausea, vomiting discussed.

## 2022-07-26 NOTE — Addendum Note (Signed)
Addended by: Elyse Jarvis on: 07/26/2022 03:11 PM   Modules accepted: Orders

## 2022-07-26 NOTE — Assessment & Plan Note (Signed)
07/26/2022    1:12 PM 07/18/2022    2:46 PM 12/28/2021    9:48 AM 03/26/2021   12:55 PM 03/05/2021    4:00 PM  Depression screen PHQ 2/9  Decreased Interest 0 0 0 0 0  Down, Depressed, Hopeless 0 0 0 0 0  PHQ - 2 Score 0 0 0 0 0  Altered sleeping   0 0 0  Tired, decreased energy   0 0 0  Change in appetite   0 0 0  Feeling bad or failure about yourself    0 0 0  Trouble concentrating   0 0 0  Moving slowly or fidgety/restless   0 0 0  Suicidal thoughts   0 0 0  PHQ-9 Score   0 0 0   Continue to monitor.

## 2022-07-26 NOTE — Assessment & Plan Note (Signed)
Patient educated on CDC recommendation for the influenza vaccine. Verbal consent was obtained from the patient, vaccine administered by nurse, no sign of adverse reactions noted at this time. Patient education on arm soreness and use of tylenol for this patient  was discussed. Patient educated on the signs and symptoms of adverse effect and advise to contact the office if they occur.  

## 2022-07-29 ENCOUNTER — Other Ambulatory Visit: Payer: Self-pay

## 2022-08-01 LAB — CYTOLOGY - PAP
Chlamydia: NEGATIVE
Comment: NEGATIVE
Comment: NEGATIVE
Comment: NEGATIVE
Comment: NORMAL
Diagnosis: UNDETERMINED — AB
High risk HPV: POSITIVE — AB
Neisseria Gonorrhea: NEGATIVE
Trichomonas: NEGATIVE

## 2022-08-02 ENCOUNTER — Other Ambulatory Visit: Payer: Self-pay | Admitting: Nurse Practitioner

## 2022-08-02 DIAGNOSIS — R87610 Atypical squamous cells of undetermined significance on cytologic smear of cervix (ASC-US): Secondary | ICD-10-CM

## 2022-08-02 DIAGNOSIS — B9689 Other specified bacterial agents as the cause of diseases classified elsewhere: Secondary | ICD-10-CM

## 2022-08-02 MED ORDER — METRONIDAZOLE 500 MG PO TABS: 500.0000 mg | ORAL_TABLET | Freq: Two times a day (BID) | ORAL | 0 refills | Status: AC

## 2022-08-02 NOTE — Progress Notes (Signed)
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 I have referred you to [facility] for further recommendations .  Please get your fasting labs done as dicussed.

## 2022-08-02 NOTE — Progress Notes (Signed)
For Bacterial Vaginosis she should take flagyl 500mg  BID for 7 days , med sent to pharmacy

## 2022-08-05 ENCOUNTER — Other Ambulatory Visit: Payer: 59

## 2022-08-05 DIAGNOSIS — Z130 Encounter for screening for diseases of the blood and blood-forming organs and certain disorders involving the immune mechanism: Secondary | ICD-10-CM

## 2022-08-07 ENCOUNTER — Other Ambulatory Visit: Payer: 59

## 2022-08-07 ENCOUNTER — Other Ambulatory Visit: Payer: Self-pay

## 2022-08-07 ENCOUNTER — Other Ambulatory Visit: Payer: Self-pay | Admitting: Nurse Practitioner

## 2022-08-07 DIAGNOSIS — E559 Vitamin D deficiency, unspecified: Secondary | ICD-10-CM

## 2022-08-07 DIAGNOSIS — Z130 Encounter for screening for diseases of the blood and blood-forming organs and certain disorders involving the immune mechanism: Secondary | ICD-10-CM

## 2022-08-07 DIAGNOSIS — Z0000 Encounter for general adult medical examination without abnormal findings: Secondary | ICD-10-CM

## 2022-08-07 LAB — CMP14+EGFR

## 2022-08-07 LAB — HEMOGLOBIN A1C
Est. average glucose Bld gHb Est-mCnc: 97 mg/dL
Hgb A1c MFr Bld: 5 % (ref 4.8–5.6)

## 2022-08-07 LAB — TSH

## 2022-08-07 LAB — VITAMIN D 25 HYDROXY (VIT D DEFICIENCY, FRACTURES): Vit D, 25-Hydroxy: 8.4 ng/mL — ABNORMAL LOW (ref 30.0–100.0)

## 2022-08-07 LAB — LIPID PANEL

## 2022-08-07 LAB — HEPATITIS C ANTIBODY: Hep C Virus Ab: NONREACTIVE

## 2022-08-07 MED ORDER — VITAMIN D (ERGOCALCIFEROL) 1.25 MG (50000 UNIT) PO CAPS: 50000.0000 [IU] | ORAL_CAPSULE | ORAL | 0 refills | Status: DC

## 2022-08-07 NOTE — Progress Notes (Signed)
Most of her labs were not processed please contact the patient so we can have her labs redrawn.   Vitamin D level is low, start taking vitamin D 50,000 units once weekly for 8 weeks, after 8 weeks take vitamin D 1000 units daily ,eat foods rich in vitamin d , get early morning sunshine.  follow up in 3 months to recheck her vitamin D level, please schedule this appointment .

## 2022-08-07 NOTE — Progress Notes (Signed)
Results have been relayed to the patient. The patient verbalized understanding. No questions at this time.  She has appts for labs this am. Orders are in.

## 2022-08-08 LAB — CMP14+EGFR
ALT: 18 IU/L (ref 0–32)
AST: 17 IU/L (ref 0–40)
Albumin/Globulin Ratio: 1.7 (ref 1.2–2.2)
Albumin: 4.5 g/dL (ref 4.0–5.0)
Alkaline Phosphatase: 132 IU/L — ABNORMAL HIGH (ref 44–121)
BUN/Creatinine Ratio: 13 (ref 9–23)
BUN: 10 mg/dL (ref 6–20)
Bilirubin Total: 0.2 mg/dL (ref 0.0–1.2)
CO2: 15 mmol/L — ABNORMAL LOW (ref 20–29)
Calcium: 9.4 mg/dL (ref 8.7–10.2)
Chloride: 108 mmol/L — ABNORMAL HIGH (ref 96–106)
Creatinine, Ser: 0.8 mg/dL (ref 0.57–1.00)
Globulin, Total: 2.6 g/dL (ref 1.5–4.5)
Glucose: 98 mg/dL (ref 70–99)
Potassium: 3.5 mmol/L (ref 3.5–5.2)
Sodium: 143 mmol/L (ref 134–144)
Total Protein: 7.1 g/dL (ref 6.0–8.5)
eGFR: 102 mL/min/{1.73_m2} (ref >59)

## 2022-08-08 LAB — LIPID PANEL
Chol/HDL Ratio: 3.7 ratio (ref 0.0–4.4)
Cholesterol, Total: 179 mg/dL (ref 100–199)
HDL: 48 mg/dL (ref >39)
LDL Chol Calc (NIH): 113 mg/dL — ABNORMAL HIGH (ref 0–99)
Triglycerides: 99 mg/dL (ref 0–149)
VLDL Cholesterol Cal: 18 mg/dL (ref 5–40)

## 2022-08-08 LAB — TSH: TSH: 0.777 u[IU]/mL (ref 0.450–4.500)

## 2022-08-09 NOTE — Progress Notes (Signed)
Hyperlipidemia . Eat a healthy diet, including lots of fruits and vegetables. Avoid foods with a lot of saturated and trans fats, such as red meat, butter, fried foods and cheese . Maintain a healthy weight.  Liver enzymes is slightly elevated, avoid alcohol and excessive use of tylenol.   Other labs are stable

## 2022-08-23 ENCOUNTER — Encounter: Payer: Self-pay | Admitting: Neurology

## 2022-08-23 ENCOUNTER — Ambulatory Visit (INDEPENDENT_AMBULATORY_CARE_PROVIDER_SITE_OTHER): Payer: 59 | Admitting: Neurology

## 2022-08-23 VITALS — BP 116/75 | HR 87 | Ht 64.0 in | Wt 186.8 lb

## 2022-08-23 DIAGNOSIS — G40009 Localization-related (focal) (partial) idiopathic epilepsy and epileptic syndromes with seizures of localized onset, not intractable, without status epilepticus: Secondary | ICD-10-CM | POA: Diagnosis not present

## 2022-08-23 DIAGNOSIS — G43009 Migraine without aura, not intractable, without status migrainosus: Secondary | ICD-10-CM

## 2022-08-23 MED ORDER — SUMATRIPTAN SUCCINATE 100 MG PO TABS: ORAL_TABLET | ORAL | 11 refills | Status: DC

## 2022-08-23 MED ORDER — TOPIRAMATE 100 MG PO TABS: ORAL_TABLET | ORAL | 11 refills | Status: DC

## 2022-08-23 MED ORDER — OXCARBAZEPINE 600 MG PO TABS: ORAL_TABLET | ORAL | 11 refills | Status: DC

## 2022-08-23 NOTE — Progress Notes (Signed)
NEUROLOGY FOLLOW UP OFFICE NOTE  Lisa Shaw SE:[id number] [birth year]  HISTORY OF PRESENT ILLNESS: I had the pleasure of seeing Lisa Shaw in follow-up in the neurology clinic on 08/23/2022.  The patient was last seen a year ago for bilateral tonic-clonic epilepsy and migraines. Prior EEG reported left frontal sharp waves. MRI brain motion-degraded but no acute changes. Since her last visit, she continues to do well with no seizures since June 2020 on Oxcarbazepine 653m qhs and Topiramate 3040mqhs without side effects. She denies any staring/unresponsive episodes, gaps in time, olfactory/gustatory hallucinations, focal numbness/tingling/weakness, myoclonic jerks. She is also taking the Topiramate for migraine prophylaxis and reports migraines have been okay. She ran out of Imitrex and notes it would help for migraine rescue. Over the past year, she has noticed tenderness in the vertex region when she braids her hair or pulls hair back. They are not associated with the migraines, no nausea/vomiting. She does not take any medication for it. Sometimes she has tension on the right temple. Sleep is good. No falls. Mood is okay. She lives alone. 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 She does not drive. No pregnancy plans.    History on Initial Assessment 12/02/2017: This is a pleasant 32 year old right-handed woman with a history of seizures since 2013. [facility] and will be summarized here, she is depressed in the office today and is a poor historian. She initially had nocturnal seizures with described diffuse rhythmic jerking with eye rolling backwards. She had an EEG in 06/2012 reporting left frontal sharps. There is also note of episodes in 2012 where she would have staring spells lasting around 2 minutes with associated diaphoresis and post-ictal confusion with violence usually at night or early in the morning. Both seizure types were  associated with tongue bite and urinary incontinence. There is report of deja vu, and she reports tingling in both hands during the "stare offs." She lives with her mother but is mostly by herself at home during the day, and feels she has the "stare off" episodes around twice a week. She was initially started on Keppra which caused mood issues, then switched to Topamax. It appears she initially had mental slowing on Topamax, but has been taking 30029mhs since 2015. [facility] and was started on Vimpat which she stopped again due to side effects. She reports the last nocturnal convulsion was in June 2018 when she woke up feeling weird with her tongue sore and with a headache. She was given a headache cocktail but was also noted to be very anxious and depressed. [facility] for suicidal ideation. She currently denies any suicidal ideation but continues to report significant depression. She reports body twitches in her arms or legs. She has headaches on a daily basis, taking an average of 2 Tylenol or BC powder daily. She feels it is due to "overthinking or depression." She has sleep difficulties with interrupted 5-6 hours of sleep at night. She feels fatigued and drowsy during the day. She has dizziness when standing. Vision is occasionally blurred when watching TV. She has noticed swallowing difficulties. She notices her right foot gets numb frequently and her balance is off. No falls. 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    Epilepsy Risk Factors:  Her  maternal great grandmother and great aunt had seizures. Otherwise she had a normal birth and early development.  There is no history of febrile convulsions, CNS infections such as meningitis/encephalitis, significant traumatic brain injury, neurosurgical procedures.   Prior AEDs: Keppra,  Vimpat Laboratory Data:  EEGs: EEG in 06/2012 reported as abnormal secondary to focal sharp wave activity in the left frontal region MRI: I personally reviewed MRI brain without contrast done 06/2012 which was degraded by motion. Lateral ventricles are mildly prominent in size with a slightly rounded appearance, no transependymal CSF flow. Hippocampi appear symmetric with no abnormal signal seen.    PAST MEDICAL HISTORY: Past Medical History:  Diagnosis Date   Acne vulgaris    Depression 04/29/2017   Epilepsy ([place])    Migraine    Seizures ([place]) 07/18/2016   Sleep disturbance 04/29/2017   Vitamin D deficiency 06/2020    MEDICATIONS: Current Outpatient Medications on File Prior to Visit  Medication Sig Dispense Refill   ondansetron (ZOFRAN) 4 MG tablet Take 1 tablet (4 mg total) by mouth every 8 (eight) hours as needed for nausea or vomiting. 20 tablet 2   oxcarbazepine (TRILEPTAL) 600 MG tablet Take 1 tablet every night 30 tablet 11   SUMAtriptan (IMITREX) 100 MG tablet Take by mouth. (Patient not taking: Reported on 07/26/2022)     topiramate (TOPAMAX) 100 MG tablet TAKE 3 TABLETS(300 MG) BY MOUTH AT BEDTIME 90 tablet 1   Vitamin D, Ergocalciferol, (DRISDOL) 1.25 MG (50000 UNIT) CAPS capsule Take 1 capsule (50,000 Units total) by mouth every 7 (seven) days. 8 capsule 0   No current facility-administered medications on file prior to visit.    ALLERGIES: No Known Allergies  FAMILY HISTORY: Family History  Problem Relation Age of Onset   Hypertension Mother    Prostate cancer Father    Pancreatic cancer Father    Seizures Other    Breast cancer Neg Hx    Colon cancer Neg Hx     SOCIAL HISTORY: Social History   Socioeconomic History   Marital status: Single    Spouse name: Not on file   Number of children: 0   Years of education: 12th   Highest education level: Not on file  Occupational History    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[employer] Use   Smoking status:  Every Day    Packs/day: 0.25    Years: 4.00    Total pack years: 1.00    Types: Cigarettes   Smokeless tobacco: Never   Tobacco comments:    6-7 cigarettes per day  Vaping Use   Vaping Use: Never used  Substance and Sexual Activity   Alcohol use: No   Drug use: Yes    Types: Marijuana    Comment: trying to quit   Sexual activity: Yes    Birth control/protection: Condom  Other Topics Concern   Not on file  Social History Narrative   Patient lives home alone    Right handed       Social Determinants of Health   Financial Resource Strain: Low Risk  (07/18/2022)   Overall Financial Resource Strain (CARDIA)    Difficulty of Paying Living Expenses: Not hard at all  Food Insecurity: No Food Insecurity (07/18/2022)   Hunger Vital Sign    Worried About Running Out of Food in the Last Year: Never true    Ran Out of Food in the Last Year: Never true  Transportation Needs: No Transportation Needs (07/18/2022)   [place] -  Hydrologist (Medical): No    Lack of Transportation (Non-Medical): No  Physical Activity: Sufficiently Active (07/18/2022)   Exercise Vital Sign    Days of Exercise per Week: 6 days    Minutes of Exercise per Session: 40 min  Stress: No Stress Concern Present (07/18/2022)   [place]    Feeling of Stress : Not at all  Social Connections: [place] (07/18/2022)   Social Connection and Isolation Panel [NHANES]    Frequency of Communication with Friends and Family: More than three times a week    Frequency of Social Gatherings with Friends and Family: More than three times a week    Attends Religious Services: More than 4 times per year    Active Member of [employer] or Organizations: Yes    Attends Archivist Meetings: More than 4 times per year    Marital Status: Married  Human resources officer Violence: Not At Risk (07/18/2022)   Humiliation, Afraid, Rape, and Kick  questionnaire    Fear of Current or Ex-Partner: No    Emotionally Abused: No    Physically Abused: No    Sexually Abused: No     PHYSICAL EXAM: Vitals:   08/23/22 0950  BP: 116/75  Pulse: 87  SpO2: 100%   General: No acute distress Head:  Normocephalic/atraumatic, no scalp tenderness today Skin/Extremities: No rash, no edema Neurological Exam: alert and awake. No aphasia or dysarthria. Fund of knowledge is appropriate.  Attention and concentration are normal.   Cranial nerves: Pupils equal, round. Extraocular movements intact with no nystagmus. Visual fields full.  No facial asymmetry.  Motor: Bulk and tone normal, muscle strength 5/5 throughout with no pronator drift.   Finger to nose testing intact.  Gait narrow-based and steady, able to tandem walk adequately.  Romberg negative.   IMPRESSION: This is a pleasant 32 yo RH woman with a history of depression, migraines, and seizures. 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 Prior EEG in 2013 reported left frontal sharp waves, MRI degraded by motion but no acute changes seen. She has been seizure-free since June 2020 on oxcarbazepine 633m qhs and topiramate 3069mqhs (also for migraine prophylaxis). Refills for sumatriptan will also be sent today.  We discussed the scalp tenderness, none today, exam normal. Likely related to pulling hair back, she will try giving the braids a holiday and monitor symptoms. No pregnancy plans. She does not drive. Follow-up in 1 year, call for any changes.   Thank you for allowing me to participate in her care.  Please do not hesitate to call for any questions or concerns.    KaEllouise NewerM.D.   CC: FoVena RuaFNP

## 2022-08-23 NOTE — Patient Instructions (Signed)
Good to see you. Continue all your medications. Try giving your scalp a rest and see if the tenderness still happens. Follow-up in 1 year, call for any changes  Seizure Precautions: 1. If medication has been prescribed for you to prevent seizures, take it exactly as directed.  Do not stop taking the medicine without talking to your doctor first, even if you have not had a seizure in a long time.   2. Avoid activities in which a seizure would cause danger to yourself or to others.  Don't operate dangerous machinery, swim alone, or climb in high or dangerous places, such as on ladders, roofs, or girders.  Do not drive unless your doctor says you may.  3. If you have any warning that you may have a seizure, lay down in a safe place where you can't hurt yourself.    4.  No driving for 6 months from last seizure, as per [facility].   Please refer to the following link on the [facility] website for more information: http://www.epilepsyfoundation.org/answerplace/Social/driving/drivingu.cfm   5.  Maintain good sleep hygiene.  6.  Notify your neurology if you are planning pregnancy or if you become pregnant.  7.  Contact your doctor if you have any problems that may be related to the medicine you are taking.  8.  Call 911 and bring the patient back to the ED if:        A.  The seizure lasts longer than 5 minutes.       B.  The patient doesn't awaken shortly after the seizure  C.  The patient has new problems such as difficulty seeing, speaking or moving  D.  The patient was injured during the seizure  E.  The patient has a temperature over 102 F (39C)  F.  The patient vomited and now is having trouble breathing

## 2022-09-05 ENCOUNTER — Other Ambulatory Visit (HOSPITAL_COMMUNITY): Payer: Self-pay

## 2022-09-17 ENCOUNTER — Encounter: Payer: Self-pay | Admitting: Medical

## 2022-09-17 ENCOUNTER — Ambulatory Visit (INDEPENDENT_AMBULATORY_CARE_PROVIDER_SITE_OTHER): Payer: 59 | Admitting: Medical

## 2022-09-17 ENCOUNTER — Other Ambulatory Visit (HOSPITAL_COMMUNITY)
Admission: RE | Admit: 2022-09-17 | Discharge: 2022-09-17 | Disposition: A | Discharge status: Home / Self Care | Payer: 59 | Source: Ambulatory Visit | Attending: Medical | Admitting: Medical

## 2022-09-17 VITALS — BP 124/77 | HR 106 | Ht 65.0 in | Wt 187.6 lb

## 2022-09-17 DIAGNOSIS — R87610 Atypical squamous cells of undetermined significance on cytologic smear of cervix (ASC-US): Secondary | ICD-10-CM

## 2022-09-17 HISTORY — DX: Atypical squamous cells of undetermined significance on cytologic smear of cervix (ASC-US): R87.610

## 2022-09-17 LAB — POCT URINE PREGNANCY: Preg Test, Ur: NEGATIVE

## 2022-09-17 NOTE — Progress Notes (Signed)
Pt presents colpo. LMP 08-21-22. No concerns at this time.

## 2022-09-17 NOTE — Progress Notes (Signed)
    GYNECOLOGY CLINIC COLPOSCOPY PROCEDURE NOTE  Ms. Lisa Shaw is a 32 y.o. G0P0000 here for colposcopy for ASCUS with POSITIVE high risk HPV pap smear on 07/26/22. Discussed role for HPV in cervical dysplasia, need for surveillance.  Patient given informed consent, signed copy in the chart, time out was performed.  Placed in lithotomy position. Cervix viewed with speculum and colposcope after application of acetic acid.   Colposcopy adequate? Yes  acetowhite lesion(s) noted at 6 o'clock; biopsies obtained.  ECC specimen obtained. All specimens were labelled and sent to pathology.  Patient was given post procedure instructions.  Will follow up pathology and manage accordingly.  Routine preventative health maintenance measures emphasized.   Luvenia Redden, PA-C 09/17/2022 10:01 AM

## 2022-09-17 NOTE — Progress Notes (Signed)
NGYN pt presents for Colpo.  LMP: Last Pap: 07/26/22 +HPV +ASCUS

## 2022-09-18 ENCOUNTER — Telehealth: Payer: Self-pay

## 2022-09-18 NOTE — Telephone Encounter (Signed)
Patient called and left message stating that she has some questions regarding her visit yesterday.  Returned patient call. No answer. LVM for patient to return call to office

## 2022-09-19 LAB — SURGICAL PATHOLOGY

## 2022-09-25 ENCOUNTER — Other Ambulatory Visit (HOSPITAL_COMMUNITY): Payer: Self-pay

## 2022-11-07 ENCOUNTER — Telehealth: Payer: Self-pay | Admitting: Nurse Practitioner

## 2022-11-07 ENCOUNTER — Telehealth: Payer: Self-pay

## 2022-11-07 NOTE — Telephone Encounter (Signed)
Returned call, no answer, left vm 

## 2022-11-07 NOTE — Telephone Encounter (Signed)
Pt asked if she ever had a TB shot here done before and wants a call back!

## 2022-11-11 NOTE — Telephone Encounter (Signed)
Sent pt a my chart message to ask. KH

## 2023-01-28 ENCOUNTER — Other Ambulatory Visit: Payer: Self-pay | Admitting: Nurse Practitioner

## 2023-01-28 ENCOUNTER — Telehealth: Payer: Self-pay | Admitting: Neurology

## 2023-01-28 DIAGNOSIS — R110 Nausea: Secondary | ICD-10-CM

## 2023-01-28 NOTE — Telephone Encounter (Signed)
 Please advise KH 

## 2023-01-28 NOTE — Telephone Encounter (Signed)
Pt is calling to check the status and she is aware that we ask for 24-48 hrs for any messages that have been left.

## 2023-01-28 NOTE — Telephone Encounter (Signed)
Patient would like to speak with someone about a disability sticker.

## 2023-01-29 NOTE — Telephone Encounter (Signed)
She is talking about the parking placard she is not driving she wants it for her mom to use when she Is with her mom

## 2023-01-29 NOTE — Telephone Encounter (Signed)
Pls confirm what she needs, I thought she is not driving, is she driving now? Is she talking about the parking placard?

## 2023-02-24 ENCOUNTER — Encounter: Payer: Self-pay | Admitting: Nurse Practitioner

## 2023-02-24 ENCOUNTER — Ambulatory Visit (INDEPENDENT_AMBULATORY_CARE_PROVIDER_SITE_OTHER): Payer: 59 | Admitting: Nurse Practitioner

## 2023-02-24 VITALS — BP 113/70 | HR 104 | Ht <= 58 in | Wt 178.8 lb

## 2023-02-24 DIAGNOSIS — Z113 Encounter for screening for infections with a predominantly sexual mode of transmission: Secondary | ICD-10-CM

## 2023-02-24 DIAGNOSIS — Z7251 High risk heterosexual behavior: Secondary | ICD-10-CM

## 2023-02-24 LAB — POCT URINE PREGNANCY: Preg Test, Ur: NEGATIVE

## 2023-02-24 NOTE — Addendum Note (Signed)
Addended by: Donell Beers on: 02/24/2023 04:26 PM   Modules accepted: Orders

## 2023-02-24 NOTE — Patient Instructions (Signed)

## 2023-02-24 NOTE — Assessment & Plan Note (Addendum)
Patient encouraged to maintain 1 sexual partner to reduce risk of STI STD testing ordered

## 2023-02-24 NOTE — Assessment & Plan Note (Signed)
Urine negative for pregnancy

## 2023-02-24 NOTE — Progress Notes (Addendum)
Acute Office Visit  Subjective:     Patient ID: Lisa Shaw, female    DOB: Jan 13, 1991, 32 y.o.   MRN: [id number]  Chief Complaint  Patient presents with   std testing    Wants to be tested for all STDS. Unsure of exposure.     HPI Lisa Shaw  has a past medical history of Acne vulgaris, Depression (04/29/2017), Epilepsy (HCC), Migraine, Seizures (HCC) (07/18/2016), Sleep disturbance (04/29/2017), and Vitamin D deficiency (06/2020). Patient is in today for STD testing , has had 2 sexual partners recently.  She denies fever chills vaginitis, vaginal discharge, rashes.  Need to avoid high risk sexual behavior discussed in the office today   Lab Unable to get blood today , patient will return for labs this week.    Review of Systems  Constitutional:  Negative for activity change, appetite change, chills, fatigue and fever.  HENT:  Negative for congestion, dental problem, ear discharge, ear pain, hearing loss, rhinorrhea, sinus pressure, sinus pain, sneezing and sore throat.   Eyes:  Negative for pain, discharge, redness and itching.  Respiratory:  Negative for cough, chest tightness, shortness of breath and wheezing.   Cardiovascular:  Negative for chest pain, palpitations and leg swelling.  Gastrointestinal:  Negative for abdominal distention, abdominal pain, anal bleeding, blood in stool, constipation, diarrhea, nausea, rectal pain and vomiting.  Endocrine: Negative for cold intolerance, heat intolerance, polydipsia, polyphagia and polyuria.  Genitourinary:  Negative for difficulty urinating, dysuria, flank pain, frequency, hematuria, menstrual problem, pelvic pain and vaginal bleeding.  Musculoskeletal:  Negative for arthralgias, back pain, gait problem, joint swelling and myalgias.  Skin:  Negative for color change, pallor, rash and wound.  Allergic/Immunologic: Negative for environmental allergies, food allergies and immunocompromised state.  Neurological:  Negative for  dizziness, tremors, facial asymmetry, weakness and headaches.  Hematological:  Negative for adenopathy. Does not bruise/bleed easily.  Psychiatric/Behavioral:  Negative for agitation, behavioral problems, confusion, decreased concentration, hallucinations, self-injury and suicidal ideas.         Objective:    BP 113/70   Pulse (!) 104   Ht 1' (0.305 m)   Wt 178 lb 12.8 oz (81.1 kg)   LMP 02/04/2023 (Approximate)   HC 16" (40.6 cm)   BMI 872.99 kg/m    Physical Exam Vitals and nursing note reviewed.  Constitutional:      General: She is not in acute distress.    Appearance: Normal appearance. She is not ill-appearing, toxic-appearing or diaphoretic.  HENT:     Mouth/Throat:     Mouth: Mucous membranes are moist.     Pharynx: Oropharynx is clear. No oropharyngeal exudate or posterior oropharyngeal erythema.  Eyes:     General: No scleral icterus.       Right eye: No discharge.        Left eye: No discharge.     Extraocular Movements: Extraocular movements intact.     Conjunctiva/sclera: Conjunctivae normal.  Cardiovascular:     Rate and Rhythm: Normal rate and regular rhythm.     Pulses: Normal pulses.     Heart sounds: Normal heart sounds. No murmur heard.    No friction rub. No gallop.  Pulmonary:     Effort: Pulmonary effort is normal. No respiratory distress.     Breath sounds: Normal breath sounds. No stridor. No wheezing, rhonchi or rales.  Chest:     Chest wall: No tenderness.  Abdominal:     General: There is no distension.  Palpations: Abdomen is soft.     Tenderness: There is no abdominal tenderness. There is no right CVA tenderness, left CVA tenderness or guarding.  Musculoskeletal:        General: No swelling, tenderness, deformity or signs of injury.     Right lower leg: No edema.     Left lower leg: No edema.  Skin:    General: Skin is warm and dry.     Capillary Refill: Capillary refill takes less than 2 seconds.     Coloration: Skin is not  jaundiced or pale.     Findings: No bruising, erythema or lesion.  Neurological:     Mental Status: She is alert and oriented to person, place, and time.     Motor: No weakness.     Coordination: Coordination normal.     Gait: Gait normal.  Psychiatric:        Mood and Affect: Mood normal.        Behavior: Behavior normal.        Thought Content: Thought content normal.        Judgment: Judgment normal.     Results for orders placed or performed in visit on 02/24/23  POCT urine pregnancy  Result Value Ref Range   Preg Test, Ur Negative Negative        Assessment & Plan:   Problem List Items Addressed This Visit       Other   Screening examination for STI    Patient encouraged to maintain 1 sexual partner to reduce risk of STI STD testing ordered      Relevant Orders   Chlamydia/Gonococcus/Trichomonas, NAA   HIV Antibody (routine testing w rflx)   Hepatitis C antibody   RPR   Unprotected sex - Primary    Urine negative for pregnancy      Relevant Orders   POCT urine pregnancy (Completed)    No orders of the defined types were placed in this encounter.   Return in about 6 months (around 08/27/2023) for CPE.  Donell Beers, FNP

## 2023-02-26 ENCOUNTER — Other Ambulatory Visit: Payer: 59

## 2023-02-26 DIAGNOSIS — Z113 Encounter for screening for infections with a predominantly sexual mode of transmission: Secondary | ICD-10-CM

## 2023-02-27 ENCOUNTER — Other Ambulatory Visit: Payer: Self-pay

## 2023-02-27 LAB — HIV ANTIBODY (ROUTINE TESTING W REFLEX): HIV Screen 4th Generation wRfx: NONREACTIVE

## 2023-02-27 LAB — HEPATITIS C ANTIBODY: Hep C Virus Ab: NONREACTIVE

## 2023-02-27 LAB — RPR: RPR Ser Ql: NONREACTIVE

## 2023-03-27 NOTE — Telephone Encounter (Signed)
Pt called informed that parking placard is ready she would like for it to be mailed to her

## 2023-03-27 NOTE — Telephone Encounter (Signed)
Done, thanks

## 2023-04-18 ENCOUNTER — Telehealth: Payer: Self-pay | Admitting: Nurse Practitioner

## 2023-04-18 ENCOUNTER — Encounter: Payer: Self-pay | Admitting: Nurse Practitioner

## 2023-04-18 ENCOUNTER — Ambulatory Visit (INDEPENDENT_AMBULATORY_CARE_PROVIDER_SITE_OTHER): Payer: 59 | Admitting: Nurse Practitioner

## 2023-04-18 VITALS — BP 129/82 | HR 94 | Ht 63.25 in | Wt 178.4 lb

## 2023-04-18 DIAGNOSIS — R300 Dysuria: Secondary | ICD-10-CM

## 2023-04-18 DIAGNOSIS — N898 Other specified noninflammatory disorders of vagina: Secondary | ICD-10-CM | POA: Diagnosis not present

## 2023-04-18 LAB — POCT URINALYSIS DIPSTICK
Bilirubin, UA: NEGATIVE
Glucose, UA: NEGATIVE
Ketones, UA: NEGATIVE
Nitrite, UA: NEGATIVE
Protein, UA: POSITIVE — AB
Spec Grav, UA: 1.025 (ref 1.010–1.025)
Urobilinogen, UA: 0.2 U/dL
pH, UA: 7 (ref 5.0–8.0)

## 2023-04-18 MED ORDER — FLUCONAZOLE 150 MG PO TABS: 150.0000 mg | ORAL_TABLET | Freq: Every day | ORAL | 0 refills | Status: DC

## 2023-04-18 MED ORDER — SULFAMETHOXAZOLE-TRIMETHOPRIM 800-160 MG PO TABS
1.0000 | ORAL_TABLET | Freq: Two times a day (BID) | ORAL | 0 refills | Status: AC
Start: 2023-04-18 — End: 2023-04-21

## 2023-04-18 NOTE — Telephone Encounter (Signed)
Pt was made an appointment. Kh

## 2023-04-18 NOTE — Telephone Encounter (Signed)
Patient seen today

## 2023-04-18 NOTE — Progress Notes (Signed)
Subjective   Patient ID: Lisa Shaw, female    DOB: 1991/05/27, 32 y.o.   MRN: [id number]  Chief Complaint  Patient presents with   Dysuria    Started yesterday but really bad today. There is some brown/reddish when wiping. Skin is really irritated. Finished abx for abscess 2 weeks ago, unsure if yeast infection    Referring provider: Donell Beers, FNP  Lisa Shaw is a 32 y.o. female with Past Medical History: No date: Acne vulgaris 04/29/2017: Depression No date: Epilepsy (HCC) No date: Migraine 07/18/2016: Seizures (HCC) 04/29/2017: Sleep disturbance 06/2020: Vitamin D deficiency   Dysuria     Patient presents today for an acute visit.  She states that she has been having UTI symptoms including dysuria, frequency urgency for the past 3 days.  She has also been having vaginal irritation and itching.  UTI is positive for leukocytes.  We will order Bactrim and Diflucan.  We will send urine for culture. Denies f/c/s, n/v/d, hemoptysis, PND, leg swelling Denies chest pain or edema        No Known Allergies  Immunization History  Administered Date(s) Administered   Influenza Inj Mdck Quad Pf 06/10/2017   Influenza,inj,Quad PF,6+ Mos 04/02/2019, 07/26/2022   PFIZER(Purple Top)SARS-COV-2 Vaccination 10/10/2019, 05/22/2020, 09/12/2020   Tdap 10/25/2016    Tobacco History: Social History   Tobacco Use  Smoking Status Every Day   Current packs/day: 0.25   Average packs/day: 0.3 packs/day for 4.0 years (1.0 ttl pk-yrs)   Types: Cigarettes  Smokeless Tobacco Never  Tobacco Comments   6-7 cigarettes per day   Ready to quit: Not Answered Counseling given: Not Answered Tobacco comments: 6-7 cigarettes per day   Outpatient Encounter Medications as of 04/18/2023  Medication Sig   fluconazole (DIFLUCAN) 150 MG tablet Take 1 tablet (150 mg total) by mouth daily.   oxcarbazepine (TRILEPTAL) 600 MG tablet Take 1 tablet every night    sulfamethoxazole-trimethoprim (BACTRIM DS) 800-160 MG tablet Take 1 tablet by mouth 2 (two) times daily for 3 days.   SUMAtriptan (IMITREX) 100 MG tablet Take 1 tablet at onset of migraine. Do not take more than 3 a week   topiramate (TOPAMAX) 100 MG tablet Take 3 tablets every night   ondansetron (ZOFRAN) 4 MG tablet TAKE 1 TABLET(4 MG) BY MOUTH EVERY 8 HOURS AS NEEDED FOR NAUSEA OR VOMITING (Patient not taking: Reported on 04/18/2023)   No facility-administered encounter medications on file as of 04/18/2023.    Review of Systems  Review of Systems  Constitutional: Negative.   HENT: Negative.    Cardiovascular: Negative.   Gastrointestinal: Negative.   Genitourinary:  Positive for dysuria and vaginal discharge.  Allergic/Immunologic: Negative.   Neurological: Negative.   Psychiatric/Behavioral: Negative.       Objective:   BP 129/82   Pulse 94   Ht 5' 3.25" (1.607 m)   Wt 178 lb 6.4 oz (80.9 kg)   SpO2 100%   BMI 31.35 kg/m   Wt Readings from Last 5 Encounters:  04/18/23 178 lb 6.4 oz (80.9 kg)  02/24/23 178 lb 12.8 oz (81.1 kg)  09/17/22 187 lb 9.6 oz (85.1 kg)  08/23/22 186 lb 12.8 oz (84.7 kg)  07/26/22 187 lb 9.6 oz (85.1 kg)     Physical Exam Vitals and nursing note reviewed.  Constitutional:      General: She is not in acute distress.    Appearance: She is well-developed.  Cardiovascular:     Rate and  Rhythm: Normal rate and regular rhythm.  Pulmonary:     Effort: Pulmonary effort is normal.     Breath sounds: Normal breath sounds.  Neurological:     Mental Status: She is alert and oriented to person, place, and time.       Assessment & Plan:   Dysuria -     POCT urinalysis dipstick  Vaginal irritation -     Sulfamethoxazole-Trimethoprim; Take 1 tablet by mouth 2 (two) times daily for 3 days.  Dispense: 6 tablet; Refill: 0 -     Fluconazole; Take 1 tablet (150 mg total) by mouth daily.  Dispense: 1 tablet; Refill: 0     Return if symptoms  worsen or fail to improve.   Ivonne Andrew, NP 04/18/2023

## 2023-04-18 NOTE — Patient Instructions (Signed)
1. Dysuria  - POCT urinalysis dipstick  2. Vaginal irritation  - sulfamethoxazole-trimethoprim (BACTRIM DS) 800-160 MG tablet; Take 1 tablet by mouth 2 (two) times daily for 3 days.  Dispense: 6 tablet; Refill: 0 - fluconazole (DIFLUCAN) 150 MG tablet; Take 1 tablet (150 mg total) by mouth daily.  Dispense: 1 tablet; Refill: 0  Follow up as needed    Urinary Tract Infection, Adult A urinary tract infection (UTI) is an infection of any part of the urinary tract. The urinary tract includes: The kidneys. The ureters. The bladder. The urethra. These organs make, store, and get rid of pee (urine) in the body. What are the causes? This infection is caused by germs (bacteria) in your genital area. These germs grow and cause swelling (inflammation) of your urinary tract. What increases the risk? The following factors may make you more likely to develop this condition: Using a small, thin tube (catheter) to drain pee. Not being able to control when you pee or poop (incontinence). Being female. If you are female, these things can increase the risk: Using these methods to prevent pregnancy: A medicine that kills sperm (spermicide). A device that blocks sperm (diaphragm). Having low levels of a female hormone (estrogen). Being pregnant. You are more likely to develop this condition if: You have genes that add to your risk. You are sexually active. You take antibiotic medicines. You have trouble peeing because of: A prostate that is bigger than normal, if you are female. A blockage in the part of your body that drains pee from the bladder. A kidney stone. A nerve condition that affects your bladder. Not getting enough to drink. Not peeing often enough. You have other conditions, such as: Diabetes. A weak disease-fighting system (immune system). Sickle cell disease. Gout. Injury of the spine. What are the signs or symptoms? Symptoms of this condition include: Needing to pee right  away. Peeing small amounts often. Pain or burning when peeing. Blood in the pee. Pee that smells bad or not like normal. Trouble peeing. Pee that is cloudy. Fluid coming from the vagina, if you are female. Pain in the belly or lower back. Other symptoms include: Vomiting. Not feeling hungry. Feeling mixed up (confused). This may be the first symptom in older adults. Being tired and grouchy (irritable). A fever. Watery poop (diarrhea). How is this treated? Taking antibiotic medicine. Taking other medicines. Drinking enough water. In some cases, you may need to see a specialist. Follow these instructions at home:  Medicines Take over-the-counter and prescription medicines only as told by your doctor. If you were prescribed an antibiotic medicine, take it as told by your doctor. Do not stop taking it even if you start to feel better. General instructions Make sure you: Pee until your bladder is empty. Do not hold pee for a long time. Empty your bladder after sex. Wipe from front to back after peeing or pooping if you are a female. Use each tissue one time when you wipe. Drink enough fluid to keep your pee pale yellow. Keep all follow-up visits. Contact a doctor if: You do not get better after 1-2 days. Your symptoms go away and then come back. Get help right away if: You have very bad back pain. You have very bad pain in your lower belly. You have a fever. You have chills. You feeling like you will vomit or you vomit. Summary A urinary tract infection (UTI) is an infection of any part of the urinary tract. This condition is caused  by germs in your genital area. There are many risk factors for a UTI. Treatment includes antibiotic medicines. Drink enough fluid to keep your pee pale yellow. This information is not intended to replace advice given to you by your health care provider. Make sure you discuss any questions you have with your health care provider. Document  Revised: 02/06/2020 Document Reviewed: 02/11/2020 Elsevier Patient Education  2024 [employer].

## 2023-04-18 NOTE — Telephone Encounter (Signed)
Pt called stating she needs a nurse to call her back "TODAY NOT MONDAY"

## 2023-04-20 LAB — URINE CULTURE

## 2023-07-10 ENCOUNTER — Other Ambulatory Visit: Payer: Self-pay

## 2023-07-10 MED ORDER — TOPIRAMATE 100 MG PO TABS: ORAL_TABLET | ORAL | 1 refills | Status: DC

## 2023-07-21 ENCOUNTER — Ambulatory Visit (INDEPENDENT_AMBULATORY_CARE_PROVIDER_SITE_OTHER): Payer: 59

## 2023-07-21 ENCOUNTER — Ambulatory Visit (HOSPITAL_COMMUNITY)
Admission: EM | Admit: 2023-07-21 | Discharge: 2023-07-21 | Disposition: A | Discharge status: Home / Self Care | Payer: 59 | Attending: Emergency Medicine | Admitting: Emergency Medicine

## 2023-07-21 ENCOUNTER — Encounter (HOSPITAL_COMMUNITY): Payer: Self-pay

## 2023-07-21 DIAGNOSIS — J069 Acute upper respiratory infection, unspecified: Secondary | ICD-10-CM | POA: Diagnosis not present

## 2023-07-21 DIAGNOSIS — J22 Unspecified acute lower respiratory infection: Secondary | ICD-10-CM

## 2023-07-21 MED ORDER — IPRATROPIUM BROMIDE 0.06 % NA SOLN: 2.0000 | Freq: Three times a day (TID) | NASAL | 1 refills | Status: AC

## 2023-07-21 MED ORDER — GUAIFENESIN 400 MG PO TABS: ORAL_TABLET | ORAL | 0 refills | Status: DC

## 2023-07-21 MED ORDER — LEVOCETIRIZINE DIHYDROCHLORIDE 5 MG PO TABS: 5.0000 mg | ORAL_TABLET | Freq: Every evening | ORAL | 1 refills | Status: AC

## 2023-07-21 MED ORDER — PROMETHAZINE-DM 6.25-15 MG/5ML PO SYRP: 5.0000 mL | ORAL_SOLUTION | Freq: Every evening | ORAL | 0 refills | Status: DC | PRN

## 2023-07-21 NOTE — ED Provider Notes (Signed)
 [facility]    CSN: [id number] Arrival date & time: 07/21/23  0920    HISTORY   Chief Complaint  Patient presents with   Cough   HPI Lisa Shaw is a pleasant, 33 y.o. female who presents to urgent care today. Patient complains of a 3-day history of cough intermittently productive of small amounts of clear sputum, nasal congestion, clear rhinorrhea, scratchy throat, fatigue, headache and bodyaches.  Patient states she has been taking Tylenol  and decongestants without meaningful relief.  Patient denies shortness of breath, sinus pain or pressure, nausea, vomiting, diarrhea, known sick contacts.  The history is provided by the patient.   Past Medical History:  Diagnosis Date   Acne vulgaris    Depression 04/29/2017   Epilepsy (HCC)    Migraine    Seizures (HCC) 07/18/2016   Sleep disturbance 04/29/2017   Vitamin D  deficiency 06/2020   Patient Active Problem List   Diagnosis Date Noted   Unprotected sex 02/24/2023   Atypical squamous cell changes of undetermined significance (ASCUS) on cervical cytology with negative high risk human papilloma virus (HPV) test result 09/17/2022   Tobacco abuse counseling 07/26/2022   Annual physical exam 07/26/2022   Screening examination for STI 07/26/2022   Need for influenza vaccination 07/26/2022   Cannabis dependence, uncomplicated (HCC) 07/26/2022   Localization-related idiopathic epilepsy and epileptic syndromes with seizures of localized onset, not intractable, without status epilepticus (HCC) 07/26/2022   Acne vulgaris 06/23/2019   Muscle strain 04/02/2019   Nausea 04/02/2019   History of anemia 04/02/2019   GAD (generalized anxiety disorder) 09/05/2018   Cannabis use disorder, mild, in sustained remission 09/05/2018   Major depressive disorder, recurrent episode, in full remission (HCC) 02/12/2018   Sleep disturbance 04/29/2017   History of suicide attempt 02/11/2017   Abdominal pain 10/29/2016   Seizures  (HCC) 10/25/2016   Chronic headaches 05/18/2013   History reviewed. No pertinent surgical history. OB History     Gravida  0   Para  0   Term  0   Preterm  0   AB  0   Living  0      SAB  0   IAB  0   Ectopic  0   Multiple  0   Live Births  0          Home Medications    Prior to Admission medications   Medication Sig Start Date End Date Taking? Authorizing Provider  oxcarbazepine  (TRILEPTAL ) 600 MG tablet Take 1 tablet every night 08/23/22   Georjean Darice HERO, MD  SUMAtriptan  (IMITREX ) 100 MG tablet Take 1 tablet at onset of migraine. Do not take more than 3 a week 08/23/22   Georjean Darice HERO, MD  topiramate  (TOPAMAX ) 100 MG tablet Take 3 tablets every night 07/10/23   Georjean Darice HERO, MD    Family History Family History  Problem Relation Age of Onset   Hypertension Mother    Prostate cancer Father    Pancreatic cancer Father    Seizures Other    Breast cancer Neg Hx    Colon cancer Neg Hx    Social History Social History   Tobacco Use   Smoking status: Every Day    Current packs/day: 0.25    Average packs/day: 0.3 packs/day for 4.0 years (1.0 ttl pk-yrs)    Types: Cigarettes   Smokeless tobacco: Never   Tobacco comments:    6-7 cigarettes per day  Vaping Use   Vaping status: Never  Used  Substance Use Topics   Alcohol use: No   Drug use: Yes    Types: Marijuana    Comment: trying to quit   Allergies   Patient has no known allergies.  Review of Systems Review of Systems Pertinent findings revealed after performing a 14 point review of systems has been noted in the history of present illness.  Physical Exam Vital Signs BP 112/75 (BP Location: Left Arm)   Pulse 95   Temp 98.9 F (37.2 C) (Oral)   Resp 18   LMP 07/18/2023 (Approximate)   SpO2 98%   No data found.  Physical Exam Vitals and nursing note reviewed.  Constitutional:      General: She is not in acute distress.    Appearance: Normal appearance. She is not ill-appearing.   HENT:     Head: Normocephalic and atraumatic.     Salivary Glands: Right salivary gland is not diffusely enlarged or tender. Left salivary gland is not diffusely enlarged or tender.     Right Ear: Tympanic membrane, ear canal and external ear normal. No drainage. No middle ear effusion. There is no impacted cerumen. Tympanic membrane is not erythematous or bulging.     Left Ear: Tympanic membrane, ear canal and external ear normal. No drainage.  No middle ear effusion. There is no impacted cerumen. Tympanic membrane is not erythematous or bulging.     Nose: Nose normal. No nasal deformity, septal deviation, mucosal edema, congestion or rhinorrhea.     Right Turbinates: Not enlarged, swollen or pale.     Left Turbinates: Not enlarged, swollen or pale.     Right Sinus: No maxillary sinus tenderness or frontal sinus tenderness.     Left Sinus: No maxillary sinus tenderness or frontal sinus tenderness.     Mouth/Throat:     Lips: Pink. No lesions.     Mouth: Mucous membranes are moist. No oral lesions.     Pharynx: Oropharynx is clear. Uvula midline. No posterior oropharyngeal erythema or uvula swelling.     Tonsils: No tonsillar exudate. 0 on the right. 0 on the left.  Eyes:     General: Lids are normal.        Right eye: No discharge.        Left eye: No discharge.     Extraocular Movements: Extraocular movements intact.     Conjunctiva/sclera: Conjunctivae normal.     Right eye: Right conjunctiva is not injected.     Left eye: Left conjunctiva is not injected.  Neck:     Trachea: Trachea and phonation normal.  Cardiovascular:     Rate and Rhythm: Normal rate and regular rhythm.     Pulses: Normal pulses.     Heart sounds: Normal heart sounds. No murmur heard.    No friction rub. No gallop.  Pulmonary:     Effort: Pulmonary effort is normal. No accessory muscle usage, prolonged expiration or respiratory distress.     Breath sounds: No stridor, decreased air movement or transmitted  upper airway sounds. Examination of the left-middle field reveals rales. Examination of the left-lower field reveals rales. Rales present. No decreased breath sounds, wheezing or rhonchi.  Chest:     Chest wall: No tenderness.  Musculoskeletal:        General: Normal range of motion.     Cervical back: Normal range of motion and neck supple. Normal range of motion.  Lymphadenopathy:     Cervical: No cervical adenopathy.  Skin:  General: Skin is warm and dry.     Findings: No erythema or rash.  Neurological:     General: No focal deficit present.     Mental Status: She is alert and oriented to person, place, and time.  Psychiatric:        Mood and Affect: Mood normal.        Behavior: Behavior normal.     Visual Acuity Right Eye Distance:   Left Eye Distance:   Bilateral Distance:    Right Eye Near:   Left Eye Near:    Bilateral Near:     UC Couse / Diagnostics / Procedures:     Radiology No results found.  Procedures Procedures (including critical care time) EKG  Pending results:  Labs Reviewed - No data to display  Medications Ordered in UC: Medications - No data to display  UC Diagnoses / Final Clinical Impressions(s)   I have reviewed the triage vital signs and the nursing notes.  Pertinent labs & imaging results that were available during my care of the patient were reviewed by me and considered in my medical decision making (see chart for details).    Final diagnoses:  Lower respiratory tract infection  Viral URI with cough  Viral testing not indicated due to duration of symptoms. Per dependent read, chest x-ray not concerning for acute cardiopulmonary disease.  Patient advised likely viral URI with cough, patient provided with antihistamine, Atrovent  nasal spray, Mucinex  and Promethazine  DM for relief of symptoms.  Conservative care recommended.  Return precautions advised.  Please see discharge instructions below for details of plan of care as provided  to patient. ED Prescriptions     Medication Sig Dispense Auth. Provider   levocetirizine (XYZAL ) 5 MG tablet Take 1 tablet (5 mg total) by mouth every evening. 90 tablet Joesph Shaver Scales, PA-C   ipratropium (ATROVENT ) 0.06 % nasal spray Place 2 sprays into both nostrils 3 (three) times daily. As needed for nasal congestion, runny nose 15 mL Joesph Shaver Scales, PA-C   guaifenesin  (HUMIBID E) 400 MG TABS tablet Take 1 tablet 3 times daily as needed for chest congestion and cough 30 tablet Joesph Shaver Scales, PA-C   promethazine -dextromethorphan (PROMETHAZINE -DM) 6.25-15 MG/5ML syrup Take 5 mLs by mouth at bedtime as needed for cough. 60 mL Joesph Shaver Scales, PA-C      PDMP not reviewed this encounter.  Pending results:  Labs Reviewed - No data to display    Discharge Instructions      Your chest x-ray is not concerning for pneumonia or bronchitis at this time.  I believe that you are suffering from a viral upper respiratory infection with cough.  Please read below to learn more about the medications, dosages and frequencies that I recommend to help alleviate your symptoms and to get you feeling better soon:   Xyzal  (levocetirizine): This is an excellent second-generation antihistamine that helps to reduce respiratory inflammatory response to environmental allergens.  In some patients, this medication can cause daytime sleepiness so I recommend that you take 1 tablet daily at bedtime.     Atrovent  (ipratropium): This is an excellent nasal decongestant spray that does not cause rebound congestion.  Please instill 2 sprays into each nare with each use, you may use this up to 3 times daily.  Once you find that you are forgetting to use the spray more often that you remember to use it, you will know that you no longer need it.   Robitussin, Mucinex  (guaifenesin ):  This is a daytime expectorant.  This single symptom reliever helps break up chest congestion and loosen up thick  nasal drainage making phlegm and drainage easier to cough up and to blow out from your nose.  I recommend taking 400 mg in either liquid or tablet form three times daily as needed.  I do not recommend the 12-hour extended relief version or doses higher than 400 mg per each dose as these often make some patients feel jittery or jumpy and can interfere with sleep.  I also do not recommend that you purchase guaifenesin  with the ingredient  DM which is dextromethorphan, a cough suppressant which I only recommend taking at bedtime.  Guaifenesin  400 mg is a safe dose for people who are being treated for high blood pressure.     Promethazine  DM: Promethazine  is both a nasal decongestant that dries up mucous membranes and an antinausea medication.  Promethazine  often makes most patients feel fairly sleepy.  DM is dextromethorphan, a single symptom reliever which is a cough suppressant found in many over-the-counter cough medications and combination cold preparations.  Please take 5 mL before bedtime to minimize your cough which will help you sleep better.  I have sent a prescription for this medication to your pharmacy because it cannot be purchased over-the-counter.   If symptoms have not meaningfully improved in the next 5 to 7 days, please return for repeat evaluation or follow-up with your regular provider.  If symptoms have worsened in the next 3 to 5 days, please return for repeat evaluation or follow-up with your regular provider.    Thank you for visiting urgent care today.  We appreciate the opportunity to participate in your care.       Disposition Upon Discharge:  Condition: stable for discharge home  Patient presented with an acute illness with associated systemic symptoms and significant discomfort requiring urgent management. In my opinion, this is a condition that a prudent lay person (someone who possesses an average knowledge of health and medicine) may potentially expect to result in  complications if not addressed urgently such as respiratory distress, impairment of bodily function or dysfunction of bodily organs.   Routine symptom specific, illness specific and/or disease specific instructions were discussed with the patient and/or caregiver at length.   As such, the patient has been evaluated and assessed, work-up was performed and treatment was provided in alignment with urgent care protocols and evidence based medicine.  Patient/parent/caregiver has been advised that the patient may require follow up for further testing and treatment if the symptoms continue in spite of treatment, as clinically indicated and appropriate.  Patient/parent/caregiver has been advised to return to the [facility] or PCP if no better; to PCP or the Emergency Department if new signs and symptoms develop, or if the current signs or symptoms continue to change or worsen for further workup, evaluation and treatment as clinically indicated and appropriate  The patient will follow up with their current PCP if and as advised. If the patient does not currently have a PCP we will assist them in obtaining one.   The patient may need specialty follow up if the symptoms continue, in spite of conservative treatment and management, for further workup, evaluation, consultation and treatment as clinically indicated and appropriate.  Patient/parent/caregiver verbalized understanding and agreement of plan as discussed.  All questions were addressed during visit.  Please see discharge instructions below for further details of plan.  This office note has been dictated using Teaching laboratory technician.  Unfortunately, this method of dictation can sometimes lead to typographical or grammatical errors.  I apologize for your inconvenience in advance if this occurs.  Please do not hesitate to reach out to me if clarification is needed.      Joesph Shaver Scales, PA-C 07/21/23 1158

## 2023-07-21 NOTE — ED Triage Notes (Signed)
 Pt c/o cough, congestion, fatigue, headache, and body aches x3 days. States taking tylenol and congestion meds with no relief.

## 2023-07-21 NOTE — Discharge Instructions (Signed)
 Your chest x-ray is not concerning for pneumonia or bronchitis at this time.  I believe that you are suffering from a viral upper respiratory infection with cough.  Please read below to learn more about the medications, dosages and frequencies that I recommend to help alleviate your symptoms and to get you feeling better soon:   Xyzal  (levocetirizine): This is an excellent second-generation antihistamine that helps to reduce respiratory inflammatory response to environmental allergens.  In some patients, this medication can cause daytime sleepiness so I recommend that you take 1 tablet daily at bedtime.     Atrovent  (ipratropium): This is an excellent nasal decongestant spray that does not cause rebound congestion.  Please instill 2 sprays into each nare with each use, you may use this up to 3 times daily.  Once you find that you are forgetting to use the spray more often that you remember to use it, you will know that you no longer need it.   Robitussin, Mucinex  (guaifenesin ): This is a daytime expectorant.  This single symptom reliever helps break up chest congestion and loosen up thick nasal drainage making phlegm and drainage easier to cough up and to blow out from your nose.  I recommend taking 400 mg in either liquid or tablet form three times daily as needed.  I do not recommend the 12-hour extended relief version or doses higher than 400 mg per each dose as these often make some patients feel jittery or jumpy and can interfere with sleep.  I also do not recommend that you purchase guaifenesin  with the ingredient  DM which is dextromethorphan, a cough suppressant which I only recommend taking at bedtime.  Guaifenesin  400 mg is a safe dose for people who are being treated for high blood pressure.     Promethazine  DM: Promethazine  is both a nasal decongestant that dries up mucous membranes and an antinausea medication.  Promethazine  often makes most patients feel fairly sleepy.  DM is  dextromethorphan, a single symptom reliever which is a cough suppressant found in many over-the-counter cough medications and combination cold preparations.  Please take 5 mL before bedtime to minimize your cough which will help you sleep better.  I have sent a prescription for this medication to your pharmacy because it cannot be purchased over-the-counter.   If symptoms have not meaningfully improved in the next 5 to 7 days, please return for repeat evaluation or follow-up with your regular provider.  If symptoms have worsened in the next 3 to 5 days, please return for repeat evaluation or follow-up with your regular provider.    Thank you for visiting urgent care today.  We appreciate the opportunity to participate in your care.

## 2023-07-24 ENCOUNTER — Ambulatory Visit (INDEPENDENT_AMBULATORY_CARE_PROVIDER_SITE_OTHER): Payer: 59

## 2023-07-24 VITALS — Ht 63.25 in | Wt 178.0 lb

## 2023-07-24 DIAGNOSIS — Z0000 Encounter for general adult medical examination without abnormal findings: Secondary | ICD-10-CM

## 2023-07-24 DIAGNOSIS — Z0100 Encounter for examination of eyes and vision without abnormal findings: Secondary | ICD-10-CM

## 2023-07-24 NOTE — Progress Notes (Signed)
 Please attest and cosign this visit due to patients primary care provider not being in the office at the time the visit was completed.   Because this visit was a virtual/telehealth visit,  certain criteria was not obtained, such a blood pressure, CBG if applicable, and timed get up and go. Any medications not marked as taking were not mentioned during the medication reconciliation part of the visit. Any vitals not documented were not able to be obtained due to this being a telehealth visit or patient was unable to self-report a recent blood pressure reading due to a lack of equipment at home via telehealth. Vitals that have been documented are verbally provided by the patient.   Subjective:   Lisa Shaw is a 33 y.o. female who presents for Medicare Annual (Subsequent) preventive examination.  Visit Complete: Virtual I connected with  Lisa Shaw on 07/24/23 by a video and audio enabled telemedicine application and verified that I am speaking with the correct person using two identifiers.  Patient Location: Home  Provider Location: Home Office  I discussed the limitations of evaluation and management by telemedicine. The patient expressed understanding and agreed to proceed.  Vital Signs: Because this visit was a virtual/telehealth visit, some criteria may be missing or patient reported. Any vitals not documented were not able to be obtained and vitals that have been documented are patient reported.  Patient Medicare AWV questionnaire was completed by the patient on 07/24/2023; I have confirmed that all information answered by patient is correct and no changes since this date.        Objective:    Today's Vitals   07/24/23 1113 07/24/23 1116  Weight: 178 lb (80.7 kg)   Height: 5' 3.25 (1.607 m)   PainSc:  0-No pain   Body mass index is 31.28 kg/m.     07/24/2023   11:11 AM 08/23/2022    9:53 AM 07/18/2022    2:47 PM 08/22/2021    1:55 PM 03/28/2021    5:14 PM  01/11/2021   10:07 AM 08/14/2020   11:54 AM  Advanced Directives  Does Patient Have a Medical Advance Directive? No No No No No No No  Would patient like information on creating a medical advance directive? No - Patient declined  No - Patient declined  Yes (ED - Information included in AVS)      Current Medications (verified) Outpatient Encounter Medications as of 07/24/2023  Medication Sig   guaifenesin  (HUMIBID E) 400 MG TABS tablet Take 1 tablet 3 times daily as needed for chest congestion and cough   ipratropium (ATROVENT ) 0.06 % nasal spray Place 2 sprays into both nostrils 3 (three) times daily. As needed for nasal congestion, runny nose   levocetirizine (XYZAL ) 5 MG tablet Take 1 tablet (5 mg total) by mouth every evening.   oxcarbazepine  (TRILEPTAL ) 600 MG tablet Take 1 tablet every night   promethazine -dextromethorphan (PROMETHAZINE -DM) 6.25-15 MG/5ML syrup Take 5 mLs by mouth at bedtime as needed for cough.   SUMAtriptan  (IMITREX ) 100 MG tablet Take 1 tablet at onset of migraine. Do not take more than 3 a week   topiramate  (TOPAMAX ) 100 MG tablet Take 3 tablets every night   No facility-administered encounter medications on file as of 07/24/2023.    Allergies (verified) Patient has no known allergies.   History: Past Medical History:  Diagnosis Date   Acne vulgaris    Depression 04/29/2017   Epilepsy (HCC)    Migraine    Seizures (HCC)  07/18/2016   Sleep disturbance 04/29/2017   Vitamin D  deficiency 06/2020   History reviewed. No pertinent surgical history. 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 [place]  Tobacco Use   Smoking status: Every Day    Current packs/day: 0.25    Average packs/day: 0.3 packs/day for 4.0 years (1.0 ttl pk-yrs)    Types: Cigarettes   Smokeless tobacco: Never   Tobacco comments:    6-7 cigarettes per day  Vaping Use   Vaping status: Never Used  Substance and Sexual Activity   Alcohol use: No   Drug use: Yes    Types: Marijuana    Comment: trying to quit   Sexual activity: Yes    Birth control/protection: Condom  Other Topics Concern   Not on file  Social History Narrative   Patient lives home alone    Right handed    07/24/2023: 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 and will graduate 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   Social Drivers of Health   Financial Resource Strain: Low Risk  (07/24/2023)   Overall Financial Resource Strain (CARDIA)    Difficulty of Paying Living Expenses: Not very hard  Food Insecurity: No Food Insecurity (07/24/2023)   Hunger Vital Sign    Worried About Running Out of Food in the Last Year: Never true    Ran Out of Food in the Last Year: Never true  Transportation Needs: No Transportation Needs (07/24/2023)   PRAPARE - Administrator, Civil Service (Medical): No    Lack of Transportation (Non-Medical): No  Physical Activity: Insufficiently Active (07/24/2023)   Exercise Vital Sign    Days of Exercise per Week: 7 days    Minutes of Exercise per Session: 20 min  Stress: No Stress Concern Present (07/24/2023)   [employer] of Occupational Health - Occupational Stress Questionnaire    Feeling of Stress : Not at all  Social Connections: Moderately Isolated (07/24/2023)   Social Connection and Isolation Panel [NHANES]    Frequency of Communication with Friends and Family: More than three times a week    Frequency of Social Gatherings with Friends and Family: Twice a week    Attends Religious Services: 1 to 4 times per year    Active Member of [employer] or Organizations: No    Attends Hospital Doctor: Not on file    Marital Status: Never married    Tobacco Counseling Ready to quit: Not Answered Counseling given: Not Answered Tobacco comments: 6-7 cigarettes per day   Clinical Intake:  Pre-visit preparation completed: Yes  Pain : No/denies pain Pain Score: 0-No pain     BMI - recorded: 31.28 Nutritional Risks: None Diabetes: No  How often do you need to have someone help you when you read instructions, pamphlets, or other written materials from your doctor or pharmacy?: 1 - Never  Interpreter Needed?: No  Information entered by :: Morna Flud, CMA   Activities of Daily Living    07/24/2023  11:06 AM  In your present state of health, do you have any difficulty performing the following activities:  Hearing? 0  Vision? 0  Difficulty concentrating or making decisions? 0  Walking or climbing stairs? 0  Dressing or bathing? 0  Doing errands, shopping? 0  Preparing Food and eating ? N  Using the Toilet? N  In the past six months, have you accidently leaked urine? N  Do you have problems with loss of bowel control? N  Managing your Medications? N  Managing your Finances? N  Housekeeping or managing your Housekeeping? N    Patient Care Team: Paseda, Folashade R, FNP as PCP - General (Nurse Practitioner) Georjean Darice HERO, MD as Consulting Physician (Neurology)  Indicate any recent Medical Services you may have received from other than [facility] providers in the past year (date may be approximate).     Assessment:   This is a routine wellness examination for Kiele.  Hearing/Vision screen Hearing Screening - Comments:: Patient denies any hearing difficulties.   Vision Screening - Comments:: Referral placed today for patient.     Goals Addressed             This Visit's Progress    Patient Stated       Graduate and start pursuing my new career        Depression Screen    02/24/2023    3:18 PM 07/26/2022    1:12 PM 07/18/2022    2:46 PM  12/28/2021    9:48 AM 03/26/2021   12:55 PM 03/05/2021    4:00 PM 12/28/2019    9:35 AM  PHQ 2/9 Scores  PHQ - 2 Score 0 0 0 0 0 0 1  PHQ- 9 Score 0   0 0 0 1    Fall Risk    07/24/2023   11:06 AM 08/23/2022    9:53 AM 07/18/2022    2:44 PM 07/18/2022   10:05 AM 12/28/2021    9:46 AM  Fall Risk   Falls in the past year? 0 0 0 0 0  Number falls in past yr: 0 0 0 0 0  Injury with Fall? 0 0 0 0 0  Risk for fall due to :   No Fall Risks  No Fall Risks  Follow up  Falls evaluation completed Falls prevention discussed  Falls evaluation completed    MEDICARE RISK AT HOME: Medicare Risk at Home Any stairs in or around the home?: (Patient-Rptd) No If so, are there any without handrails?: (Patient-Rptd) No Home free of loose throw rugs in walkways, pet beds, electrical cords, etc?: (Patient-Rptd) No Adequate lighting in your home to reduce risk of falls?: (Patient-Rptd) No Life alert?: (Patient-Rptd) No Use of a cane, walker or w/c?: (Patient-Rptd) No Grab bars in the bathroom?: (Patient-Rptd) Yes Shower chair or bench in shower?: (Patient-Rptd) No Elevated toilet seat or a handicapped toilet?: (Patient-Rptd) No  TIMED UP AND GO:  Was the test performed?  No    Cognitive Function:        07/24/2023   11:16 AM 07/18/2022    2:49 PM 03/28/2021    5:17 PM  6CIT Screen  What Year? 0 points 0 points 0 points  What month? 0 points 0 points 0 points  What time? 0 points 0 points 0 points  Count back from 20 0 points 0 points 0 points  Months in reverse 0 points 4 points 0 points  Repeat phrase 0 points 4 points 0 points  Total Score 0 points 8 points 0 points    Immunizations Immunization History  Administered Date(s) Administered   Influenza Inj Mdck Quad Pf 06/10/2017   Influenza,inj,Quad PF,6+ Mos 04/02/2019, 07/26/2022   PFIZER(Purple Top)SARS-COV-2 Vaccination 10/10/2019, 05/22/2020, 09/12/2020   Tdap 10/25/2016    TDAP status: Up to date  Flu Vaccine status: Due, Education  has been provided regarding the importance of this vaccine. Advised may receive this vaccine at local pharmacy or Health Dept. Aware to provide a copy of the vaccination record if obtained from local pharmacy or Health Dept. Verbalized acceptance and understanding.  Pneumococcal vaccine status: Not age appropriate for this patient.   Covid-19 vaccine status: Information provided on how to obtain vaccines.   Qualifies for Shingles Vaccine? No   Not age appropriate for this patient Screening Tests Health Maintenance  Topic Date Due   INFLUENZA VACCINE  02/13/2023   COVID-19 Vaccine (4 - 2024-25 season) 03/16/2023   Medicare Annual Wellness (AWV)  07/19/2023   Cervical Cancer Screening (Pap smear)  07/27/2023   DTaP/Tdap/Td (2 - Td or Tdap) 10/26/2026   Hepatitis C Screening  Completed   HIV Screening  Completed   HPV VACCINES  Aged Out    Health Maintenance  Health Maintenance Due  Topic Date Due   INFLUENZA VACCINE  02/13/2023   COVID-19 Vaccine (4 - 2024-25 season) 03/16/2023   Medicare Annual Wellness (AWV)  07/19/2023   Cervical Cancer Screening (Pap smear)  07/27/2023    Colorectal Cancer Screenings: Not age appropriate for this patient.    Mammogram: Not age appropriate for this patient   Bone Density Screening: Not age appropriate for this patient.   Lung Cancer Screening: (Low Dose CT Chest recommended if Age 25-80 years, 20 pack-year currently smoking OR have quit w/in 15years.) does not qualify.   Lung Cancer Screening Referral: na  Additional Screening:  Hepatitis C Screening: does not qualify; Completed   Vision Screening: Recommended annual ophthalmology exams for early detection of glaucoma and other disorders of the eye. Is the patient up to date with their annual eye exam?  No  Who is the provider or what is the name of the office in which the patient attends annual eye exams? Referral placed 07/24/2023 per patient request.   Dental Screening: Recommended  annual dental exams for proper oral hygiene  Diabetic Foot Exam: na  Community Resource Referral / Chronic Care Management: CRR required this visit?  No   CCM required this visit?  No     Plan:     I have personally reviewed and noted the following in the patient's chart:   Medical and social history Use of alcohol, tobacco or illicit drugs  Current medications and supplements including opioid prescriptions. Patient is not currently taking opioid prescriptions. Functional ability and status Nutritional status Physical activity Advanced directives List of other physicians Hospitalizations, surgeries, and ER visits in previous 12 months Vitals Screenings to include cognitive, depression, and falls Referrals and appointments  In addition, I have reviewed and discussed with patient certain preventive protocols, quality metrics, and best practice recommendations. A written personalized care plan for preventive services as well as general preventive health recommendations were provided to patient.     [facility] LABOR Ashleyanne Hemmingway, CMA   07/24/2023   After Visit Summary: (MyChart) Due to this being a telephonic visit, the after visit summary with patients personalized plan was offered to patient via MyChart

## 2023-07-24 NOTE — Patient Instructions (Signed)
 Ms. Lisa Shaw , Thank you for taking time to come for your Medicare Wellness Visit. I appreciate your ongoing commitment to your health goals. Please review the following plan we discussed and let me know if I can assist you in the future.   Referrals/Orders/Follow-Ups/Clinician Recommendations:  Jerrie been referred to see Dr. Adine Haddock for an eye exam. If you haven't heard from his office in about a week, please call to schedule your appointment.   Adine Haddock, MD [address] [place] [address]  Phone: [phone]  Next Medicare Annual Wellness Visit: July 29, 2024 at 8:40 am virtual appointment  This is a list of the screening recommended for you and due dates:  Health Maintenance  Topic Date Due   Flu Shot  02/13/2023   COVID-19 Vaccine (4 - 2024-25 season) 03/16/2023   Pap Smear  07/27/2023   Medicare Annual Wellness Visit  07/23/2024   DTaP/Tdap/Td vaccine (2 - Td or Tdap) 10/26/2026   Hepatitis C Screening  Completed   HIV Screening  Completed   HPV Vaccine  Aged Out    Advanced directives: (Declined) Advance directive discussed with you today. Even though you declined this today, please call our office should you change your mind, and we can give you the proper paperwork for you to fill out.  Next Medicare Annual Wellness Visit scheduled for next year: Yes Preventive Care 32-34 Years Old, Female Preventive care refers to lifestyle choices and visits with your health care provider that can promote health and wellness. Preventive care visits are also called wellness exams. What can I expect for my preventive care visit? Counseling During your preventive care visit, your health care provider may ask about your: Medical history, including: Past medical problems. Family medical history. Pregnancy history. Current health, including: Menstrual cycle. Method of birth control. Emotional well-being. Home life and relationship well-being. Sexual activity and  sexual health. Lifestyle, including: Alcohol, nicotine  or tobacco, and drug use. Access to firearms. Diet, exercise, and sleep habits. Work and work astronomer. Sunscreen use. Safety issues such as seatbelt and bike helmet use. Physical exam Your health care provider may check your: Height and weight. These may be used to calculate your BMI (body mass index). BMI is a measurement that tells if you are at a healthy weight. Waist circumference. This measures the distance around your waistline. This measurement also tells if you are at a healthy weight and may help predict your risk of certain diseases, such as type 2 diabetes and high blood pressure. Heart rate and blood pressure. Body temperature. Skin for abnormal spots. What immunizations do I need?  Vaccines are usually given at various ages, according to a schedule. Your health care provider will recommend vaccines for you based on your age, medical history, and lifestyle or other factors, such as travel or where you work. What tests do I need? Screening Your health care provider may recommend screening tests for certain conditions. This may include: Pelvic exam and Pap test. Lipid and cholesterol levels. Diabetes screening. This is done by checking your blood sugar (glucose) after you have not eaten for a while (fasting). Hepatitis B test. Hepatitis C test. HIV (human immunodeficiency virus) test. STI (sexually transmitted infection) testing, if you are at risk. BRCA-related cancer screening. This may be done if you have a family history of breast, ovarian, tubal, or peritoneal cancers. Talk with your health care provider about your test results, treatment options, and if necessary, the need for more tests. Follow these  instructions at home: Eating and drinking  Eat a healthy diet that includes fresh fruits and vegetables, whole grains, lean protein, and low-fat dairy products. Take vitamin and mineral supplements as  recommended by your health care provider. Do not drink alcohol if: Your health care provider tells you not to drink. You are pregnant, may be pregnant, or are planning to become pregnant. If you drink alcohol: Limit how much you have to 0-1 drink a day. Know how much alcohol is in your drink. In the [place] one drink equals one 12 oz bottle of beer (355 mL), one 5 oz glass of wine (148 mL), or one 1 oz glass of hard liquor (44 mL). Lifestyle Brush your teeth every morning and night with fluoride toothpaste. Floss one time each day. Exercise for at least 30 minutes 5 or more days each week. Do not use any products that contain nicotine  or tobacco. These products include cigarettes, chewing tobacco, and vaping devices, such as e-cigarettes. If you need help quitting, ask your health care provider. Do not use drugs. If you are sexually active, practice safe sex. Use a condom or other form of protection to prevent STIs. If you do not wish to become pregnant, use a form of birth control. If you plan to become pregnant, see your health care provider for a prepregnancy visit. Find healthy ways to manage stress, such as: Meditation, yoga, or listening to music. Journaling. Talking to a trusted person. Spending time with friends and family. Minimize exposure to UV radiation to reduce your risk of skin cancer. Safety Always wear your seat belt while driving or riding in a vehicle. Do not drive: If you have been drinking alcohol. Do not ride with someone who has been drinking. If you have been using any mind-altering substances or drugs. While texting. When you are tired or distracted. Wear a helmet and other protective equipment during sports activities. If you have firearms in your house, make sure you follow all gun safety procedures. Seek help if you have been physically or sexually abused. What's next? Go to your health care provider once a year for an annual wellness visit. Ask your health  care provider how often you should have your eyes and teeth checked. Stay up to date on all vaccines. This information is not intended to replace advice given to you by your health care provider. Make sure you discuss any questions you have with your health care provider. Document Revised: 12/27/2020 Document Reviewed: 12/27/2020 Elsevier Patient Education  2024 [employer].

## 2023-08-11 ENCOUNTER — Encounter: Payer: Self-pay | Admitting: Neurology

## 2023-08-11 ENCOUNTER — Ambulatory Visit (INDEPENDENT_AMBULATORY_CARE_PROVIDER_SITE_OTHER): Payer: 59 | Admitting: Neurology

## 2023-08-11 VITALS — BP 100/62 | Ht 64.0 in | Wt 178.0 lb

## 2023-08-11 DIAGNOSIS — F32A Depression, unspecified: Secondary | ICD-10-CM | POA: Diagnosis not present

## 2023-08-11 DIAGNOSIS — G40009 Localization-related (focal) (partial) idiopathic epilepsy and epileptic syndromes with seizures of localized onset, not intractable, without status epilepticus: Secondary | ICD-10-CM

## 2023-08-11 DIAGNOSIS — G43009 Migraine without aura, not intractable, without status migrainosus: Secondary | ICD-10-CM | POA: Diagnosis not present

## 2023-08-11 MED ORDER — TOPIRAMATE 100 MG PO TABS: ORAL_TABLET | ORAL | 11 refills | Status: DC

## 2023-08-11 MED ORDER — RIZATRIPTAN BENZOATE 10 MG PO TBDP: ORAL_TABLET | ORAL | 11 refills | Status: DC

## 2023-08-11 MED ORDER — NORTRIPTYLINE HCL 10 MG PO CAPS: 10.0000 mg | ORAL_CAPSULE | Freq: Every day | ORAL | 11 refills | Status: DC

## 2023-08-11 MED ORDER — OXCARBAZEPINE 600 MG PO TABS: ORAL_TABLET | ORAL | 11 refills | Status: DC

## 2023-08-11 NOTE — Progress Notes (Signed)
NEUROLOGY FOLLOW UP OFFICE NOTE  NYISHA Shaw [id number] [birth year]  HISTORY OF PRESENT ILLNESS: I had the pleasure of seeing [name] in follow-up in the neurology clinic on 08/11/2023. She is alone in the office today. The patient was last seen 33 years old for bilateral tonic-clonic epilepsy and migraines. Prior EEG reported left frontal sharp waves. MRI brain motion-degraded but no acute changes. Since her last visit, she denies any seizures or seizure-like symptoms since June 2020 on Oxcarbazepine 600mg  at bedtime and Topiramate 300mg  at bedtime, no side effects. She denies any staring/unresponsive episodes, gaps in time, olfactory/gustatory hallucinations, focal numbness/tingling/weakness, myoclonic jerks. She stays mostly by herself, she lives with her mother who works but has not mentioned any concerns when they are together. She has been having more migraines, she was at Urgent Care in November and received a migraine cocktail. She is not sure the sumatriptan was helping and has no refills. Since November, she has had migraines off and on, she had a bad on last Thursday that lasted until Saturday, possibly stress-induced. She is not sleeping well at night, she tosses and turns and also thinks it may be due to stress. She became tearful in the office. She gets drowsy after her night medications, then has interrupted sleep. This has been ongoing the past 4-6 months. The migraines are always on the left temple, sometimes she feels her pulse when she pushes on her temple. No nausea/vomiting, visual obscurations, but sometimes she sees double. Mood is iffy. She does not drive. No pregnancy plans.   History on Initial Assessment 12/02/2017: This is a pleasant 33 year old right-handed woman with a history of seizures since 2013. Records from [facility] and [facility] were reviewed and will be summarized here, she is depressed in the office today and is a poor historian. She initially had nocturnal  seizures with described diffuse rhythmic jerking with eye rolling backwards. She had an EEG in 06/2012 reporting left frontal sharps. There is also note of episodes in 2012 where she would have staring spells lasting around 2 minutes with associated diaphoresis and post-ictal confusion with violence usually at night or early in the morning. Both seizure types were associated with tongue bite and urinary incontinence. There is report of deja vu, and she reports tingling in both hands during the "stare offs." She lives with her mother but is mostly by herself at home during the day, and feels she has the "stare off" episodes around twice a week. She was initially started on Keppra which caused mood issues, then switched to Topamax. It appears she initially had mental slowing on Topamax, but has been taking 300mg  qhs since 2015. She had seen Dr. Marjory Lies at [facility] last year and was started on Vimpat which she stopped again due to side effects. She reports the last nocturnal convulsion was in June 2018 when she woke up feeling weird with her tongue sore and with a headache. She was given a headache cocktail but was also noted to be very anxious and depressed. She was admitted for involuntary commitment at [facility] in July 2018 for suicidal ideation. She currently denies any suicidal ideation but continues to report significant depression. She reports body twitches in her arms or legs. She has headaches on a daily basis, taking an average of 2 Tylenol or BC powder daily. She feels it is due to "overthinking or depression." She has sleep difficulties with interrupted 5-6 hours of sleep at night. She feels fatigued and drowsy  during the day. She has dizziness when standing. Vision is occasionally blurred when watching TV. She has noticed swallowing difficulties. She notices her right foot gets numb frequently and her balance is off. No falls. 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    Epilepsy Risk Factors:  Her maternal great grandmother and great aunt had seizures. Otherwise she had a normal birth and early development.  There is no history of febrile convulsions, CNS infections such as meningitis/encephalitis, significant traumatic brain injury, neurosurgical procedures.   Prior AEDs: Keppra, Vimpat Laboratory Data:  EEGs: EEG in 06/2012 reported as abnormal secondary to focal sharp wave activity in the left frontal region MRI: I personally reviewed MRI brain without contrast done 06/2012 which was degraded by motion. Lateral ventricles are mildly prominent in size with a slightly rounded appearance, no transependymal CSF flow. Hippocampi appear symmetric with no abnormal signal seen.     PAST MEDICAL HISTORY: Past Medical History:  Diagnosis Date   Acne vulgaris    Depression 04/29/2017   Epilepsy (HCC)    Migraine    Seizures (HCC) 07/18/2016   Sleep disturbance 04/29/2017   Vitamin D deficiency 06/2020    MEDICATIONS: Current Outpatient Medications on File Prior to Visit  Medication Sig Dispense Refill   ipratropium (ATROVENT) 0.06 % nasal spray Place 2 sprays into both nostrils 3 (three) times daily. As needed for nasal congestion, runny nose 15 mL 1   levocetirizine (XYZAL) 5 MG tablet Take 1 tablet (5 mg total) by mouth every evening. 90 tablet 1   oxcarbazepine (TRILEPTAL) 600 MG tablet Take 1 tablet every night 30 tablet 11   SUMAtriptan (IMITREX) 100 MG tablet Take 1 tablet at onset of migraine. Do not take more than 3 a week 10 tablet 11   topiramate (TOPAMAX) 100 MG tablet Take 3 tablets every night 90 tablet 1   No current facility-administered medications on file prior to visit.    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 [place]  Tobacco Use   Smoking status: Every Day    Current packs/day: 0.25    Average packs/day: 0.3 packs/day for 4.0 years (1.0 ttl pk-yrs)    Types: Cigarettes   Smokeless tobacco: Never   Tobacco comments:    6-7 cigarettes per day  Vaping Use   Vaping status: Never Used  Substance and Sexual Activity   Alcohol use: No   Drug use: Yes    Types: Marijuana    Comment: trying to quit   Sexual activity: Yes    Birth control/protection: Condom  Other Topics Concern   Not on file  Social History Narrative   Patient lives home alone    Right handed    07/24/2023: 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 and will graduate in 2026   Social Drivers of Health   Financial Resource Strain:  Low Risk  (07/24/2023)   Overall Financial Resource Strain (CARDIA)    Difficulty of Paying Living Expenses: Not very hard  Food Insecurity: No Food Insecurity (07/24/2023)   Hunger Vital Sign    Worried About Running Out of Food in the Last Year: Never true    Ran Out of Food in the Last Year: Never true  Transportation Needs: No Transportation Needs (07/24/2023)   PRAPARE - Administrator, Civil Service (Medical): No    Lack of Transportation (Non-Medical): No  Physical Activity: Insufficiently Active (07/24/2023)   Exercise Vital Sign    Days of Exercise per Week: 7 days    Minutes of Exercise per Session: 20 min  Stress: No Stress Concern Present (07/24/2023)   [employer] of Occupational Health - Occupational Stress Questionnaire    Feeling of Stress : Not at all  Social Connections: Moderately Isolated (07/24/2023)   Social Connection and Isolation Panel [NHANES]    Frequency of Communication with  Friends and Family: More than three times a week    Frequency of Social Gatherings with Friends and Family: Twice a week    Attends Religious Services: 1 to 4 times per year    Active Member of [employer] or Organizations: No    Attends Banker Meetings: Never    Marital Status: Never married  Intimate Partner Violence: Not At Risk (07/24/2023)   Humiliation, Afraid, Rape, and Kick questionnaire    Fear of Current or Ex-Partner: No    Emotionally Abused: No    Physically Abused: No    Sexually Abused: No     PHYSICAL EXAM: Vitals:   08/11/23 0944  BP: 100/62   General: No acute distress Head:  Normocephalic/atraumatic Skin/Extremities: No rash, no edema Neurological Exam: alert and awake. No aphasia or dysarthria. Fund of knowledge is appropriate. Attention and concentration are normal.   Cranial nerves: Pupils equal, round. Extraocular movements intact with no nystagmus. Visual fields full.  No facial asymmetry.  Motor: Bulk and tone normal, muscle strength 5/5 throughout with no pronator drift.   Finger to nose testing intact.  Gait narrow-based and steady, able to tandem walk adequately.  Romberg negative.   IMPRESSION: This is a pleasant 33 yo RH woman with a history of depression, migraines, and seizures. 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 Prior EEG in 2013 reported left frontal sharp waves, MRI degraded by motion but no acute changes seen. She has been seizure-free since June 2020 on Oxcarbazepine 600mg  at bedtime and Topiramate 150mg  at bedtime. She has been having more migraines,a s well as sleep difficulties. She also endorses depression and increased stress, which is likely contributing to these symptoms. We discussed starting low dose nortriptyline 10mg  at bedtime, side effects discussed. She was advised to keep a migraine calendar and update in 2 months, we may uptitrate dose as tolerated. She  will try prn rizatriptan for migraine rescue. She will also be referred to [facility] for psychotherapy. She does not drive. Follow-up in 4-5 months, call for any changes.   Thank you for allowing me to participate in her care.  Please do not hesitate to call for any questions or concerns.   Patrcia Dolly, M.D.   CC: Edwin Dada, FNP

## 2023-08-11 NOTE — Patient Instructions (Signed)
Good to see you.  Start Nortriptyline 10mg : take 1 capsule every night. Keep a calendar of your headaches and update me in 2 months. We may increase dose if needed.  2. Continue Topiramate 100mg : take 3 tablets every night and Oxcarbazepine 600mg  every night  3. A different rescue medication: Rizatriptan (Maxalt) was sent to your pharmacy. Take at onset of migraine. May take another dose in 2 hours if needed. Do not take more than 3 a week  4. Referral will be sent for a counselor/therapist for depression  5. Follow-up in 4-5 months,call for any changes   Seizure Precautions: 1. If medication has been prescribed for you to prevent seizures, take it exactly as directed.  Do not stop taking the medicine without talking to your doctor first, even if you have not had a seizure in a long time.   2. Avoid activities in which a seizure would cause danger to yourself or to others.  Don't operate dangerous machinery, swim alone, or climb in high or dangerous places, such as on ladders, roofs, or girders.  Do not drive unless your doctor says you may.  3. If you have any warning that you may have a seizure, lay down in a safe place where you can't hurt yourself.    4.  No driving for 6 months from last seizure, as per [facility].   Please refer to the following link on the Epilepsy Foundation of [employer]'s website for more information: http://www.epilepsyfoundation.org/answerplace/Social/driving/drivingu.cfm   5.  Maintain good sleep hygiene. Avoid alcohol.  6.  Notify your neurology if you are planning pregnancy or if you become pregnant.  7.  Contact your doctor if you have any problems that may be related to the medicine you are taking.  8.  Call 911 and bring the patient back to the ED if:        A.  The seizure lasts longer than 5 minutes.       B.  The patient doesn't awaken shortly after the seizure  C.  The patient has new problems such as difficulty seeing, speaking or  moving  D.  The patient was injured during the seizure  E.  The patient has a temperature over 102 F (39C)  F.  The patient vomited and now is having trouble breathing

## 2023-08-13 ENCOUNTER — Encounter (HOSPITAL_COMMUNITY): Payer: Self-pay

## 2023-08-14 ENCOUNTER — Ambulatory Visit (HOSPITAL_COMMUNITY): Payer: 59 | Admitting: Licensed Clinical Social Worker

## 2023-08-20 ENCOUNTER — Other Ambulatory Visit: Payer: Self-pay | Admitting: Neurology

## 2023-08-27 ENCOUNTER — Ambulatory Visit: Payer: Self-pay | Admitting: Nurse Practitioner

## 2023-09-19 ENCOUNTER — Encounter: Payer: Self-pay | Admitting: Nurse Practitioner

## 2023-09-19 ENCOUNTER — Ambulatory Visit (INDEPENDENT_AMBULATORY_CARE_PROVIDER_SITE_OTHER): Payer: Self-pay | Admitting: Nurse Practitioner

## 2023-09-19 ENCOUNTER — Other Ambulatory Visit (HOSPITAL_COMMUNITY)
Admission: RE | Admit: 2023-09-19 | Discharge: 2023-09-19 | Disposition: A | Discharge status: Home / Self Care | Source: Ambulatory Visit | Attending: Nurse Practitioner | Admitting: Nurse Practitioner

## 2023-09-19 VITALS — BP 105/58 | HR 89 | Temp 97.5°F | Wt 169.6 lb

## 2023-09-19 DIAGNOSIS — Z0000 Encounter for general adult medical examination without abnormal findings: Secondary | ICD-10-CM | POA: Diagnosis not present

## 2023-09-19 DIAGNOSIS — Z1151 Encounter for screening for human papillomavirus (HPV): Secondary | ICD-10-CM | POA: Insufficient documentation

## 2023-09-19 DIAGNOSIS — Z716 Tobacco abuse counseling: Secondary | ICD-10-CM

## 2023-09-19 DIAGNOSIS — Z1329 Encounter for screening for other suspected endocrine disorder: Secondary | ICD-10-CM | POA: Diagnosis not present

## 2023-09-19 DIAGNOSIS — R87610 Atypical squamous cells of undetermined significance on cytologic smear of cervix (ASC-US): Secondary | ICD-10-CM | POA: Diagnosis not present

## 2023-09-19 DIAGNOSIS — F17210 Nicotine dependence, cigarettes, uncomplicated: Secondary | ICD-10-CM | POA: Diagnosis not present

## 2023-09-19 DIAGNOSIS — G43009 Migraine without aura, not intractable, without status migrainosus: Secondary | ICD-10-CM

## 2023-09-19 DIAGNOSIS — Z01419 Encounter for gynecological examination (general) (routine) without abnormal findings: Secondary | ICD-10-CM | POA: Insufficient documentation

## 2023-09-19 DIAGNOSIS — Z23 Encounter for immunization: Secondary | ICD-10-CM | POA: Diagnosis not present

## 2023-09-19 DIAGNOSIS — Z13228 Encounter for screening for other metabolic disorders: Secondary | ICD-10-CM

## 2023-09-19 DIAGNOSIS — R569 Unspecified convulsions: Secondary | ICD-10-CM

## 2023-09-19 DIAGNOSIS — R87810 Cervical high risk human papillomavirus (HPV) DNA test positive: Secondary | ICD-10-CM | POA: Diagnosis not present

## 2023-09-19 DIAGNOSIS — Z1321 Encounter for screening for nutritional disorder: Secondary | ICD-10-CM

## 2023-09-19 DIAGNOSIS — Z130 Encounter for screening for diseases of the blood and blood-forming organs and certain disorders involving the immune mechanism: Secondary | ICD-10-CM

## 2023-09-19 NOTE — Assessment & Plan Note (Signed)
 Annual exam as documented.  Counseling done include healthy lifestyle involving committing to 150 minutes of exercise per week, heart healthy diet, and attaining healthy weight. The importance of adequate sleep also discussed.  Regular use of seat belt and home safety were also discussed . Changes in health habits are decided on by patient with goals and time frames set for achieving them. Immunization and cancer screening  needs are specifically addressed at this visit.    1. ASCUS with positive high risk HPV cervical (Primary)   2. Annual physical exam   3. Screening for endocrine, nutritional, metabolic and immunity disorder  - Lipid panel - CBC - CMP14+EGFR  4. Need for influenza vaccination

## 2023-09-19 NOTE — Assessment & Plan Note (Signed)
 Smokes about 0.25 pack/day  Asked about quitting: confirms that he/she currently smokes cigarettes Advise to quit smoking: Educated about QUITTING to reduce the risk of cancer, cardio and cerebrovascular disease. Assess willingness: Unwilling to quit at this time, but is working on cutting back. Assist with counseling and pharmacotherapy: Counseled for 5 minutes and literature provided. Arrange for follow up: follow up in 12 months and continue to offer help.

## 2023-09-19 NOTE — Patient Instructions (Signed)
 Please consider getting pneumococcal vaccine at local pharmacy.    It is important that you exercise regularly at least 30 minutes 5 times a week as tolerated  Think about what you will eat, plan ahead. Choose " clean, green, fresh or frozen" over canned, processed or packaged foods which are more sugary, salty and fatty. 70 to 75% of food eaten should be vegetables and fruit. Three meals at set times with snacks allowed between meals, but they must be fruit or vegetables. Aim to eat over a 12 hour period , example 7 am to 7 pm, and STOP after  your last meal of the day. Drink water,generally about 64 ounces per day, no other drink is as healthy. Fruit juice is best enjoyed in a healthy way, by EATING the fruit.  Thanks for choosing [facility] we consider it a privelige to serve you.

## 2023-09-19 NOTE — Assessment & Plan Note (Signed)
 Currently well-controlled on Topamax 300 mg nightly Patient encouraged to maintain close follow-up with neurology

## 2023-09-19 NOTE — Assessment & Plan Note (Signed)

## 2023-09-19 NOTE — Progress Notes (Signed)
 Complete physical exam  Patient: Lisa Shaw   DOB: [birth year]   32 y.o. Female  MRN: [id number]  Subjective:    Chief Complaint  Patient presents with   Follow-up    Lisa Shaw is a 33 y.o. female who presents today for a complete physical exam. She reports consuming a general diet. The patient does not participate in regular exercise at present. She generally feels well. She reports sleeping well.   She was treated for abscess of Bartholin gland in November 2024.    Encouraged to call the office if she develops a boil as any new skin concern   Due for a repeat Pap smear, test completed today  Most recent fall risk assessment:    08/11/2023    9:45 AM  Fall Risk   Falls in the past year? 0  Number falls in past yr: 0  Injury with Fall? 0  Follow up Falls evaluation completed     Most recent depression screenings:    09/19/2023    9:21 AM 07/24/2023   11:21 AM  PHQ 2/9 Scores  PHQ - 2 Score 0 0  PHQ- 9 Score  0        Patient Care Team: Donell Beers, FNP as PCP - General (Nurse Practitioner) Van Clines, MD as Consulting Physician (Neurology)   Outpatient Medications Prior to Visit  Medication Sig   oxcarbazepine (TRILEPTAL) 600 MG tablet Take 1 tablet every night   topiramate (TOPAMAX) 100 MG tablet Take 3 tablets every night   ipratropium (ATROVENT) 0.06 % nasal spray Place 2 sprays into both nostrils 3 (three) times daily. As needed for nasal congestion, runny nose (Patient not taking: Reported on 09/19/2023)   levocetirizine (XYZAL) 5 MG tablet Take 1 tablet (5 mg total) by mouth every evening. (Patient not taking: Reported on 09/19/2023)   nortriptyline (PAMELOR) 10 MG capsule Take 1 capsule (10 mg total) by mouth at bedtime. (Patient not taking: Reported on 09/19/2023)   rizatriptan (MAXALT-MLT) 10 MG disintegrating tablet Take 1 tablet at onset of migraine. Do not take more than 3 a week. (Patient not taking: Reported on 09/19/2023)   No  facility-administered medications prior to visit.    Review of Systems  Constitutional:  Negative for appetite change, chills, fatigue and fever.  HENT:  Negative for congestion, postnasal drip, rhinorrhea and sneezing.   Eyes:  Negative for pain, discharge and itching.  Respiratory:  Negative for cough, shortness of breath and wheezing.   Cardiovascular:  Negative for chest pain, palpitations and leg swelling.  Gastrointestinal:  Negative for abdominal pain, constipation, nausea and vomiting.  Endocrine: Negative for cold intolerance, heat intolerance and polydipsia.  Genitourinary:  Negative for difficulty urinating, dysuria, flank pain and frequency.  Musculoskeletal:  Negative for arthralgias, back pain, joint swelling and myalgias.  Skin:  Negative for color change, pallor, rash and wound.  Neurological:  Negative for dizziness, facial asymmetry, weakness, numbness and headaches.  Psychiatric/Behavioral:  Negative for behavioral problems, confusion, self-injury and suicidal ideas.        Objective:     BP (!) 105/58   Pulse 89   Temp (!) 97.5 F (36.4 C)   Wt 169 lb 9.6 oz (76.9 kg)   SpO2 100%   BMI 29.11 kg/m    Physical Exam Vitals and nursing note reviewed. Exam conducted with a chaperone present.  Constitutional:      General: She is not in acute distress.    Appearance:  Normal appearance. She is not ill-appearing, toxic-appearing or diaphoretic.  HENT:     Right Ear: Tympanic membrane, ear canal and external ear normal. There is no impacted cerumen.     Left Ear: Tympanic membrane, ear canal and external ear normal. There is no impacted cerumen.     Nose: Nose normal. No congestion or rhinorrhea.     Mouth/Throat:     Mouth: Mucous membranes are moist.     Pharynx: Oropharynx is clear. No oropharyngeal exudate or posterior oropharyngeal erythema.  Eyes:     General: No scleral icterus.       Right eye: No discharge.        Left eye: No discharge.      Extraocular Movements: Extraocular movements intact.     Conjunctiva/sclera: Conjunctivae normal.  Neck:     Vascular: No carotid bruit.  Cardiovascular:     Rate and Rhythm: Normal rate and regular rhythm.     Pulses: Normal pulses.     Heart sounds: Normal heart sounds. No murmur heard.    No friction rub. No gallop.  Pulmonary:     Effort: Pulmonary effort is normal. No respiratory distress.     Breath sounds: Normal breath sounds. No stridor. No wheezing, rhonchi or rales.  Chest:     Chest wall: No mass, lacerations, deformity, swelling, tenderness or edema.  Breasts:    Tanner Score is 5.     Breasts are symmetrical.     Right: Normal. No swelling, bleeding, inverted nipple, mass, nipple discharge, skin change or tenderness.     Left: Normal. No swelling, bleeding, inverted nipple, mass, nipple discharge, skin change or tenderness.  Abdominal:     General: Bowel sounds are normal. There is no distension.     Palpations: Abdomen is soft. There is no mass.     Tenderness: There is no abdominal tenderness. There is no right CVA tenderness, left CVA tenderness, guarding or rebound.     Hernia: No hernia is present. There is no hernia in the left inguinal area or right inguinal area.  Genitourinary:    General: Normal vulva.     Exam position: Lithotomy position.     Pubic Area: No rash or pubic lice.      Tanner stage (genital): 5.     Labia:        Right: No rash, tenderness, lesion or injury.        Left: No rash, tenderness, lesion or injury.      Urethra: No prolapse, urethral pain, urethral swelling or urethral lesion.     Vagina: No signs of injury and foreign body. No vaginal discharge, erythema, tenderness, bleeding, lesions or prolapsed vaginal walls.     Cervix: No cervical motion tenderness, discharge, friability, lesion, erythema, cervical bleeding or eversion.     Uterus: Normal. Not enlarged, not fixed, not tender and no uterine prolapse.      Adnexa:         Right: No mass, tenderness or fullness.         Left: No mass, tenderness or fullness.    Musculoskeletal:        General: No swelling, tenderness, deformity or signs of injury.     Cervical back: Normal range of motion and neck supple. No rigidity or tenderness.     Right lower leg: No edema.     Left lower leg: No edema.  Lymphadenopathy:     Cervical: No cervical adenopathy.  Upper Body:     Right upper body: No supraclavicular, axillary or pectoral adenopathy.     Left upper body: No supraclavicular, axillary or pectoral adenopathy.     Lower Body: No right inguinal adenopathy. No left inguinal adenopathy.  Skin:    General: Skin is warm and dry.     Capillary Refill: Capillary refill takes less than 2 seconds.     Coloration: Skin is not jaundiced or pale.     Findings: No bruising, erythema, lesion or rash.  Neurological:     Mental Status: She is alert and oriented to person, place, and time.     Cranial Nerves: No cranial nerve deficit.     Sensory: No sensory deficit.     Motor: No weakness.     Coordination: Coordination normal.     Gait: Gait normal.     Deep Tendon Reflexes: Reflexes normal.  Psychiatric:        Mood and Affect: Mood normal.        Behavior: Behavior normal.        Thought Content: Thought content normal.        Judgment: Judgment normal.     No results found for any visits on 09/19/23.     Assessment & Plan:    Routine Health Maintenance and Physical Exam  Immunization History  Administered Date(s) Administered   Influenza Inj Mdck Quad Pf 06/10/2017   Influenza, Seasonal, Injecte, Preservative Fre 09/19/2023   Influenza,inj,Quad PF,6+ Mos 04/02/2019, 07/26/2022   PFIZER(Purple Top)SARS-COV-2 Vaccination 10/10/2019, 05/22/2020, 09/12/2020   Tdap 10/25/2016, 10/08/2022    Health Maintenance  Topic Date Due   Pneumococcal Vaccine 46-6 Years old (1 of 2 - PCV) Never done   COVID-19 Vaccine (4 - 2024-25 season) 03/16/2023   Cervical  Cancer Screening (Pap smear)  07/27/2023   Medicare Annual Wellness (AWV)  07/23/2024   DTaP/Tdap/Td (3 - Td or Tdap) 10/07/2032   INFLUENZA VACCINE  Completed   Hepatitis C Screening  Completed   HIV Screening  Completed   HPV VACCINES  Aged Out    Discussed health benefits of physical activity, and encouraged her to engage in regular exercise appropriate for her age and condition.  Problem List Items Addressed This Visit       Cardiovascular and Mediastinum   Migraine without aura and without status migrainosus, not intractable   Currently well-controlled on Topamax 300 mg nightly Patient encouraged to maintain close follow-up with neurology        Other   Seizures (HCC)   Denies any recent seizure activity Continue Trileptal 600 mg daily Encouraged to maintain close follow-up with neurology      Tobacco abuse counseling   Smokes about 0.25 pack/day  Asked about quitting: confirms that he/she currently smokes cigarettes Advise to quit smoking: Educated about QUITTING to reduce the risk of cancer, cardio and cerebrovascular disease. Assess willingness: Unwilling to quit at this time, but is working on cutting back. Assist with counseling and pharmacotherapy: Counseled for 5 minutes and literature provided. Arrange for follow up: follow up in 12 months and continue to offer help.       Annual physical exam - Primary   Annual exam as documented.  Counseling done include healthy lifestyle involving committing to 150 minutes of exercise per week, heart healthy diet, and attaining healthy weight. The importance of adequate sleep also discussed.  Regular use of seat belt and home safety were also discussed . Changes in health habits are decided  on by patient with goals and time frames set for achieving them. Immunization and cancer screening  needs are specifically addressed at this visit.    1. ASCUS with positive high risk HPV cervical (Primary)   2. Annual physical  exam   3. Screening for endocrine, nutritional, metabolic and immunity disorder  - Lipid panel - CBC - CMP14+EGFR  4. Need for influenza vaccination       Relevant Orders   Cytology - PAP([place])   Need for influenza vaccination   Patient educated on CDC recommendation for the vaccine. Verbal consent was obtained from the patient, vaccine administered by nurse, no sign of adverse reactions noted at this time. Patient education on arm soreness and use of tylenol for this patient  was discussed. Patient educated on the signs and symptoms of adverse effect and advise to contact the office if they occur. Vaccine information sheet given to patient.        Relevant Orders   Flu vaccine trivalent PF, 6mos and older(Flulaval,Afluria,Fluarix,Fluzone) (Completed)   ASCUS with positive high risk HPV cervical    - Cytology - PAP([place])       Other Visit Diagnoses       Screening for endocrine, nutritional, metabolic and immunity disorder       Relevant Orders   Lipid panel   CBC   CMP14+EGFR      Return in about 1 year (around 09/18/2024) for CPE.     Donell Beers, FNP

## 2023-09-19 NOTE — Assessment & Plan Note (Signed)
 Denies any recent seizure activity Continue Trileptal 600 mg daily Encouraged to maintain close follow-up with neurology

## 2023-09-19 NOTE — Assessment & Plan Note (Signed)
-   Cytology - PAP([place])   

## 2023-09-21 LAB — CBC
Hematocrit: 39.2 % (ref 34.0–46.6)
Hemoglobin: 13 g/dL (ref 11.1–15.9)
MCH: 35.7 pg — ABNORMAL HIGH (ref 26.6–33.0)
MCHC: 33.2 g/dL (ref 31.5–35.7)
MCV: 108 fL — ABNORMAL HIGH (ref 79–97)
Platelets: 326 10*3/uL (ref 150–450)
RBC: 3.64 x10E6/uL — ABNORMAL LOW (ref 3.77–5.28)
RDW: 13.2 % (ref 11.7–15.4)
WBC: 5.9 10*3/uL (ref 3.4–10.8)

## 2023-09-21 LAB — CMP14+EGFR
ALT: 15 IU/L (ref 0–32)
AST: 19 IU/L (ref 0–40)
Albumin: 4.4 g/dL (ref 3.9–4.9)
Alkaline Phosphatase: 130 IU/L — ABNORMAL HIGH (ref 44–121)
BUN/Creatinine Ratio: 14 (ref 9–23)
BUN: 10 mg/dL (ref 6–20)
Bilirubin Total: <0.2 mg/dL (ref 0.0–1.2)
Calcium: 9.4 mg/dL (ref 8.7–10.2)
Chloride: 112 mmol/L — ABNORMAL HIGH (ref 96–106)
Creatinine, Ser: 0.74 mg/dL (ref 0.57–1.00)
Globulin, Total: 2.6 g/dL (ref 1.5–4.5)
Glucose: 91 mg/dL (ref 70–99)
Potassium: 3.8 mmol/L (ref 3.5–5.2)
Sodium: 141 mmol/L (ref 134–144)
Total Protein: 7 g/dL (ref 6.0–8.5)
eGFR: 111 mL/min/{1.73_m2} (ref >59)

## 2023-09-21 LAB — LIPID PANEL
Chol/HDL Ratio: 4.3 ratio (ref 0.0–4.4)
Cholesterol, Total: 177 mg/dL (ref 100–199)
HDL: 41 mg/dL (ref >39)
LDL Chol Calc (NIH): 120 mg/dL — ABNORMAL HIGH (ref 0–99)
Triglycerides: 88 mg/dL (ref 0–149)
VLDL Cholesterol Cal: 16 mg/dL (ref 5–40)

## 2023-09-22 ENCOUNTER — Other Ambulatory Visit: Payer: Self-pay | Admitting: Nurse Practitioner

## 2023-09-22 DIAGNOSIS — D649 Anemia, unspecified: Secondary | ICD-10-CM

## 2023-09-23 ENCOUNTER — Other Ambulatory Visit: Payer: Self-pay

## 2023-09-23 DIAGNOSIS — D649 Anemia, unspecified: Secondary | ICD-10-CM

## 2023-09-24 ENCOUNTER — Other Ambulatory Visit

## 2023-09-24 DIAGNOSIS — D649 Anemia, unspecified: Secondary | ICD-10-CM

## 2023-09-24 LAB — SPECIMEN STATUS REPORT

## 2023-09-25 ENCOUNTER — Telehealth: Payer: Self-pay

## 2023-09-25 NOTE — Telephone Encounter (Signed)
 Copied from CRM [id number]. Topic: General - Other >> Sep 25, 2023  8:15 AM Lisa Shaw wrote: Reason for CRM: Patient requesting a callback from nurse to discuss lab results.

## 2023-09-26 LAB — VITAMIN B12: Vitamin B-12: 544 pg/mL (ref 232–1245)

## 2023-10-06 ENCOUNTER — Ambulatory Visit: Payer: Self-pay | Admitting: *Deleted

## 2023-10-06 NOTE — Telephone Encounter (Signed)
 Copied from CRM [id number]. Topic: Clinical - Prescription Issue >> Oct 06, 2023 12:43 PM Marland Kitchen D wrote: Patient needs a refill for a prescription but she doesn't know the name of it.  [id number] - [place], [place] - [address] AT [facility] OF [address] [place] [id number] Phone: [phone] Fax: [phone] Hours: Open 24 hours  Chief Complaint: Patient is requesting Rx- Doxycycline Symptoms: boil on buttock, nickel size, open and draining Frequency: last boil in November- different location Pertinent Negatives: Patient denies pain- better since opened- used warm soaks Disposition: [] ED /[] Urgent Care (no appt availability in office) / [] Appointment(In office/virtual)/ []  [place] [facility]/ [] Home Care/ [x] Refused Recommended Disposition /[] [place] [facility]/ []  Follow-up with PCP Additional Notes: Patient states she was told by her PCP to call if she had reoccurring boil and she would send antibiotic.    Reason for Disposition  Boil > 1/2 inch across (> 12 mm; larger than a marble)  Answer Assessment - Initial Assessment Questions 1. APPEARANCE of BOIL: "What does the boil look like?"      Open boil- draining  2. LOCATION: "Where is the boil located?"      R buttock 3. NUMBER: "How many boils are there?"      one 4. SIZE: "How big is the boil?" (e.g., inches, cm; compare to size of a coin or other object)     Nickel size 5. ONSET: "When did the boil start?"     Saturday- noticed 6. PAIN: "Is there any pain?" If Yes, ask: "How bad is the pain?"   (Scale 1-10; or mild, moderate, severe)     No pain 7. FEVER: "Do you have a fever?" If Yes, ask: "What is it, how was it measured, and when did it start?"      no 8. SOURCE: "Have you been around anyone with boils or other Staph infections?" "Have you ever had boils before?"     Hx boils- different area- last treatment at [facility]- November 9. OTHER SYMPTOMS: "Do you  have any other symptoms?" (e.g., shaking chills, weakness, rash elsewhere on body)     no  Protocols used: Boil (Skin Abscess)-A-AH

## 2023-10-07 LAB — CYTOLOGY - PAP
Comment: NEGATIVE
Comment: NEGATIVE
Comment: NEGATIVE
Diagnosis: UNDETERMINED — AB
HPV 16: NEGATIVE
HPV 18 / 45: POSITIVE — AB
High risk HPV: POSITIVE — AB

## 2023-10-08 ENCOUNTER — Encounter: Payer: Self-pay | Admitting: Nurse Practitioner

## 2023-10-08 ENCOUNTER — Ambulatory Visit (INDEPENDENT_AMBULATORY_CARE_PROVIDER_SITE_OTHER): Admitting: Nurse Practitioner

## 2023-10-08 VITALS — BP 112/69 | HR 86 | Temp 98.4°F | Wt 170.4 lb

## 2023-10-08 DIAGNOSIS — R87810 Cervical high risk human papillomavirus (HPV) DNA test positive: Secondary | ICD-10-CM

## 2023-10-08 DIAGNOSIS — R87610 Atypical squamous cells of undetermined significance on cytologic smear of cervix (ASC-US): Secondary | ICD-10-CM

## 2023-10-08 DIAGNOSIS — L0232 Furuncle of buttock: Secondary | ICD-10-CM

## 2023-10-08 NOTE — Assessment & Plan Note (Addendum)
 omponent Ref Range & Units (hover) 2 wk ago  High risk HPV Positive Abnormal   HPV 16 Negative  HPV 18 / 45 Positive Abnormal   Adequacy Satisfactory for evaluation; transformation zone component PRESENT.  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

## 2023-10-08 NOTE — Progress Notes (Signed)
 Acute Office Visit  Subjective:     Patient ID: Lisa Shaw, female    DOB: 06-19-1991, 33 y.o.   MRN: [id number]  Chief Complaint  Patient presents with   Mass    Boil on right buttocks    HPI Lisa Shaw has a past medical history of Acne vulgaris, ASCUS with positive high risk HPV cervical (09/17/2022), Depression (04/29/2017), Epilepsy (HCC), Migraine, Seizures (HCC) (07/18/2016), Sleep disturbance (04/29/2017), and Vitamin D deficiency (06/2020). Patient is in today for complaints of a painful  boil on the right buttock, stated that she has been doing warm soaks that drained the boil  naturally.  Stated that she has had a boil like this  in the past.  She denies fever, chills, pain    Review of Systems  Constitutional:  Negative for appetite change, chills, fatigue and fever.  HENT:  Negative for congestion, postnasal drip, rhinorrhea and sneezing.   Respiratory:  Negative for cough, shortness of breath and wheezing.   Cardiovascular:  Negative for chest pain, palpitations and leg swelling.  Gastrointestinal:  Negative for abdominal pain, constipation, nausea and vomiting.  Genitourinary:  Negative for difficulty urinating, dysuria, flank pain and frequency.  Musculoskeletal:  Negative for arthralgias, back pain, joint swelling and myalgias.  Skin:  Positive for rash. Negative for color change, pallor and wound.  Neurological:  Negative for dizziness, facial asymmetry, weakness, numbness and headaches.  Psychiatric/Behavioral:  Negative for behavioral problems, confusion, self-injury and suicidal ideas.         Objective:    BP 112/69   Pulse 86   Temp 98.4 F (36.9 C) (Oral)   Wt 170 lb 6.4 oz (77.3 kg)   SpO2 100%   BMI 29.25 kg/m    Physical Exam Vitals and nursing note reviewed. Exam conducted with a chaperone present.  Constitutional:      General: She is not in acute distress.    Appearance: Normal appearance. She is obese. She is not  ill-appearing, toxic-appearing or diaphoretic.  HENT:     Mouth/Throat:     Mouth: Mucous membranes are moist.     Pharynx: Oropharynx is clear. No oropharyngeal exudate or posterior oropharyngeal erythema.  Eyes:     General: No scleral icterus.       Right eye: No discharge.        Left eye: No discharge.     Extraocular Movements: Extraocular movements intact.     Conjunctiva/sclera: Conjunctivae normal.  Cardiovascular:     Rate and Rhythm: Normal rate and regular rhythm.     Pulses: Normal pulses.     Heart sounds: Normal heart sounds. No murmur heard.    No friction rub. No gallop.  Pulmonary:     Effort: Pulmonary effort is normal. No respiratory distress.     Breath sounds: Normal breath sounds. No stridor. No wheezing, rhonchi or rales.  Chest:     Chest wall: No tenderness.  Abdominal:     General: There is no distension.     Palpations: Abdomen is soft.     Tenderness: There is no abdominal tenderness. There is no right CVA tenderness, left CVA tenderness or guarding.  Genitourinary:    Comments: No rash, swelling, redness or discharge noted.  Affected site has completely healed Musculoskeletal:        General: No swelling, tenderness, deformity or signs of injury.     Right lower leg: No edema.     Left lower leg: No edema.  Skin:    General: Skin is warm and dry.     Capillary Refill: Capillary refill takes less than 2 seconds.     Coloration: Skin is not jaundiced or pale.     Findings: No bruising, erythema or lesion.  Neurological:     Mental Status: She is alert and oriented to person, place, and time.     Motor: No weakness.     Coordination: Coordination normal.     Gait: Gait normal.  Psychiatric:        Mood and Affect: Mood normal.        Behavior: Behavior normal.        Thought Content: Thought content normal.        Judgment: Judgment normal.     No results found for any visits on 10/08/23.      Assessment & Plan:   Problem List Items  Addressed This Visit       Musculoskeletal and Integument   Boil of buttock   The boil has healed completely No redness, swelling, or drainage noted Patient encouraged to use warm compress if the boil returns.  Encouraged to report redness, swelling, fever, chills        Other   ASCUS with positive high risk HPV cervical - Primary   omponent Ref Range & Units (hover) 2 wk ago  High risk HPV Positive Abnormal   HPV 16 Negative  HPV 18 / 45 Positive Abnormal   Adequacy Satisfactory for evaluation; transformation zone component PRESENT.  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   No follow-ups on file.  Donell Beers, FNP

## 2023-10-08 NOTE — Assessment & Plan Note (Signed)
 The boil has healed completely No redness, swelling, or drainage noted Patient encouraged to use warm compress if the boil returns.  Encouraged to report redness, swelling, fever, chills

## 2023-10-08 NOTE — Patient Instructions (Signed)

## 2023-10-11 ENCOUNTER — Other Ambulatory Visit: Payer: Self-pay | Admitting: Neurology

## 2023-10-16 ENCOUNTER — Other Ambulatory Visit (HOSPITAL_COMMUNITY)
Admission: RE | Admit: 2023-10-16 | Discharge: 2023-10-16 | Disposition: A | Discharge status: Home / Self Care | Source: Ambulatory Visit | Attending: Obstetrics and Gynecology | Admitting: Obstetrics and Gynecology

## 2023-10-16 ENCOUNTER — Encounter: Payer: Self-pay | Admitting: Obstetrics and Gynecology

## 2023-10-16 ENCOUNTER — Ambulatory Visit (INDEPENDENT_AMBULATORY_CARE_PROVIDER_SITE_OTHER): Admitting: Obstetrics and Gynecology

## 2023-10-16 VITALS — BP 110/73 | HR 99 | Ht 67.0 in | Wt 169.0 lb

## 2023-10-16 DIAGNOSIS — N898 Other specified noninflammatory disorders of vagina: Secondary | ICD-10-CM | POA: Diagnosis present

## 2023-10-16 DIAGNOSIS — R87610 Atypical squamous cells of undetermined significance on cytologic smear of cervix (ASC-US): Secondary | ICD-10-CM | POA: Insufficient documentation

## 2023-10-16 DIAGNOSIS — Z3202 Encounter for pregnancy test, result negative: Secondary | ICD-10-CM

## 2023-10-16 DIAGNOSIS — R87810 Cervical high risk human papillomavirus (HPV) DNA test positive: Secondary | ICD-10-CM | POA: Insufficient documentation

## 2023-10-16 LAB — POCT URINE PREGNANCY: Preg Test, Ur: NEGATIVE

## 2023-10-16 MED ORDER — METRONIDAZOLE 500 MG PO TABS: 500.0000 mg | ORAL_TABLET | Freq: Two times a day (BID) | ORAL | 0 refills | Status: AC

## 2023-10-16 MED ORDER — FLUCONAZOLE 150 MG PO TABS: 150.0000 mg | ORAL_TABLET | Freq: Once | ORAL | 0 refills | Status: AC

## 2023-10-16 NOTE — Addendum Note (Signed)
 Addended by: Warden Fillers on: 10/16/2023 04:55 PM   Modules accepted: Orders

## 2023-10-16 NOTE — Progress Notes (Addendum)
    GYNECOLOGY CLINIC COLPOSCOPY PROCEDURE NOTE  33 y.o. G0P0000 here for colposcopy for pap finding of:  Result Date Procedure Results Follow-ups  09/19/2023 Cytology - PAP(Lisa Shaw) High risk HPV: Positive (A) HPV 16: Negative HPV 18 / 45: Positive (A) Adequacy: Satisfactory for evaluation; transformation zone component PRESENT. Diagnosis: - Atypical squamous cells of undetermined significance (ASC-US) (A) Comment: Normal Reference Range HPV - Negative Comment: Normal Reference Range HPV 16- Negative Comment: Normal Reference Range HPV 16 18 45 -Negative   09/17/2022 Surgical pathology( Lisa Shaw/ [name]) SURGICAL PATHOLOGY: SURGICAL PATHOLOGY CASE: Lisa Shaw PATIENT: Lisa Shaw Surgical Pathology Report     Clinical History: ASCUS with negative high risk HPV (cm)     FINAL MICROSCOPIC DIAGNOSIS:  A. ENDOCERVIX, CURETTAGE: - Low-grade squamous intr...   07/26/2022 Cytology - PAP(Lisa Shaw) High risk HPV: Positive (A) Comment: Normal Reference Range Neisseria Gonorrhea - Negative Comment: Normal Reference Range HPV - Negative Neisseria Gonorrhea: Negative Chlamydia: Negative Trichomonas: Negative Adequacy: Satisfactory for evaluation; transformation zone component PRESENT. Diagnosis: - Atypical squamous cells of undetermined significance (ASC-US) (A) Microorganisms: Shift in flora suggestive of bacterial vaginosis Comment: Normal Reference Range Trichomonas - Negative Comment: Normal Reference Ranger Chlamydia - Negative   07/26/2022 HM PAP SMEAR HM Pap smear: see report   12/11/2016 HM PAP SMEAR HM Pap smear: wnl   12/11/2016 Cytology - PAP Lisa Shaw Adequacy: Satisfactory for evaluation  endocervical/transformation zone component ABSENT. Diagnosis: NEGATIVE FOR INTRAEPITHELIAL LESIONS OR MALIGNANCY. Chlamydia: Negative Neisseria Gonorrhea: Negative Candida vaginitis: Negative for Candida species Trichomonas: Negative Material Submitted:  CervicoVaginal Pap [ThinPrep Imaged]     Discussed role for HPV in cervical dysplasia, need for surveillance, nature of the procedure, and risks and benefits.  Pregnancy test: Lab Results  Component Value Date   PREGTESTUR Negative 02/24/2023    No Known Allergies  Patient given informed consent, signed copy in the chart, time out was performed.    Placed in lithotomy position. Cervix viewed with speculum and colposcope after application of acetic acid and lugols.   Colposcopy Adequacy Cervix fully visualized: Yes  SCJ fully visualized: No , somewhat stenotic appearing cervix  Colposcopy Findings no visible lesions  Corresponding biopsies were not obtained.    ECC specimen was obtained.  All specimens were labeled and sent to pathology.  Hemostatic measures: None  Complications: none  Patient tolerated the procedure well.  OBGyn Exam  Colposcopy Impressions Normal  Plan Likely paps every 6-12 months pending ECC, but not colpos every time pt has positive HPV .  Will try to limit excessive colposcopy  Patient was given post procedure instructions.  Will follow up pathology and manage accordingly; patient will be contacted with results and recommendations.  Routine preventative health maintenance measures emphasized. Vaginal swab taken for discharge. Fishy vaginal odor noted. Will empirically treat for BV.  Warden Fillers, MD

## 2023-10-16 NOTE — Progress Notes (Signed)
 33 y.o. GYN presents for COLPO, +High risk HPV, +HPV 18/45, ASCUS on PAP.  UPT Negative

## 2023-10-20 ENCOUNTER — Other Ambulatory Visit: Payer: Self-pay

## 2023-10-20 LAB — CERVICOVAGINAL ANCILLARY ONLY
Bacterial Vaginitis (gardnerella): POSITIVE — AB
Candida Glabrata: NEGATIVE
Candida Vaginitis: POSITIVE — AB
Comment: NEGATIVE
Comment: NEGATIVE
Comment: NEGATIVE
Comment: NEGATIVE
Trichomonas: NEGATIVE

## 2023-10-20 LAB — SURGICAL PATHOLOGY

## 2023-10-20 MED ORDER — FLUCONAZOLE 150 MG PO TABS: 150.0000 mg | ORAL_TABLET | Freq: Once | ORAL | 0 refills | Status: AC

## 2023-10-20 NOTE — Progress Notes (Unsigned)
Rx diflucan sent for +yeast

## 2023-11-18 ENCOUNTER — Ambulatory Visit: Payer: Self-pay

## 2023-11-18 ENCOUNTER — Ambulatory Visit: Payer: Self-pay | Admitting: Nurse Practitioner

## 2023-11-18 NOTE — Telephone Encounter (Addendum)
 Chief Complaint: Skin lesion Symptoms: boil suspected Frequency: onset saturday Pertinent Negatives: Patient denies fever Disposition: [] ED /[x] Urgent Care (no appt availability in office) / [] Appointment(In office/virtual)/ []  [facility]/ [] Home Care/ [] Refused Recommended Disposition /[x] [facility]/ []  Follow-up with PCP Additional Notes:  Nickle sized "boil' on her cheek started on Saturday. Redness present. Very painful. Not draining. The only thing that helps is showering. Acute evaluation advised, no acute appointments available in office, advised Mobile Bus, patient declines mobile bus and states she will proceed to urgent care. Educated on care advice as documented in protocol, patient verbalized understanding.  Addendum: upon closing out appointment desk noted acute visit with PCP today, reached back out to patient to offer this visit and patient accepts 3:40 acute visit with PCP today.    Message from [facility] sent at 11/18/2023 10:13 AM EDT  Summary: swelling pain / infection   Copied From CRM #[id number]. Reason for Triage: Patient has a boil on her right cheek close to the bottom and Patient said its very painful and only relief is when she takes a hot shower Patient would like to speak with a nurse about care or treatment or cream possibly its infected soft in the middle hard around the edges Patient number [phone] (M)      Reason for Disposition  [1] Boil > 1/2 inch across (> 12 mm; larger than a marble) AND [2] center is soft or pus colored  Protocols used: Boil (Skin Abscess)-A-AH

## 2023-12-09 ENCOUNTER — Ambulatory Visit: Payer: 59 | Admitting: Neurology

## 2023-12-09 ENCOUNTER — Encounter: Payer: Self-pay | Admitting: Neurology

## 2024-05-25 ENCOUNTER — Encounter: Payer: Self-pay | Admitting: Neurology

## 2024-06-10 ENCOUNTER — Emergency Department (HOSPITAL_BASED_OUTPATIENT_CLINIC_OR_DEPARTMENT_OTHER)
Admission: EM | Admit: 2024-06-10 | Discharge: 2024-06-10 | Disposition: A | Discharge status: Home / Self Care | Attending: Emergency Medicine | Admitting: Emergency Medicine

## 2024-06-10 ENCOUNTER — Other Ambulatory Visit: Payer: Self-pay

## 2024-06-10 ENCOUNTER — Encounter (HOSPITAL_BASED_OUTPATIENT_CLINIC_OR_DEPARTMENT_OTHER): Payer: Self-pay | Admitting: Emergency Medicine

## 2024-06-10 DIAGNOSIS — N3000 Acute cystitis without hematuria: Secondary | ICD-10-CM | POA: Insufficient documentation

## 2024-06-10 DIAGNOSIS — R350 Frequency of micturition: Secondary | ICD-10-CM | POA: Diagnosis present

## 2024-06-10 DIAGNOSIS — N898 Other specified noninflammatory disorders of vagina: Secondary | ICD-10-CM | POA: Diagnosis not present

## 2024-06-10 LAB — URINALYSIS, ROUTINE W REFLEX MICROSCOPIC
Bilirubin Urine: NEGATIVE
Glucose, UA: NEGATIVE mg/dL
Hgb urine dipstick: NEGATIVE
Ketones, ur: NEGATIVE mg/dL
Nitrite: NEGATIVE
Protein, ur: NEGATIVE mg/dL
Specific Gravity, Urine: 1.014 (ref 1.005–1.030)
pH: 7.5 (ref 5.0–8.0)

## 2024-06-10 LAB — PREGNANCY, URINE: Preg Test, Ur: NEGATIVE

## 2024-06-10 LAB — WET PREP, GENITAL
Clue Cells Wet Prep HPF POC: NONE SEEN
Sperm: NONE SEEN
Trich, Wet Prep: NONE SEEN
WBC, Wet Prep HPF POC: <10 (ref <10)
Yeast Wet Prep HPF POC: NONE SEEN

## 2024-06-10 MED ORDER — CEPHALEXIN 500 MG PO CAPS: 500.0000 mg | ORAL_CAPSULE | Freq: Two times a day (BID) | ORAL | 0 refills | Status: AC

## 2024-06-10 MED ORDER — CEPHALEXIN 250 MG PO CAPS
500.0000 mg | ORAL_CAPSULE | Freq: Once | ORAL | Status: AC
  Administered 2024-06-10: 500 mg via ORAL
  Filled 2024-06-10: qty 2

## 2024-06-10 NOTE — ED Triage Notes (Signed)
 Pt endorses vaginal d/c with odor x 5 days

## 2024-06-10 NOTE — Discharge Instructions (Signed)
 You were found to have a urinary tract infection (UTI) today  Your urine has been sent off for culture to see what bacteria is in your urine. If your antibiotic needs to be changed, you will be contacted.  Your testing for yeast infection and bacterial vaginosis was negative.  The testing for gonorrhea and chlamydia still in process.  You will be contacted if either these are positive to undergo further treatment.  Medications: You have been prescribed an antibiotic called Cephalexin  (Keflex ). Take this antibiotic 2 times a day for the next 5 days. Take the full course of your antibiotic even if you start feeling better. Antibiotics may cause you to have diarrhea.  Follow-up instructions: Please follow-up with your primary care provider if symptoms are not resolved within the next 5 days.  Return instructions:  Please return to the Emergency Department if you: Develop confusion or become poorly responsive or faint Develop a fever above 100.42F Worsening pain You have persistent vomiting and/or are unable to keep medications down Please return if you have any other emergent concerns.

## 2024-06-10 NOTE — ED Notes (Signed)
 Reviewed AVS/discharge instructions with patient. Time allotted for and all questions answered. Patient is agreeable for d/c and escorted to ED exit by staff.

## 2024-06-10 NOTE — ED Provider Notes (Signed)
 [place] EMERGENCY DEPARTMENT AT [facility] Provider Note   CSN: [id number] Arrival date & time: 06/10/24  1320     Patient presents with: Vaginal Discharge   Lisa Shaw is a 33 y.o. female with no significant past medical history presents with concern for increased vaginal discharge for the past 5 days.  She reports she took over-the-counter Azo and did have improvement in the vaginal odor she was experiencing.  She reports increased urinary frequency, but denies any dysuria or hematuria.  Denies any abdominal pain or flank pain.  No fever or chills.  She reports she is sexually active but does not have any known exposure to STIs.    Vaginal Discharge      Prior to Admission medications   Medication Sig Start Date End Date Taking? Authorizing Provider  cephALEXin  (KEFLEX ) 500 MG capsule Take 1 capsule (500 mg total) by mouth 2 (two) times daily for 5 days. 06/10/24 06/15/24 Yes Veta Palma, PA-C  ipratropium (ATROVENT ) 0.06 % nasal spray Place 2 sprays into both nostrils 3 (three) times daily. As needed for nasal congestion, runny nose Patient not taking: Reported on 10/08/2023 07/21/23   Joesph Shaver Scales, PA-C  levocetirizine (XYZAL ) 5 MG tablet Take 1 tablet (5 mg total) by mouth every evening. Patient not taking: Reported on 10/08/2023 07/21/23 01/17/24  Joesph Shaver Scales, PA-C  nortriptyline  (PAMELOR ) 10 MG capsule Take 1 capsule (10 mg total) by mouth at bedtime. Patient not taking: Reported on 10/08/2023 08/11/23   Georjean Darice HERO, MD  oxcarbazepine  (TRILEPTAL ) 600 MG tablet Take 1 tablet every night 08/11/23   Aquino, Karen M, MD  rizatriptan  (MAXALT -MLT) 10 MG disintegrating tablet Take 1 tablet at onset of migraine. Do not take more than 3 a week. Patient not taking: Reported on 10/08/2023 08/11/23   Georjean Darice HERO, MD  topiramate  (TOPAMAX ) 100 MG tablet Take 3 tablets every night 08/11/23   Aquino, Karen M, MD    Allergies: Patient has no known  allergies.    Review of Systems  Genitourinary:  Positive for vaginal discharge.    Updated Vital Signs BP 94/71   Pulse 87   Temp 97.9 F (36.6 C)   Resp 17   Wt 70.3 kg   LMP 05/15/2024   SpO2 100%   BMI 24.28 kg/m   Physical Exam Vitals and nursing note reviewed.  Constitutional:      General: She is not in acute distress.    Appearance: She is well-developed.  HENT:     Head: Normocephalic and atraumatic.  Eyes:     Conjunctiva/sclera: Conjunctivae normal.  Cardiovascular:     Rate and Rhythm: Normal rate and regular rhythm.     Heart sounds: No murmur heard. Pulmonary:     Effort: Pulmonary effort is normal. No respiratory distress.     Breath sounds: Normal breath sounds.  Abdominal:     Palpations: Abdomen is soft.     Tenderness: There is abdominal tenderness.     Comments: Mild suprapubic abdominal tenderness to palpation.  No rebound or guarding  Genitourinary:    Comments: Pelvic exam performed with RN Ronal Fellows present  Patient with no blood or abnormal vaginal discharge in the vaginal vault.  No cervical motion tenderness Musculoskeletal:        General: No swelling.     Cervical back: Neck supple.  Skin:    General: Skin is warm and dry.     Capillary Refill: Capillary refill takes less than  2 seconds.  Neurological:     Mental Status: She is alert.  Psychiatric:        Mood and Affect: Mood normal.     (all labs ordered are listed, but only abnormal results are displayed) Labs Reviewed  URINALYSIS, ROUTINE W REFLEX MICROSCOPIC - Abnormal; Notable for the following components:      Result Value   APPearance HAZY (*)    Leukocytes,Ua SMALL (*)    Bacteria, UA RARE (*)    All other components within normal limits  WET PREP, GENITAL  URINE CULTURE  PREGNANCY, URINE  GC/CHLAMYDIA PROBE AMP ([place]) NOT AT [facility]    EKG: None  Radiology: No results found.   Procedures   Medications Ordered in the ED  cephALEXin  (KEFLEX )  capsule 500 mg (500 mg Oral Given 06/10/24 1553)                                    Medical Decision Making Amount and/or Complexity of Data Reviewed Labs: ordered.  Risk Prescription drug management.     Differential diagnosis includes but is not limited to STI, yeast infection, BV, cystitis, pyelonephritis  ED Course:  Upon initial evaluation, patient is well-appearing, no acute distress.  Normal vital signs.  Pelvic exam performed with RN chaperone present.  No abnormal vaginal discharge noted or blood noted in the vaginal vault.  No cervical motion tenderness.  She does have mild suprapubic abdominal tenderness to palpation.  Labs Ordered: I Ordered, and personally interpreted labs.  The pertinent results include:   Urinalysis with white blood cells present.  No red blood cells present.  Appears to be a clean-catch as there are no squamous epithelial cells present Pregnancy test negative Wet prep negative for yeast, trichomoniasis, clue cells  Medications Given: Keflex   Upon re-evaluation, patient remains well-appearing with stable vitals.  She does not have any abnormal vaginal discharge on my exam, wet prep negative for yeast, trichomoniasis, and BV.  She denies any known exposure to STIs, no indication for prophylactic treatment.  Will wait on gonorrhea and Chlamydia testing to result.  Her urine does have white blood cells present and bacteria noted.  Does appear to be a clean-catch.  Given her increased urinary frequency, will treat for UTI with course of Keflex .  First dose given here today.  No concern for pyelonephritis given no flank pain, no fevers.  Stable and appropriate for discharge home    Impression: Acute cystitis  Disposition:  The patient was discharged home with instructions to take 5-day course of Keflex  as prescribed.  She understands that the gonorrhea and Chlamydia testing is still pending and if either positive, she will be contacted to undergo  further treatment.  Follow-up with PCP if symptoms not improving within the next 5 days. Return precautions given and patient verbalized understanding.   This chart was dictated using voice recognition software, Dragon. Despite the best efforts of this provider to proofread and correct errors, errors may still occur which can change documentation meaning.       Final diagnoses:  Acute cystitis without hematuria    ED Discharge Orders          Ordered    cephALEXin  (KEFLEX ) 500 MG capsule  2 times daily        06/10/24 1549               Veta Palma, PA-C 06/10/24  1609    Yolande Lamar BROCKS, MD 06/11/24 [phone]

## 2024-06-11 LAB — GC/CHLAMYDIA PROBE AMP (~~LOC~~) NOT AT ARMC
Chlamydia: NEGATIVE
Comment: NEGATIVE
Comment: NORMAL
Neisseria Gonorrhea: NEGATIVE

## 2024-06-13 LAB — URINE CULTURE: Culture: >=100000 — AB

## 2024-06-14 ENCOUNTER — Telehealth (HOSPITAL_BASED_OUTPATIENT_CLINIC_OR_DEPARTMENT_OTHER): Payer: Self-pay | Admitting: *Deleted

## 2024-06-14 NOTE — Telephone Encounter (Signed)
 Post ED Visit - Positive Culture Follow-up  Culture report reviewed by antimicrobial stewardship pharmacist: [facility] [x]  Leonor Bash, [place].D. []  Venetia Gully, Pharm.D., BCPS AQ-ID []  Garrel Crews, Pharm.D., BCPS []  Almarie Lunger, [address].D., BCPS []  [place], [address].D., BCPS, AAHIVP []  Rosaline Bihari, Pharm.D., BCPS, AAHIVP []  Vernell Meier, PharmD, BCPS []  Latanya Hint, PharmD, BCPS []  Donald Medley, PharmD, BCPS []  Rocky Bold, PharmD []  Dorothyann Alert, PharmD, BCPS []  Morene Babe, PharmD  [facility] []  Rosaline Edison, PharmD []  Romona Bliss, PharmD []  Dolphus Roller, PharmD []  Veva Seip, Rph []  Vernell Daunt) Leonce, PharmD []  Eva Allis, PharmD []  Rosaline Millet, PharmD []  Iantha Batch, PharmD []  Arvin Gauss, PharmD []  Wanda Hasting, PharmD []  Ronal Rav, PharmD []  Rocky Slade, PharmD []  Bard Jeans, PharmD   Positive urine culture Treated with cephalexin , organism sensitive to the same and no further patient follow-up is required at this time.  Lorita Barnie Pereyra 06/14/2024, 3:12 PM

## 2024-06-21 ENCOUNTER — Inpatient Hospital Stay: Payer: Self-pay | Admitting: Nurse Practitioner

## 2024-06-22 ENCOUNTER — Encounter: Payer: Self-pay | Admitting: Nurse Practitioner

## 2024-06-22 ENCOUNTER — Ambulatory Visit (INDEPENDENT_AMBULATORY_CARE_PROVIDER_SITE_OTHER): Admitting: Nurse Practitioner

## 2024-06-22 VITALS — BP 96/59 | HR 86 | Wt 157.0 lb

## 2024-06-22 DIAGNOSIS — G43009 Migraine without aura, not intractable, without status migrainosus: Secondary | ICD-10-CM | POA: Diagnosis not present

## 2024-06-22 DIAGNOSIS — N3000 Acute cystitis without hematuria: Secondary | ICD-10-CM | POA: Diagnosis not present

## 2024-06-22 DIAGNOSIS — Z23 Encounter for immunization: Secondary | ICD-10-CM

## 2024-06-22 DIAGNOSIS — R569 Unspecified convulsions: Secondary | ICD-10-CM | POA: Diagnosis not present

## 2024-06-22 DIAGNOSIS — Z716 Tobacco abuse counseling: Secondary | ICD-10-CM

## 2024-06-22 DIAGNOSIS — Z09 Encounter for follow-up examination after completed treatment for conditions other than malignant neoplasm: Secondary | ICD-10-CM | POA: Insufficient documentation

## 2024-06-22 DIAGNOSIS — F17210 Nicotine dependence, cigarettes, uncomplicated: Secondary | ICD-10-CM | POA: Diagnosis not present

## 2024-06-22 DIAGNOSIS — F411 Generalized anxiety disorder: Secondary | ICD-10-CM | POA: Diagnosis not present

## 2024-06-22 LAB — POCT URINE DIPSTICK
Blood, UA: NEGATIVE
Glucose, UA: NEGATIVE mg/dL
Ketones, POC UA: NEGATIVE mg/dL
Leukocytes, UA: NEGATIVE
Nitrite, UA: NEGATIVE
Spec Grav, UA: 1.025 (ref 1.010–1.025)
Urobilinogen, UA: 1 U/dL
pH, UA: 7 (ref 5.0–8.0)

## 2024-06-22 NOTE — Assessment & Plan Note (Signed)
 Hospital chart reviewed, including discharge summary Medications reconciled and reviewed with the patient in detail

## 2024-06-22 NOTE — Assessment & Plan Note (Signed)
  Managed with Topamax  300 mg daily, headache medications effective. - Continue Topamax  300 mg daily. Rizatriptan  10 mg as needed Maintain close follow-up with neurology

## 2024-06-22 NOTE — Assessment & Plan Note (Signed)
 Seizure disorder Managed with Trileptal  600 mg daily - Maintain follow-up with neurologist.

## 2024-06-22 NOTE — Assessment & Plan Note (Signed)
    06/22/2024    2:21 PM 09/19/2023    8:43 AM  GAD 7 : Generalized Anxiety Score  Nervous, Anxious, on Edge 1 0  Control/stop worrying 1 0  Worry too much - different things 1 0  Trouble relaxing 0 0  Restless 0 0  Easily annoyed or irritable 0 0  Afraid - awful might happen 0 0  Total GAD 7 Score 3 0  Anxiety Difficulty Not difficult at all Not difficult at all  Will monitor  Written materials provided

## 2024-06-22 NOTE — Assessment & Plan Note (Addendum)
 Smokes about 3 cigarettes/day   Asked about quitting: confirms that she currently smokes cigarettes Advise to quit smoking: Educated about QUITTING to reduce the risk of cancer, cardio and cerebrovascular disease. Assess willingness: Unwilling to quit at this time, but is working on cutting back. Assist with counseling and pharmacotherapy: Counseled for 3 minutes and literature provided. Arrange for follow up: follow up in 3 months and continue to offer help.

## 2024-06-22 NOTE — Progress Notes (Signed)
 Established Patient Office Visit  Subjective:  Patient ID: Lisa Shaw, female    DOB: 19-Jan-1991  Age: 33 y.o. MRN: [id number]  CC:  Chief Complaint  Patient presents with   Hospitalization Follow-up    Vaginal discharge, would like to have urine tested to make sure its not cloudy or bubbly     HPI    Discussed the use of AI scribe software for clinical note transcription with the patient, who gave verbal consent to proceed.  History of Present Illness Lisa Shaw is a 33 year old female who presents with concerns about a urinary tract infection (UTI).  She visited urgent care during Thanksgiving due to cloudy urine and was diagnosed with a bacterial infection. She experienced discharge and a slight odor in her urine. A Viprep test was negative for yeast, Trichomonas, and bacterial vaginosis. She was tested for gonorrhea and chlamydia and treated for a UTI with a course of Keflex , which she completed.  Currently on her menstrual cycle, she finds it difficult to determine if symptoms such as minor itching and discharge have resolved. Urinary frequency is normal, but she is concerned about the color and odor of her urine. The urine was not cloudy earlier today.  She smokes about three cigarettes a day. She has not received her flu shot yet but is open to getting it.  She experiences anxiety, describing it as nervousness occurring several days in the past two weeks, but denies depression. She is able to relax at home and does not feel easily annoyed or irritable.  For her migraines, she continues to take Topamax  at a dose of 300 mg daily. She reports taking  Trileptal  for seizures      Assessment & Plan       Past Medical History:  Diagnosis Date   Acne vulgaris    ASCUS with positive high risk HPV cervical 09/17/2022   Depression 04/29/2017   Epilepsy (HCC)    Migraine    Seizures (HCC) 07/18/2016   Sleep disturbance 04/29/2017   Vitamin D  deficiency  06/2020    No past surgical history on file.  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[employer] [place]  Tobacco Use   Smoking status: Every Day    Current packs/day: 0.25    Average packs/day: 0.3 packs/day for 4.0 years (1.0 ttl pk-yrs)    Types: Cigarettes   Smokeless tobacco: Never   Tobacco comments:    6-7 cigarettes per day  Vaping Use   Vaping status: Never Used  Substance and Sexual Activity   Alcohol use: No   Drug use: Yes    Types: Marijuana    Comment: trying to quit   Sexual activity: Yes    Birth control/protection: Condom  Other Topics Concern   Not on file  Social History Narrative   Patient lives home alone    Right handed    07/24/2023: 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 and will graduate in 2026  Social Drivers of Corporate Investment Banker Strain: Low Risk  (07/24/2023)   Overall Financial Resource Strain (CARDIA)    Difficulty of Paying Living Expenses: Not very hard  Food Insecurity: No Food Insecurity (07/24/2023)   Hunger Vital Sign    Worried About Running Out of Food in the Last Year: Never true    Ran Out of Food in the Last Year: Never true  Transportation Needs: No Transportation Needs (07/24/2023)   PRAPARE - Administrator, Civil Service (Medical): No    Lack of Transportation (Non-Medical): No  Physical Activity: Insufficiently Active (07/24/2023)   Exercise Vital Sign    Days of Exercise per Week: 7 days    Minutes of Exercise per Session: 20 min  Stress: No Stress Concern Present (07/24/2023)   [employer] of Occupational  Health - Occupational Stress Questionnaire    Feeling of Stress : Not at all  Social Connections: Moderately Isolated (07/24/2023)   Social Connection and Isolation Panel    Frequency of Communication with Friends and Family: More than three times a week    Frequency of Social Gatherings with Friends and Family: Twice a week    Attends Religious Services: 1 to 4 times per year    Active Member of [employer] or Organizations: No    Attends Banker Meetings: Never    Marital Status: Never married  Intimate Partner Violence: Not At Risk (07/24/2023)   Humiliation, Afraid, Rape, and Kick questionnaire    Fear of Current or Ex-Partner: No    Emotionally Abused: No    Physically Abused: No    Sexually Abused: No    Outpatient Medications Prior to Visit  Medication Sig Dispense Refill   oxcarbazepine  (TRILEPTAL ) 600 MG tablet Take 1 tablet every night 30 tablet 11   topiramate  (TOPAMAX ) 100 MG tablet Take 3 tablets every night 90 tablet 11   ipratropium (ATROVENT ) 0.06 % nasal spray Place 2 sprays into both nostrils 3 (three) times daily. As needed for nasal congestion, runny nose (Patient not taking: Reported on 10/08/2023) 15 mL 1   levocetirizine (XYZAL ) 5 MG tablet Take 1 tablet (5 mg total) by mouth every evening. (Patient not taking: Reported on 10/08/2023) 90 tablet 1   nortriptyline  (PAMELOR ) 10 MG capsule Take 1 capsule (10 mg total) by mouth at bedtime. (Patient not taking: Reported on 10/08/2023) 30 capsule 11   rizatriptan  (MAXALT -MLT) 10 MG disintegrating tablet Take 1 tablet at onset of migraine. Do not take more than 3 a week. (Patient not taking: Reported on 10/08/2023) 9 tablet 11   No facility-administered medications prior to visit.    No Known Allergies  ROS Review of Systems  Constitutional:  Negative for appetite change, chills, fatigue and fever.  HENT:  Negative for congestion, postnasal drip, rhinorrhea and sneezing.   Respiratory:  Negative for cough, shortness  of breath and wheezing.   Cardiovascular:  Negative for chest pain, palpitations and leg swelling.  Gastrointestinal:  Negative for abdominal pain, constipation, nausea and vomiting.  Genitourinary:  Negative for difficulty urinating, dysuria, flank pain and frequency.  Musculoskeletal:  Negative for arthralgias, back pain, joint swelling and myalgias.  Skin:  Negative for color change, pallor, rash and wound.  Neurological:  Negative for dizziness, facial asymmetry, weakness, numbness and headaches.  Psychiatric/Behavioral:  Negative for behavioral problems, confusion, self-injury and suicidal ideas.       Objective:    Physical Exam Vitals and nursing note reviewed.  Constitutional:  General: She is not in acute distress.    Appearance: Normal appearance. She is not ill-appearing, toxic-appearing or diaphoretic.  Eyes:     General: No scleral icterus.       Right eye: No discharge.        Left eye: No discharge.     Extraocular Movements: Extraocular movements intact.     Conjunctiva/sclera: Conjunctivae normal.  Cardiovascular:     Rate and Rhythm: Normal rate and regular rhythm.     Pulses: Normal pulses.     Heart sounds: Normal heart sounds. No murmur heard.    No friction rub. No gallop.  Pulmonary:     Effort: Pulmonary effort is normal. No respiratory distress.     Breath sounds: Normal breath sounds. No stridor. No wheezing, rhonchi or rales.  Chest:     Chest wall: No tenderness.  Abdominal:     General: There is no distension.     Palpations: Abdomen is soft.     Tenderness: There is no abdominal tenderness. There is no right CVA tenderness, left CVA tenderness or guarding.  Musculoskeletal:        General: No swelling, tenderness, deformity or signs of injury.     Right lower leg: No edema.     Left lower leg: No edema.  Skin:    General: Skin is warm and dry.     Capillary Refill: Capillary refill takes less than 2 seconds.     Coloration: Skin is not  jaundiced or pale.     Findings: No bruising, erythema or lesion.  Neurological:     Mental Status: She is alert and oriented to person, place, and time.     Motor: No weakness.     Gait: Gait normal.  Psychiatric:        Mood and Affect: Mood normal.        Behavior: Behavior normal.        Thought Content: Thought content normal.        Judgment: Judgment normal.     BP (!) 96/59 (BP Location: Left Arm, Patient Position: Sitting, Cuff Size: Large)   Pulse 86   Wt 157 lb (71.2 kg)   LMP 05/15/2024   SpO2 100%   BMI 24.59 kg/m  Wt Readings from Last 3 Encounters:  06/22/24 157 lb (71.2 kg)  06/10/24 155 lb (70.3 kg)  10/16/23 169 lb (76.7 kg)    Lab Results  Component Value Date   TSH 0.777 08/07/2022   Lab Results  Component Value Date   WBC 5.9 09/19/2023   HGB 13.0 09/19/2023   HCT 39.2 09/19/2023   MCV 108 (H) 09/19/2023   PLT 326 09/19/2023   Lab Results  Component Value Date   NA 141 09/19/2023   K 3.8 09/19/2023   CO2 CANCELED 09/19/2023   GLUCOSE 91 09/19/2023   BUN 10 09/19/2023   CREATININE 0.74 09/19/2023   BILITOT <0.2 09/19/2023   ALKPHOS 130 (H) 09/19/2023   AST 19 09/19/2023   ALT 15 09/19/2023   PROT 7.0 09/19/2023   ALBUMIN 4.4 09/19/2023   CALCIUM 9.4 09/19/2023   ANIONGAP 9 05/14/2018   EGFR 111 09/19/2023   Lab Results  Component Value Date   CHOL 177 09/19/2023   Lab Results  Component Value Date   HDL 41 09/19/2023   Lab Results  Component Value Date   LDLCALC 120 (H) 09/19/2023   Lab Results  Component Value Date   TRIG 88 09/19/2023  Lab Results  Component Value Date   CHOLHDL 4.3 09/19/2023   Lab Results  Component Value Date   HGBA1C 5.0 08/05/2022      Assessment & Plan:   Problem List Items Addressed This Visit       Cardiovascular and Mediastinum   Migraine without aura and without status migrainosus, not intractable    Managed with Topamax  300 mg daily, headache medications effective. - Continue  Topamax  300 mg daily. Rizatriptan  10 mg as needed Maintain close follow-up with neurology          Genitourinary   Acute cystitis without hematuria    Recent UTI treated with Keflex , symptoms resolved, no current UTI symptoms, negative STD and yeast infection tests. - Ensure hydration with at least 64 ounces of water daily. - Practice good perineal hygiene, wiping front to back. She would like to be retested today ,UA negative for UTI today          Other   Seizures (HCC)   Seizure disorder Managed with Trileptal  600 mg daily - Maintain follow-up with neurologist.       GAD (generalized anxiety disorder)      06/22/2024    2:21 PM 09/19/2023    8:43 AM  GAD 7 : Generalized Anxiety Score  Nervous, Anxious, on Edge 1 0  Control/stop worrying 1 0  Worry too much - different things 1 0  Trouble relaxing 0 0  Restless 0 0  Easily annoyed or irritable 0 0  Afraid - awful might happen 0 0  Total GAD 7 Score 3 0  Anxiety Difficulty Not difficult at all Not difficult at all  Will monitor  Written materials provided       Tobacco abuse counseling   Smokes about 3 cigarettes/day   Asked about quitting: confirms that she currently smokes cigarettes Advise to quit smoking: Educated about QUITTING to reduce the risk of cancer, cardio and cerebrovascular disease. Assess willingness: Unwilling to quit at this time, but is working on cutting back. Assist with counseling and pharmacotherapy: Counseled for 3 minutes and literature provided. Arrange for follow up: follow up in 3 months and continue to offer help.       Need for influenza vaccination - Primary   Encounter for examination following treatment at hospital   Hospital chart reviewed, including discharge summary Medications reconciled and reviewed with the patient in detail        No orders of the defined types were placed in this encounter.   Follow-up: No follow-ups on file.    Declan Mier R Kaci Dillie, FNP

## 2024-06-22 NOTE — Patient Instructions (Signed)

## 2024-06-22 NOTE — Addendum Note (Signed)
 Addended by: Mandrell Vangilder R on: 06/22/2024 02:54 PM   Modules accepted: Orders

## 2024-06-22 NOTE — Assessment & Plan Note (Addendum)
  Recent UTI treated with Keflex , symptoms resolved, no current UTI symptoms, negative STD and yeast infection tests. - Ensure hydration with at least 64 ounces of water daily. - Practice good perineal hygiene, wiping front to back. She would like to be retested today ,UA negative for UTI today

## 2024-07-27 ENCOUNTER — Encounter: Payer: Self-pay | Admitting: Neurology

## 2024-07-27 ENCOUNTER — Ambulatory Visit (INDEPENDENT_AMBULATORY_CARE_PROVIDER_SITE_OTHER): Admitting: Neurology

## 2024-07-27 VITALS — BP 117/80 | HR 100 | Wt 154.2 lb

## 2024-07-27 DIAGNOSIS — G43009 Migraine without aura, not intractable, without status migrainosus: Secondary | ICD-10-CM

## 2024-07-27 DIAGNOSIS — G40009 Localization-related (focal) (partial) idiopathic epilepsy and epileptic syndromes with seizures of localized onset, not intractable, without status epilepticus: Secondary | ICD-10-CM | POA: Diagnosis not present

## 2024-07-27 MED ORDER — OXCARBAZEPINE 600 MG PO TABS: ORAL_TABLET | ORAL | 11 refills | Status: AC

## 2024-07-27 MED ORDER — NORTRIPTYLINE HCL 10 MG PO CAPS: 10.0000 mg | ORAL_CAPSULE | Freq: Every day | ORAL | 11 refills | Status: AC

## 2024-07-27 MED ORDER — TOPIRAMATE 100 MG PO TABS: ORAL_TABLET | ORAL | 11 refills | Status: AC

## 2024-07-27 MED ORDER — RIZATRIPTAN BENZOATE 10 MG PO TBDP: ORAL_TABLET | ORAL | 11 refills | Status: AC

## 2024-07-27 NOTE — Patient Instructions (Signed)
 Good to see you.  Continue Oxcarbazepine  600mg  every night and Topiramate  100mg : take 3 tablets every night  3. Start Nortriptyline  10mg : take 1 capsule every night. Update me on MyChart in a month on how you are feeling, we can increase dose if needed  4. You can take the Rizatriptan  as needed at onset of headache, do not take more than 3 a week  5. Follow-up in 4 months, call for any changes   Seizure Precautions: 1. If medication has been prescribed for you to prevent seizures, take it exactly as directed.  Do not stop taking the medicine without talking to your doctor first, even if you have not had a seizure in a long time.   2. Avoid activities in which a seizure would cause danger to yourself or to others.  Don't operate dangerous machinery, swim alone, or climb in high or dangerous places, such as on ladders, roofs, or girders.  Do not drive unless your doctor says you may.  3. If you have any warning that you may have a seizure, lay down in a safe place where you can't hurt yourself.    4.  No driving for 6 months from last seizure, as per [place]  state law.   Please refer to the following link on the Epilepsy Foundation of America's website for more information: http://www.epilepsyfoundation.org/answerplace/Social/driving/drivingu.cfm   5.  Maintain good sleep hygiene. Avoid alcohol.  6.  Notify your neurology if you are planning pregnancy or if you become pregnant.  7.  Contact your doctor if you have any problems that may be related to the medicine you are taking.  8.  Call 911 and bring the patient back to the ED if:        A.  The seizure lasts longer than 5 minutes.       B.  The patient doesn't awaken shortly after the seizure  C.  The patient has new problems such as difficulty seeing, speaking or moving  D.  The patient was injured during the seizure  E.  The patient has a temperature over 102 F (39C)  F.  The patient vomited and now is having trouble  breathing

## 2024-07-27 NOTE — Progress Notes (Signed)
 "  NEUROLOGY FOLLOW UP OFFICE NOTE  JANNEL LYNNE [id number] 25-Nov-1990  Discussed the use of AI scribe software for clinical note transcription with the patient, who gave verbal consent to proceed.  History of Present Illness I had the pleasure of seeing Adalyn Pennock in follow-up in the neurology clinic on 33/13/2026.  The patient was last seen a year ago for bilateral tonic-clonic epilepsy and migraines. She is alone in the office today.  Records and images were personally reviewed where available.  She is on Oxcarbazepine  600mg  at bedtime and Topiramate  300mg  at bedtime for seizure prophylaxis. She reported more migraines on last visit and was started on low dose nortriptyline  10mg  at bedtime. She was lost to follow-up and ran out of refills, she does not recall any side effects.   She denies any seizures since 2020. She denies any staring/unresponsive episodes, gaps in time, olfactory/gustatory hallucinations, focal numbness/tingling/weakness, myoclonic jerks. Friends have observed her moving a lot in her sleep, though not convulsing. She sometimes feels restless during sleep. No tongue bite, incontinence, muscle soreness.   She has been experiencing frequent headaches, which she attributes to stress and overthinking. She has had a headache for the past three days, located at the temples, no nausea/vomiting, photo/phonophobia. For relief, she takes ibuprofen  and occasionally uses 'pain quill' or BC powder if available. She usually gets around 5 hours of sleep, waking up at 3am unable to sleep. Sometimes she naps at 11am. Her mood is described as 'iffy,' and she has recently started seeing a therapist. She lives alone. She does not drive.    History on Initial Assessment 12/02/2017: This is a pleasant 34 year old right-handed woman with a history of seizures since 2013. Records from [facility] and [facility] were reviewed and will be summarized here, she is depressed in the office today and is a  poor historian. She initially had nocturnal seizures with described diffuse rhythmic jerking with eye rolling backwards. She had an EEG in 06/2012 reporting left frontal sharps. There is also note of episodes in 2012 where she would have staring spells lasting around 2 minutes with associated diaphoresis and post-ictal confusion with violence usually at night or early in the morning. Both seizure types were associated with tongue bite and urinary incontinence. There is report of deja vu, and she reports tingling in both hands during the stare offs. She lives with her mother but is mostly by herself at home during the day, and feels she has the stare off episodes around twice a week. She was initially started on Keppra  which caused mood issues, then switched to Topamax . It appears she initially had mental slowing on Topamax , but has been taking 300mg  qhs since 2015. She had seen Dr. Margaret at [facility] last year and was started on Vimpat  which she stopped again due to side effects. She reports the last nocturnal convulsion was in June 2018 when she woke up feeling weird with her tongue sore and with a headache. She was given a headache cocktail but was also noted to be very anxious and depressed. She was admitted for involuntary commitment at [facility] in July 2018 for suicidal ideation. She currently denies any suicidal ideation but continues to report significant depression. She reports body twitches in her arms or legs. She has headaches on a daily basis, taking an average of 2 Tylenol  or BC powder daily. She feels it is due to overthinking or depression. She has sleep difficulties with interrupted 5-6 hours of sleep at  night. She feels fatigued and drowsy during the day. She has dizziness when standing. Vision is occasionally blurred when watching TV. She has noticed swallowing difficulties. She notices her right foot gets numb frequently and her balance is off. No falls. 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    Epilepsy Risk Factors:  Her maternal great grandmother and great aunt had seizures. Otherwise she had a normal birth and early development.  There is no history of febrile convulsions, CNS infections such as meningitis/encephalitis, significant traumatic brain injury, neurosurgical procedures.   Prior AEDs: Keppra , Vimpat  Laboratory Data:  EEGs: EEG in 06/2012 reported as abnormal secondary to focal sharp wave activity in the left frontal region MRI: I personally reviewed MRI brain without contrast done 06/2012 which was degraded by motion. Lateral ventricles are mildly prominent in size with a slightly rounded appearance, no transependymal CSF flow. Hippocampi appear symmetric with no abnormal signal seen.  PAST MEDICAL HISTORY: Past Medical History:  Diagnosis Date   Acne vulgaris    ASCUS with positive high risk HPV cervical 09/17/2022   Depression 04/29/2017   Epilepsy (HCC)    Migraine    Seizures (HCC) 07/18/2016   Sleep disturbance 04/29/2017   Vitamin D  deficiency 06/2020    MEDICATIONS: Medications Ordered Prior to Encounter[1]  ALLERGIES: Allergies[2]  FAMILY HISTORY: Family History  Problem Relation Age of Onset   Hypertension Mother    Prostate cancer Father    Pancreatic cancer Father    Seizures Other    Breast cancer Neg Hx    Colon cancer Neg Hx     SOCIAL HISTORY: Social History   Socioeconomic History   Marital status: Single    Spouse name: Not on file   Number of children: 0   Years of education: 12th   Highest education level: 12th grade  Occupational History    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[employer] [place]  Tobacco Use   Smoking status: Every Day    Current packs/day: 0.25    Average packs/day: 0.3 packs/day for 4.0 years (1.0 ttl pk-yrs)    Types: Cigarettes   Smokeless tobacco: Never   Tobacco comments:    6-7 cigarettes per day  Vaping Use   Vaping status: Never Used   Substance and Sexual Activity   Alcohol use: No   Drug use: Yes    Types: Marijuana    Comment: trying to quit   Sexual activity: Yes    Birth control/protection: Condom  Other Topics Concern   Not on file  Social History Narrative   Patient lives home alone    Right handed    07/24/2023: 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 and will graduate in 2026   Social Drivers of Health   Tobacco Use: High Risk (07/05/2024)   Received from [facility]   Patient History    Smoking Tobacco Use: Every Day    Smokeless Tobacco Use: Never    Passive Exposure: Not on file  Financial Resource Strain: Low Risk (07/24/2023)   Overall Financial Resource Strain (CARDIA)    Difficulty of Paying Living Expenses: Not very hard  Food Insecurity: No Food Insecurity (07/24/2023)   Hunger Vital Sign    Worried About Running Out of Food in the Last Year: Never true    Ran Out of Food in the Last Year: Never true  Transportation Needs: No Transportation Needs (07/24/2023)   PRAPARE - Transportation  Lack of Transportation (Medical): No    Lack of Transportation (Non-Medical): No  Physical Activity: Insufficiently Active (07/24/2023)   Exercise Vital Sign    Days of Exercise per Week: 7 days    Minutes of Exercise per Session: 20 min  Stress: No Stress Concern Present (07/24/2023)   [employer] of Occupational Health - Occupational Stress Questionnaire    Feeling of Stress : Not at all  Social Connections: Moderately Isolated (07/24/2023)   Social Connection and Isolation Panel    Frequency of Communication with Friends and Family: More than three times a week    Frequency of Social Gatherings with Friends and Family: Twice a week    Attends Religious Services: 1 to 4 times per year    Active Member of [employer] or Organizations: No    Attends Banker Meetings: Never    Marital Status: Never married  Intimate Partner Violence: Not At Risk (07/24/2023)   Humiliation, Afraid,  Rape, and Kick questionnaire    Fear of Current or Ex-Partner: No    Emotionally Abused: No    Physically Abused: No    Sexually Abused: No  Depression (PHQ2-9): Low Risk (06/22/2024)   Depression (PHQ2-9)    PHQ-2 Score: 0  Alcohol Screen: Low Risk (07/24/2023)   Alcohol Screen    Last Alcohol Screening Score (AUDIT): 0  Housing: Low Risk (07/24/2023)   Housing Stability Vital Sign    Unable to Pay for Housing in the Last Year: No    Number of Times Moved in the Last Year: 1    Homeless in the Last Year: No  Utilities: Not At Risk (07/24/2023)   AHC Utilities    Threatened with loss of utilities: No  Health Literacy: Adequate Health Literacy (07/24/2023)   B1300 Health Literacy    Frequency of need for help with medical instructions: Never     PHYSICAL EXAM: Vitals:   07/27/24 1224  BP: 117/80  Pulse: 100  SpO2: 98%   General: No acute distress Head:  Normocephalic/atraumatic Skin/Extremities: No rash, no edema Neurological Exam: alert and awake. No aphasia or dysarthria. Fund of knowledge is appropriate.   Attention and concentration are normal.   Cranial nerves: Pupils equal, round. Extraocular movements intact with no nystagmus. Visual fields full.  No facial asymmetry.  Motor: Bulk and tone normal, muscle strength 5/5 throughout with no pronator drift.   Finger to nose testing intact.  Gait narrow-based and steady, able to tandem walk adequately.  Romberg negative.   IMPRESSION: This is a pleasant 34 yo RH woman with a history of depression, migraines, and seizures. 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 Prior EEG in 2013 reported left frontal sharp waves, MRI degraded by motion but no acute changes seen. No seizures since 2020 on Oxcarbazepine  600mg  at bedtime and Topiramate  300mg  at bedtime. She reports restless sleep sometimes, as well as frequent headaches. Restart Nortriptyline  10mg  at bedtime, we may  uptitrate as tolerated. She has prn Maxalt  for rescue. Continue follow-up with Behavioral Health. She does not drive. She was advised to keep a calendar of her headaches and try to ask friends to take a video of sleep movements. Follow-up in 4 months, call for any changes.    Thank you for allowing me to participate in her care.  Please do not hesitate to call for any questions or concerns.    Darice Shivers, M.D.   CC: Folashade Paseda, FNP           [  1]  Current Outpatient Medications on File Prior to Visit  Medication Sig Dispense Refill   ipratropium (ATROVENT ) 0.06 % nasal spray Place 2 sprays into both nostrils 3 (three) times daily. As needed for nasal congestion, runny nose (Patient not taking: Reported on 10/08/2023) 15 mL 1   levocetirizine (XYZAL ) 5 MG tablet Take 1 tablet (5 mg total) by mouth every evening. (Patient not taking: Reported on 10/08/2023) 90 tablet 1   nortriptyline  (PAMELOR ) 10 MG capsule Take 1 capsule (10 mg total) by mouth at bedtime. (Patient not taking: Reported on 10/08/2023) 30 capsule 11   oxcarbazepine  (TRILEPTAL ) 600 MG tablet Take 1 tablet every night 30 tablet 11   rizatriptan  (MAXALT -MLT) 10 MG disintegrating tablet Take 1 tablet at onset of migraine. Do not take more than 3 a week. (Patient not taking: Reported on 10/08/2023) 9 tablet 11   topiramate  (TOPAMAX ) 100 MG tablet Take 3 tablets every night 90 tablet 11   No current facility-administered medications on file prior to visit.  [2] No Known Allergies  "

## 2024-07-29 ENCOUNTER — Ambulatory Visit (INDEPENDENT_AMBULATORY_CARE_PROVIDER_SITE_OTHER): Payer: Self-pay | Admitting: *Deleted

## 2024-07-29 VITALS — Ht 67.0 in | Wt 154.0 lb

## 2024-07-29 DIAGNOSIS — Z0000 Encounter for general adult medical examination without abnormal findings: Secondary | ICD-10-CM

## 2024-07-29 NOTE — Patient Instructions (Signed)
 Lisa Shaw,  Thank you for taking the time for your Medicare Wellness Visit. I appreciate your continued commitment to your health goals. Please review the care plan we discussed, and feel free to reach out if I can assist you further.  Please note that Annual Wellness Visits do not include a physical exam. Some assessments may be limited, especially if the visit was conducted virtually. If needed, we may recommend an in-person follow-up with your provider.  Ongoing Care Seeing your primary care provider every 3 to 6 months helps us  monitor your health and provide consistent, personalized care.  Referrals If a referral was made during today's visit and you haven't received any updates within two weeks, please contact the referred provider directly to check on the status.  Recommended Screenings:  Health Maintenance  Topic Date Due   Pneumococcal Vaccine (1 of 2 - PCV) Never done   Hepatitis B Vaccine (1 of 3 - 19+ 3-dose series) Never done   COVID-19 Vaccine (4 - 2025-26 season) 03/15/2024   Pap Smear  09/18/2024   Medicare Annual Wellness Visit  07/29/2025   DTaP/Tdap/Td vaccine (3 - Td or Tdap) 10/07/2032   Flu Shot  Completed   HPV Vaccine (No Doses Required) Completed   Hepatitis C Screening  Completed   HIV Screening  Completed   Meningitis B Vaccine  Aged Out       07/29/2024    8:42 AM  Advanced Directives  Does Patient Have a Medical Advance Directive? No  Would patient like information on creating a medical advance directive? No - Patient declined    Vision: Annual vision screenings are recommended for early detection of glaucoma, cataracts, and diabetic retinopathy. These exams can also reveal signs of chronic conditions such as diabetes and high blood pressure.  Dental: Annual dental screenings help detect early signs of oral cancer, gum disease, and other conditions linked to overall health, including heart disease and diabetes.  Please see the attached  documents for additional preventive care recommendations.    Lisa Shaw , Thank you for taking time to come for your Medicare Wellness Visit. I appreciate your ongoing commitment to your health goals. Please review the following plan we discussed and let me know if I can assist you in the future.   Screening recommendations/referrals Recommended yearly ophthalmology/optometry visit for glaucoma screening and checkup Recommended yearly dental visit for hygiene and checkup  Vaccinations: Influenza vaccine: Pneumococcal vaccine:  Tdap vaccine:  Shingles vaccine:       Preventive Care  Female Preventive care refers to lifestyle choices and visits with your health care provider that can promote health and wellness. What does preventive care include? A yearly physical exam. This is also called an annual well check. Dental exams once or twice a year. Routine eye exams. Ask your health care provider how often you should have your eyes checked. Personal lifestyle choices, including: Daily care of your teeth and gums. Regular physical activity. Eating a healthy diet. Avoiding tobacco and drug use. Limiting alcohol use. Practicing safe sex. Taking low-dose aspirin every day. Taking vitamin and mineral supplements as recommended by your health care provider. What happens during an annual well check? The services and screenings done by your health care provider during your annual well check will depend on your age, overall health, lifestyle risk factors, and family history of disease. Counseling  Your health care provider may ask you questions about your: Alcohol use. Tobacco use. Drug use. Emotional well-being. Home and relationship well-being.  Sexual activity. Eating habits. History of falls. Memory and ability to understand (cognition). Work and work astronomer. Reproductive health. Screening  You may have the following tests or measurements: Height, weight, and BMI. Blood  pressure. Lipid and cholesterol levels. These may be checked every 5 years, or more frequently if you are over 66 years old. Skin check. Lung cancer screening. You may have this screening every year starting at age 7 if you have a 30-pack-year history of smoking and currently smoke or have quit within the past 15 years. Fecal occult blood test (FOBT) of the stool. You may have this test every year starting at age 66. Flexible sigmoidoscopy or colonoscopy. You may have a sigmoidoscopy every 5 years or a colonoscopy every 10 years starting at age 83. Hepatitis C blood test. Hepatitis B blood test. Sexually transmitted disease (STD) testing. Diabetes screening. This is done by checking your blood sugar (glucose) after you have not eaten for a while (fasting). You may have this done every 1-3 years. Bone density scan. This is done to screen for osteoporosis. You may have this done starting at age 41. Mammogram. This may be done every 1-2 years. Talk to your health care provider about how often you should have regular mammograms. Talk with your health care provider about your test results, treatment options, and if necessary, the need for more tests. Vaccines  Your health care provider may recommend certain vaccines, such as: Influenza vaccine. This is recommended every year. Tetanus, diphtheria, and acellular pertussis (Tdap, Td) vaccine. You may need a Td booster every 10 years. Zoster vaccine. You may need this after age 80. Pneumococcal 13-valent conjugate (PCV13) vaccine. One dose is recommended after age 1. Pneumococcal polysaccharide (PPSV23) vaccine. One dose is recommended after age 52. Talk to your health care provider about which screenings and vaccines you need and how often you need them. This information is not intended to replace advice given to you by your health care provider. Make sure you discuss any questions you have with your health care provider. Document Released:  07/28/2015 Document Revised: 03/20/2016 Document Reviewed: 05/02/2015 Elsevier Interactive Patient Education  2017 [employer].  Fall Prevention in the Home Falls can cause injuries. They can happen to people of all ages. There are many things you can do to make your home safe and to help prevent falls. What can I do on the outside of my home? Regularly fix the edges of walkways and driveways and fix any cracks. Remove anything that might make you trip as you walk through a door, such as a raised step or threshold. Trim any bushes or trees on the path to your home. Use bright outdoor lighting. Clear any walking paths of anything that might make someone trip, such as rocks or tools. Regularly check to see if handrails are loose or broken. Make sure that both sides of any steps have handrails. Any raised decks and porches should have guardrails on the edges. Have any leaves, snow, or ice cleared regularly. Use sand or salt on walking paths during winter. Clean up any spills in your garage right away. This includes oil or grease spills. What can I do in the bathroom? Use night lights. Install grab bars by the toilet and in the tub and shower. Do not use towel bars as grab bars. Use non-skid mats or decals in the tub or shower. If you need to sit down in the shower, use a plastic, non-slip stool. Keep the floor dry. Clean up  any water that spills on the floor as soon as it happens. Remove soap buildup in the tub or shower regularly. Attach bath mats securely with double-sided non-slip rug tape. Do not have throw rugs and other things on the floor that can make you trip. What can I do in the bedroom? Use night lights. Make sure that you have a light by your bed that is easy to reach. Do not use any sheets or blankets that are too big for your bed. They should not hang down onto the floor. Have a firm chair that has side arms. You can use this for support while you get dressed. Do not have  throw rugs and other things on the floor that can make you trip. What can I do in the kitchen? Clean up any spills right away. Avoid walking on wet floors. Keep items that you use a lot in easy-to-reach places. If you need to reach something above you, use a strong step stool that has a grab bar. Keep electrical cords out of the way. Do not use floor polish or wax that makes floors slippery. If you must use wax, use non-skid floor wax. Do not have throw rugs and other things on the floor that can make you trip. What can I do with my stairs? Do not leave any items on the stairs. Make sure that there are handrails on both sides of the stairs and use them. Fix handrails that are broken or loose. Make sure that handrails are as long as the stairways. Check any carpeting to make sure that it is firmly attached to the stairs. Fix any carpet that is loose or worn. Avoid having throw rugs at the top or bottom of the stairs. If you do have throw rugs, attach them to the floor with carpet tape. Make sure that you have a light switch at the top of the stairs and the bottom of the stairs. If you do not have them, ask someone to add them for you. What else can I do to help prevent falls? Wear shoes that: Do not have high heels. Have rubber bottoms. Are comfortable and fit you well. Are closed at the toe. Do not wear sandals. If you use a stepladder: Make sure that it is fully opened. Do not climb a closed stepladder. Make sure that both sides of the stepladder are locked into place. Ask someone to hold it for you, if possible. Clearly mark and make sure that you can see: Any grab bars or handrails. First and last steps. Where the edge of each step is. Use tools that help you move around (mobility aids) if they are needed. These include: Canes. Walkers. Scooters. Crutches. Turn on the lights when you go into a dark area. Replace any light bulbs as soon as they burn out. Set up your furniture so  you have a clear path. Avoid moving your furniture around. If any of your floors are uneven, fix them. If there are any pets around you, be aware of where they are. Review your medicines with your doctor. Some medicines can make you feel dizzy. This can increase your chance of falling. Ask your doctor what other things that you can do to help prevent falls. This information is not intended to replace advice given to you by your health care provider. Make sure you discuss any questions you have with your health care provider. Document Released: 04/27/2009 Document Revised: 12/07/2015 Document Reviewed: 08/05/2014 Elsevier Interactive Patient Education  2017  [employer].

## 2024-07-29 NOTE — Progress Notes (Signed)
 "  Chief Complaint  Patient presents with   Medicare Wellness     Subjective:   Lisa Shaw is a 34 y.o. female who presents for a Medicare Annual Wellness Visit.  No voiced or noted concerns at this time Patient advised to keep follow-up appointment with PCP (09-20-2024)   Visit info / Clinical Intake: Medicare Wellness Visit Type:: Subsequent Annual Wellness Visit Persons participating in visit and providing information:: patient Medicare Wellness Visit Mode:: Video Since this visit was completed virtually, some vitals may be partially provided or unavailable. Missing vitals are due to the limitations of the virtual format.: Unable to obtain vitals - no equipment If Telephone or Video please confirm:: I connected with patient using audio/video enable telemedicine. I verified patient identity with two identifiers, discussed telehealth limitations, and patient agreed to proceed. Patient Location:: home Provider Location:: home Interpreter Needed?: No Pre-visit prep was completed: no AWV questionnaire completed by patient prior to visit?: yes Date:: 07/29/24 Living arrangements:: (!) lives alone Patient's Overall Health Status Rating: good Typical amount of pain: some Does pain affect daily life?: no  Dietary Habits and Nutritional Risks How many meals a day?: 2 Eats fruit and vegetables daily?: yes Most meals are obtained by: preparing own meals; eating out; having others provide food In the last 2 weeks, have you had any of the following?: none Diabetic:: no  Functional Status Activities of Daily Living (to include ambulation/medication): Independent Ambulation: Independent Medication Administration: Independent Home Management (perform basic housework or laundry): Independent Manage your own finances?: yes Primary transportation is: family / friends Concerns about vision?: no *vision screening is required for WTM* Concerns about hearing?: no  Fall  Screening Falls in the past year?: 0 Number of falls in past year: 0 Was there an injury with Fall?: 0 Fall Risk Category Calculator: 0 Patient Fall Risk Level: Low Fall Risk  Fall Risk Patient at Risk for Falls Due to: No Fall Risks Fall risk Follow up: Falls evaluation completed; Education provided; Falls prevention discussed  Home and Transportation Safety: All rugs have non-skid backing?: N/A, no rugs All stairs or steps have railings?: N/A, no stairs Grab bars in the bathtub or shower?: yes Have non-skid surface in bathtub or shower?: yes Good home lighting?: yes Regular seat belt use?: yes Hospital stays in the last year:: no  Cognitive Assessment Difficulty concentrating, remembering, or making decisions? : no Will 6CIT or Mini Cog be Completed: yes What year is it?: 0 points What month is it?: 0 points Give patient an address phrase to remember (5 components): Its very sunny outside today in January About what time is it?: 0 points Count backwards from 20 to 1: 0 points Say the months of the year in reverse: 0 points Repeat the address phrase from earlier: 0 points 6 CIT Score: 0 points  Advance Directives (For Healthcare) Does Patient Have a Medical Advance Directive?: No Would patient like information on creating a medical advance directive?: No - Patient declined  Reviewed/Updated  Reviewed/Updated: Reviewed All (Medical, Surgical, Family, Medications, Allergies, Care Teams, Patient Goals); Surgical History; Family History; Medications; Allergies; Care Teams; Patient Goals; Medical History    Allergies (verified) Patient has no known allergies.   Current Medications (verified) Outpatient Encounter Medications as of 07/29/2024  Medication Sig   ipratropium (ATROVENT ) 0.06 % nasal spray Place 2 sprays into both nostrils 3 (three) times daily. As needed for nasal congestion, runny nose   nortriptyline  (PAMELOR ) 10 MG capsule Take 1 capsule (10 mg  total) by mouth  at bedtime.   oxcarbazepine  (TRILEPTAL ) 600 MG tablet Take 1 tablet every night   rizatriptan  (MAXALT -MLT) 10 MG disintegrating tablet Take 1 tablet at onset of migraine. Do not take more than 3 a week.   topiramate  (TOPAMAX ) 100 MG tablet Take 3 tablets every night   levocetirizine (XYZAL ) 5 MG tablet Take 1 tablet (5 mg total) by mouth every evening. (Patient not taking: Reported on 07/29/2024)   No facility-administered encounter medications on file as of 07/29/2024.    History: Past Medical History:  Diagnosis Date   Acne vulgaris    ASCUS with positive high risk HPV cervical 09/17/2022   Depression 04/29/2017   Epilepsy (HCC)    Migraine    Seizures (HCC) 07/18/2016   Sleep disturbance 04/29/2017   Vitamin D  deficiency 06/2020   History reviewed. No pertinent surgical history. Family History  Problem Relation Age of Onset   Hypertension Mother    Prostate cancer Father    Pancreatic cancer Father    Seizures Other    Breast cancer Neg Hx    Colon cancer Neg Hx    Social History   Occupational History    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[employer]  Tobacco Use   Smoking status: Every Day    Current packs/day: 0.25    Average packs/day: 0.3 packs/day for 4.0 years (1.0 ttl pk-yrs)    Types: Cigarettes   Smokeless tobacco: Never   Tobacco comments:    6-7 cigarettes per day  Vaping Use   Vaping status: Never Used  Substance and Sexual Activity   Alcohol use: No   Drug use: Yes    Types: Marijuana    Comment: trying to quit   Sexual activity: Yes    Birth control/protection: Condom   Tobacco Counseling Ready to quit: Not Answered Counseling given: Not Answered Tobacco comments: 6-7 cigarettes per day  SDOH Screenings   Food Insecurity: No Food Insecurity (07/29/2024)  Housing: Unknown (07/29/2024)  Transportation Needs: No Transportation Needs (07/29/2024)  Utilities: Not At Risk (07/29/2024)  Alcohol Screen: Low Risk (07/24/2023)  Depression (PHQ2-9): Low Risk  (07/29/2024)  Financial Resource Strain: Low Risk (07/29/2024)  Physical Activity: Inactive (07/29/2024)  Social Connections: Socially Isolated (07/29/2024)  Stress: No Stress Concern Present (07/29/2024)  Tobacco Use: High Risk (07/29/2024)  Health Literacy: Adequate Health Literacy (07/29/2024)   See flowsheets for full screening details  Depression Screen PHQ 2 & 9 Depression Scale- Over the past 2 weeks, how often have you been bothered by any of the following problems? Little interest or pleasure in doing things: 0 Feeling down, depressed, or hopeless (PHQ Adolescent also includes...irritable): 0 PHQ-2 Total Score: 0 Trouble falling or staying asleep, or sleeping too much: 0 Feeling tired or having little energy: 1 Poor appetite or overeating (PHQ Adolescent also includes...weight loss): 0 Feeling bad about yourself - or that you are a failure or have let yourself or your family down: 0 Trouble concentrating on things, such as reading the newspaper or watching television (PHQ Adolescent also includes...like school work): 0 Moving or speaking so slowly that other people could have noticed. Or the opposite - being so fidgety or restless that you have been moving around a lot more than usual: 0 Thoughts that you would be better off dead, or of hurting yourself in some way: 0 PHQ-9 Total Score: 1 If you checked off any problems, how difficult have these problems made it for you to do your work, take care of  things at home, or get along with other people?: Not difficult at all     Goals Addressed             This Visit's Progress    Patient Stated       Working on goals to exceed             Objective:    Today's Vitals   07/29/24 0841  Weight: 154 lb (69.9 kg)  Height: 5' 7 (1.702 m)   Body mass index is 24.12 kg/m.  Hearing/Vision screen Hearing Screening - Comments:: No trouble hearing Vision Screening - Comments:: Not up to date  Education provided Immunizations  and Health Maintenance Health Maintenance  Topic Date Due   Pneumococcal Vaccine (1 of 2 - PCV) Never done   Hepatitis B Vaccines 19-59 Average Risk (1 of 3 - 19+ 3-dose series) Never done   COVID-19 Vaccine (4 - 2025-26 season) 03/15/2024   Cervical Cancer Screening (Pap smear)  09/18/2024   Medicare Annual Wellness (AWV)  07/29/2025   DTaP/Tdap/Td (3 - Td or Tdap) 10/07/2032   Influenza Vaccine  Completed   HPV VACCINES (No Doses Required) Completed   Hepatitis C Screening  Completed   HIV Screening  Completed   Meningococcal B Vaccine  Aged Out        Assessment/Plan:  This is a routine wellness examination for Lisa Shaw.  Patient Care Team: Paseda, Folashade R, FNP as PCP - General (Nurse Practitioner) Georjean Darice HERO, MD as Consulting Physician (Neurology)  I have personally reviewed and noted the following in the patients chart:   Medical and social history Use of alcohol, tobacco or illicit drugs  Current medications and supplements including opioid prescriptions. Functional ability and status Nutritional status Physical activity Advanced directives List of other physicians Hospitalizations, surgeries, and ER visits in previous 12 months Vitals Screenings to include cognitive, depression, and falls Referrals and appointments  No orders of the defined types were placed in this encounter.  In addition, I have reviewed and discussed with patient certain preventive protocols, quality metrics, and best practice recommendations. A written personalized care plan for preventive services as well as general preventive health recommendations were provided to patient.   Mliss Graff, LPN   [id number]/84/7973   Return in 1 year (on 07/29/2025).  After Visit Summary: (MyChart) Due to this being a telephonic visit, the after visit summary with patients personalized plan was offered to patient via MyChart   Nurse Notes:  "

## 2024-07-30 ENCOUNTER — Ambulatory Visit: Payer: Self-pay

## 2024-08-02 ENCOUNTER — Encounter: Payer: Self-pay | Admitting: Neurology

## 2024-08-02 ENCOUNTER — Telehealth: Payer: Self-pay

## 2024-08-16 ENCOUNTER — Ambulatory Visit: Payer: Self-pay | Admitting: Obstetrics

## 2024-09-01 ENCOUNTER — Ambulatory Visit: Payer: Self-pay | Admitting: Nurse Practitioner

## 2024-09-06 ENCOUNTER — Ambulatory Visit: Admitting: Obstetrics

## 2024-09-20 ENCOUNTER — Encounter: Payer: Self-pay | Admitting: Nurse Practitioner

## 2024-12-10 ENCOUNTER — Ambulatory Visit: Payer: Self-pay | Admitting: Neurology
